# Patient Record
Sex: Female | Born: 1990 | Race: Black or African American | Hispanic: No | Marital: Single | Smoking: Former smoker
Health system: Southern US, Community
[De-identification: ages and names within clinical notes are randomized; demographics above are authoritative.]

## PROBLEM LIST (undated history)

## (undated) ENCOUNTER — Inpatient Hospital Stay (HOSPITAL_COMMUNITY): Payer: Self-pay

## (undated) DIAGNOSIS — O24419 Gestational diabetes mellitus in pregnancy, unspecified control: Secondary | ICD-10-CM

## (undated) DIAGNOSIS — N760 Acute vaginitis: Secondary | ICD-10-CM

## (undated) DIAGNOSIS — B9689 Other specified bacterial agents as the cause of diseases classified elsewhere: Secondary | ICD-10-CM

## (undated) DIAGNOSIS — O139 Gestational [pregnancy-induced] hypertension without significant proteinuria, unspecified trimester: Secondary | ICD-10-CM

## (undated) DIAGNOSIS — A599 Trichomoniasis, unspecified: Secondary | ICD-10-CM

## (undated) HISTORY — PX: DILATION AND CURETTAGE OF UTERUS: SHX78

## (undated) HISTORY — DX: Gestational (pregnancy-induced) hypertension without significant proteinuria, unspecified trimester: O13.9

---
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
## 2008-02-04 ENCOUNTER — Emergency Department (HOSPITAL_COMMUNITY): Admission: EM | Admit: 2008-02-04 | Discharge: 2008-02-04 | Payer: Self-pay | Admitting: Family Medicine

## 2008-06-28 ENCOUNTER — Observation Stay (HOSPITAL_COMMUNITY): Admission: EM | Admit: 2008-06-28 | Discharge: 2008-06-28 | Payer: Self-pay | Admitting: Emergency Medicine

## 2009-07-21 ENCOUNTER — Inpatient Hospital Stay (HOSPITAL_COMMUNITY): Admission: AD | Admit: 2009-07-21 | Discharge: 2009-07-21 | Payer: Self-pay | Admitting: Obstetrics & Gynecology

## 2009-08-18 ENCOUNTER — Inpatient Hospital Stay (HOSPITAL_COMMUNITY): Admission: AD | Admit: 2009-08-18 | Discharge: 2009-08-18 | Payer: Self-pay | Admitting: Obstetrics & Gynecology

## 2009-11-06 ENCOUNTER — Emergency Department (HOSPITAL_COMMUNITY): Admission: EM | Admit: 2009-11-06 | Discharge: 2009-11-06 | Payer: Self-pay | Admitting: Emergency Medicine

## 2009-11-08 ENCOUNTER — Emergency Department (HOSPITAL_COMMUNITY): Admission: EM | Admit: 2009-11-08 | Discharge: 2009-11-08 | Payer: Self-pay | Admitting: Emergency Medicine

## 2010-09-12 ENCOUNTER — Inpatient Hospital Stay (HOSPITAL_COMMUNITY)
Admission: AD | Admit: 2010-09-12 | Discharge: 2010-09-12 | Disposition: A | Discharge status: Home / Self Care | Payer: Medicaid Other | Source: Ambulatory Visit | Attending: Obstetrics and Gynecology | Admitting: Obstetrics and Gynecology

## 2010-09-12 ENCOUNTER — Inpatient Hospital Stay (HOSPITAL_COMMUNITY): Payer: Medicaid Other

## 2010-09-12 DIAGNOSIS — O021 Missed abortion: Secondary | ICD-10-CM | POA: Insufficient documentation

## 2010-09-12 LAB — DIFFERENTIAL
Basophils Relative: 0 % (ref 0–1)
Eosinophils Absolute: 0.1 10*3/uL (ref 0.0–0.7)
Monocytes Absolute: 0.7 10*3/uL (ref 0.1–1.0)
Monocytes Relative: 10 % (ref 3–12)
Neutrophils Relative %: 59 % (ref 43–77)

## 2010-09-12 LAB — CBC
HCT: 37.3 % (ref 36.0–46.0)
Hemoglobin: 12.5 g/dL (ref 12.0–15.0)
MCH: 30.7 pg (ref 26.0–34.0)
MCV: 91.6 fL (ref 78.0–100.0)
RBC: 4.07 MIL/uL (ref 3.87–5.11)

## 2010-09-12 LAB — URINALYSIS, ROUTINE W REFLEX MICROSCOPIC
Leukocytes, UA: NEGATIVE
Nitrite: POSITIVE — AB
Specific Gravity, Urine: >1.03 — ABNORMAL HIGH (ref 1.005–1.030)
Urobilinogen, UA: 0.2 mg/dL (ref 0.0–1.0)

## 2010-09-12 LAB — WET PREP, GENITAL
Trich, Wet Prep: NONE SEEN
Yeast Wet Prep HPF POC: NONE SEEN

## 2010-09-12 LAB — URINE MICROSCOPIC-ADD ON

## 2010-09-13 LAB — URINE CULTURE
Colony Count: 65000
Culture  Setup Time: 201203171746

## 2010-09-14 LAB — URINALYSIS, ROUTINE W REFLEX MICROSCOPIC
Bilirubin Urine: NEGATIVE
Glucose, UA: NEGATIVE mg/dL
Specific Gravity, Urine: 1.02 (ref 1.005–1.030)

## 2010-09-14 LAB — CBC
HCT: 34.4 % — ABNORMAL LOW (ref 36.0–46.0)
MCV: 95.6 fL (ref 78.0–100.0)
Platelets: 305 10*3/uL (ref 150–400)
RDW: 11.7 % (ref 11.5–15.5)

## 2010-09-14 LAB — URINE MICROSCOPIC-ADD ON

## 2010-09-14 LAB — ABO/RH: ABO/RH(D): A POS

## 2010-09-14 LAB — URINE CULTURE

## 2010-09-14 LAB — WET PREP, GENITAL

## 2010-09-14 LAB — POCT PREGNANCY, URINE: Preg Test, Ur: POSITIVE

## 2010-09-14 LAB — HCG, QUANTITATIVE, PREGNANCY: hCG, Beta Chain, Quant, S: 106262 m[IU]/mL — ABNORMAL HIGH (ref <5)

## 2010-09-15 LAB — DIFFERENTIAL
Eosinophils Absolute: 0 10*3/uL (ref 0.0–0.7)
Eosinophils Relative: 0 % (ref 0–5)
Lymphs Abs: 1 10*3/uL (ref 0.7–4.0)
Monocytes Absolute: 0.6 10*3/uL (ref 0.1–1.0)
Monocytes Relative: 11 % (ref 3–12)

## 2010-09-15 LAB — POCT URINALYSIS DIP (DEVICE)
Bilirubin Urine: NEGATIVE
Glucose, UA: NEGATIVE mg/dL
Nitrite: NEGATIVE
pH: 5.5 (ref 5.0–8.0)

## 2010-09-15 LAB — CBC
HCT: 41 % (ref 36.0–46.0)
MCV: 93.4 fL (ref 78.0–100.0)
Platelets: 287 10*3/uL (ref 150–400)
WBC: 5.8 10*3/uL (ref 4.0–10.5)

## 2010-09-15 LAB — WET PREP, GENITAL: Yeast Wet Prep HPF POC: NONE SEEN

## 2010-09-15 LAB — GC/CHLAMYDIA PROBE AMP, GENITAL: Chlamydia, DNA Probe: NEGATIVE

## 2010-09-16 LAB — GC/CHLAMYDIA PROBE AMP, GENITAL: GC Probe Amp, Genital: NEGATIVE

## 2010-09-17 ENCOUNTER — Ambulatory Visit (HOSPITAL_COMMUNITY)
Admission: AD | Admit: 2010-09-17 | Discharge: 2010-09-17 | Disposition: A | Discharge status: Home / Self Care | Payer: Medicaid Other | Source: Ambulatory Visit | Attending: Obstetrics & Gynecology | Admitting: Obstetrics & Gynecology

## 2010-09-17 ENCOUNTER — Other Ambulatory Visit: Payer: Self-pay | Admitting: Obstetrics and Gynecology

## 2010-09-17 ENCOUNTER — Inpatient Hospital Stay (HOSPITAL_COMMUNITY): Payer: Medicaid Other

## 2010-09-17 DIAGNOSIS — R52 Pain, unspecified: Secondary | ICD-10-CM

## 2010-09-17 DIAGNOSIS — O034 Incomplete spontaneous abortion without complication: Secondary | ICD-10-CM | POA: Insufficient documentation

## 2010-09-17 LAB — URINE CULTURE

## 2010-09-17 LAB — CBC
HCT: 34.4 % — ABNORMAL LOW (ref 36.0–46.0)
HCT: 28.2 % — ABNORMAL LOW (ref 36.0–46.0)
Hemoglobin: 11.5 g/dL — ABNORMAL LOW (ref 12.0–15.0)
MCH: 30.3 pg (ref 26.0–34.0)
MCH: 30.2 pg (ref 26.0–34.0)
MCHC: 33.4 g/dL (ref 30.0–36.0)
MCHC: 33 g/dL (ref 30.0–36.0)
MCV: 90.5 fL (ref 78.0–100.0)
RDW: 12.7 % (ref 11.5–15.5)

## 2010-09-17 LAB — GC/CHLAMYDIA PROBE AMP, GENITAL
Chlamydia, DNA Probe: NEGATIVE
GC Probe Amp, Genital: NEGATIVE

## 2010-09-17 LAB — URINALYSIS, ROUTINE W REFLEX MICROSCOPIC
Glucose, UA: NEGATIVE mg/dL
Ketones, ur: NEGATIVE mg/dL
Protein, ur: NEGATIVE mg/dL
pH: 7 (ref 5.0–8.0)

## 2010-09-17 LAB — TYPE AND SCREEN: DAT, IgG: NEGATIVE

## 2010-09-17 LAB — URINE MICROSCOPIC-ADD ON

## 2010-09-17 LAB — WET PREP, GENITAL
Trich, Wet Prep: NONE SEEN
Yeast Wet Prep HPF POC: NONE SEEN

## 2010-09-22 NOTE — Op Note (Signed)
  NAME:  [name], [name] NO.:  [id number]  MEDICAL RECORD NO.:  [id number]           PATIENT TYPE:  O  LOCATION:  [facility]                          [facility]  PHYSICIAN:  Carrington Clamp, M.D. DATE OF BIRTH:  1990-10-22  DATE OF PROCEDURE:  09/17/2010 DATE OF DISCHARGE:                              OPERATIVE REPORT   PREOPERATIVE DIAGNOSIS:  Incomplete abortion.  POSTOPERATIVE DIAGNOSIS:  Incomplete abortion.  PROCEDURE:  Dilation and evacuation.  SURGEON:  Carrington Clamp, MD  ASSISTANT:  None.  ANESTHESIA:  General LMA.  FINDINGS:  9-week size with open cervix down to 6 weeks with good crie.  SPECIMENS:  Products of conception to Pathology.  ESTIMATED BLOOD LOSS:  Minimal.  INTRAVENOUS FLUIDS:  700 cc.  URINE OUTPUT:  Normal.  COMPLICATIONS:  None.  MEDICATIONS:  Methergine and Ancef.  COUNTS:  Correct x3.  TECHNIQUE:  After adequate anesthesia was achieved, the patient was prepped and draped in the usual sterile fashion in dorsal supine position with dorsal lithotomy position.  The bladder was emptied with a red rubber catheter and the speculum was placed in the vagina.  Single- tooth tenaculum was used to stabilize the cervix.  The cervix was re- dilated to allow a 10-mm curette to pass through.  Alternating suction and sharp curettage was then performed until good crie was obtained.  Once bleeding was very minimal and all products were removed, the patient was given a shot of Methergine and all instruments were withdrawn from the vagina.  The patient tolerated the procedure well and was returned to the recovery room in stable condition.     Carrington Clamp, M.D.     MH/MEDQ  D:  09/17/2010  T:  09/18/2010  Job:  [id number]  Electronically Signed by Carrington Clamp MD on 09/22/2010 07:19:56 PM

## 2010-10-12 LAB — HEPATIC FUNCTION PANEL
AST: 17 U/L (ref 0–37)
Albumin: 3.9 g/dL (ref 3.5–5.2)
Total Bilirubin: 0.7 mg/dL (ref 0.3–1.2)
Total Protein: 7.2 g/dL (ref 6.0–8.3)

## 2010-10-12 LAB — BASIC METABOLIC PANEL
CO2: 22 mEq/L (ref 19–32)
Calcium: 8.5 mg/dL (ref 8.4–10.5)
Creatinine, Ser: 0.74 mg/dL (ref 0.4–1.2)
GFR calc Af Amer: >60 mL/min (ref >60)
Sodium: 142 mEq/L (ref 135–145)

## 2010-10-12 LAB — ETHANOL: Alcohol, Ethyl (B): 167 mg/dL — ABNORMAL HIGH (ref 0–10)

## 2010-10-12 LAB — RAPID URINE DRUG SCREEN, HOSP PERFORMED
Amphetamines: NOT DETECTED
Barbiturates: NOT DETECTED
Opiates: NOT DETECTED
Tetrahydrocannabinol: POSITIVE — AB

## 2010-10-23 NOTE — Consult Note (Signed)
[name], [name]                ACCOUNT NO.:  [id number]  MEDICAL RECORD NO.:  [id number]           PATIENT TYPE:  O  LOCATION:  [facility]                         [facility]  PHYSICIAN:  Willadean Guyton H. Tenny Craw, MD     DATE OF BIRTH:  [birth year]  DATE OF CONSULTATION:  09/12/2010 DATE OF DISCHARGE:  09/12/2010                                CONSULTATION   CHIEF COMPLAINT:  Cramping.  HISTORY OF PRESENT ILLNESS:  A 20 year old G2, P0-0-1-0 who should be at 14 weeks and 4 days.  Estimated gestational age by last menstrual period of approximately June 02, 2010 presented to maternity admissions with a chief complaint of cramping.  Fetal heart tones could not be auscultated.  Therefore, a transvaginal ultrasound was performed which demonstrated an intrauterine pregnancy with a crown-rump length consistent with 7 weeks and 6 days, however, no fetal cardiac activity was visualized consistent with an intrauterine fetal demise. Additionally, a urinalysis demonstrated positive nitrites and was consistent with a urinary tract infection.  PHYSICAL EXAMINATION:  Afebrile.  Vital signs stable.  Alert and oriented x3 in no apparent distress.  Abdomen is soft, nontender, nondistended.  LABORATORY DATA White blood cell count 6.8, hemoglobin 12.5, platelets 333,000. Urinalysis, trace blood, negative protein, positive nitrites, negative leukocyte esterase, many squamous epithelium, 3-6 white blood cells, 0-2 red blood cells, many bacteria.  On wet prep, bacteria were too numerous to count and few clue cells were noted.  The patient's blood type is A+. Ultrasound results were as above.  ASSESSMENT AND PLAN:  This is a 20 year old G2, P1-0-1-1 at 7 weeks and 6 days with a missed abortion.  Ultrasound findings were discussed with the patient and her partner.  Management options were reviewed including expectant management, medical management, and surgical management.  The patient believes she wants  to proceed with expectant management, however, was interested in receiving prescriptions for medical management should her body not naturally proceed with expulsion of products of conception.  She was given a prescription for Bactrim double- strength for a urinary tract infection.  She was given prescription for misoprostol 800 mcg for per vagina one.  She was instructed that she could repeat the dose in 48 hours if she did not have resultant expulsion of products of conception.  She was also advised that it can take up to 2 weeks after using the misoprostol for completion of the miscarriage.  She was given a prescription for Vicodin 5 mg 1-2 p.o. q.4- 6 h. as needed for pain with cramping.  She was instructed that she could also take ibuprofen 600 mg every 6 hours as needed for cramping. She was given bleeding precautions with passage of products of conception.  She was originally scheduled to be seen in the office on Monday March 19.  She will reschedule this appointment and follow up within 2 weeks after passage of products of conception.  She was advised to follow up within 4 weeks if she has not had passage of products of conception regardless of whether she chooses to use the misoprostol or use expectant management.     Enrique Sack  Imagene Sheller, MD     KHR/MEDQ  D:  09/12/2010  T:  09/13/2010  Job:  [id number]  Electronically Signed by Waynard Reeds MD on 10/23/2010 10:44:52 AM

## 2010-11-10 ENCOUNTER — Inpatient Hospital Stay (HOSPITAL_COMMUNITY)
Admission: AD | Admit: 2010-11-10 | Discharge: 2010-11-10 | Disposition: A | Discharge status: Home / Self Care | Payer: Medicaid Other | Source: Ambulatory Visit | Attending: Family Medicine | Admitting: Family Medicine

## 2010-11-10 DIAGNOSIS — N949 Unspecified condition associated with female genital organs and menstrual cycle: Secondary | ICD-10-CM

## 2010-11-10 DIAGNOSIS — B373 Candidiasis of vulva and vagina: Secondary | ICD-10-CM

## 2010-11-10 DIAGNOSIS — B3731 Acute candidiasis of vulva and vagina: Secondary | ICD-10-CM | POA: Insufficient documentation

## 2010-11-10 LAB — WET PREP, GENITAL
Clue Cells Wet Prep HPF POC: NONE SEEN
Trich, Wet Prep: NONE SEEN
Yeast Wet Prep HPF POC: NONE SEEN

## 2010-11-10 LAB — POCT PREGNANCY, URINE: Preg Test, Ur: NEGATIVE

## 2010-11-10 LAB — GC/CHLAMYDIA PROBE AMP, GENITAL: GC Probe Amp, Genital: NEGATIVE

## 2010-11-10 NOTE — H&P (Signed)
NAME:  [name], SLEE NO.:  [id number]   MEDICAL RECORD NO.:  [id number]          PATIENT TYPE:  EMS   LOCATION:  ED                           FACILITY:  [facility]   PHYSICIAN:  Ladell Pier, M.D.   DATE OF BIRTH:  July 02, 1990   DATE OF ADMISSION:  06/28/2008  DATE OF DISCHARGE:                              HISTORY & PHYSICAL   CHIEF COMPLAINT:  Altered mental status secondary to alcohol overdose.   HISTORY OF PRESENT ILLNESS:  The patient is a 20 year old African  American female.  History was obtained from her mother since the patient  was somnolent.  The patient was at home.  She became very upset with her  boyfriend.  She does not normally drink alcohol.  She drank a full  bottle of gin.  She just turned the bottle up to her head.  After that  she became very shaky and confused.  Because of that they took her to  the emergency room where she became very combative.  It took 10 people  to hold her down.  She was given 20 of Geodon and 2 mg of Versed.  The  patient is now very sedated and somnolent.  Asked to admit the patient  for observation.   PAST MEDICAL HISTORY:  None.   FAMILY HISTORY:  Noncontributory.   SOCIAL HISTORY:  The patient does smokes about three cigarettes per day.  Per drug screen she uses marijuana.  Normally no alcohol use.  She has a  boyfriend.  She is single.  She works at [employer].  No IV  drug use.  No children.   MEDICATIONS:  None.   ALLERGIES:  None.   REVIEW OF SYSTEMS:  Unable to obtain secondary to patient being  somnolent.   PHYSICAL EXAMINATION:  VITAL SIGNS:  Temperature 97, blood pressure  102/56, pulse of 99, respirations 16, pulse ox 100%.  GENERAL:  Patient  lying on the stretcher.  Does not seem to be in any acute distress.  She  moves her extremities but does not talk.  She groans to sternal rub.  HEENT:  Normocephalic, atraumatic.  Pupils reactive to light.  Throat is  unable to view secondary to the  patient would not open her mouth.  CARDIOVASCULAR:  Regular rate and rhythm.  LUNGS:  Clear bilaterally.  ABDOMEN:  Positive bowel sounds.  EXTREMITIES:  Without edema.  NEUROLOGIC:  The patient is able to move all extremities on neurologic  exam.   LABORATORY DATA:  Positive for marijuana.  Alcohol of 167.  Sodium 142,  chloride 109, CO2 22, BUN 25, creatinine 0.74, glucose 85.   ASSESSMENT/PLAN:  1. Alcohol intoxication/overdose.  2. Sedation secondary to medications.  3. Hypokalemia.  4. Marijuana use.   PLAN:  Will admit the patient to the step-down unit.  Will monitor her  for the next 24 hours until she recovers from her alcohol overdose.  The  patient will be evaluated by psychiatry and then can be discharged home  if cleared by psychiatry.      Ladell Pier, M.D.  Electronically  Signed     NJ/MEDQ  D:  06/28/2008  T:  06/28/2008  Job:  [id number]

## 2010-11-10 NOTE — Discharge Summary (Signed)
NAME:  [name], [name] NO.:  [id number]   MEDICAL RECORD NO.:  [id number]          PATIENT TYPE:  OBV   LOCATION:  0102                         FACILITY:  [facility]   PHYSICIAN:  Ladell Pier, M.D.   DATE OF BIRTH:  [birth year]   DATE OF ADMISSION:  06/28/2008  DATE OF DISCHARGE:                               DISCHARGE SUMMARY   ADDENDUM   The patient was not admitted to the hospital.  The patient left the  hospital from the emergency room.   DISCHARGE DIAGNOSES:  1. Alcohol overdose.  2. Altered mental status secondary to Geodon and Versed used for      agitation.  3. Hypokalemia.   DISCHARGE MEDICATIONS:  None.   FOLLOW-UP APPOINTMENTS:  Patient to follow up with primary care doctor  in 1 week.   HISTORY OF PRESENT ILLNESS:  As per the history and physical done, the  patient is a 20 year old African American female.  She was at home.  She  had a fight with her boyfriend.  She drank a bottle of gin.  The patient  stated that she was drinking because her boyfriend had left and to  celebrate the new year.   PAST MEDICAL HISTORY FAMILY HISTORY SOCIAL HISTORY MEDS ALLERGIES REVIEW  OF SYSTEM AND PHYSICAL EXAM:  See the admission H and P.   PHYSICAL EXAM ON DISCHARGE:  Temperature 97, blood pressure 102/56,  pulse 99, respirations 16, pulse of 100% on room air.  GENERAL:  The  patient is sitting up on stretcher, does not seem to be in any acute  distress.  HEENT:  Normocephalic, atraumatic.  Pupils reactive to light.  Throat  without erythema.  CARDIOVASCULAR:  Regular rate and rhythm.  LUNGS:  Clear bilaterally.  ABDOMEN:  Positive bowel sounds.  EXTREMITIES:  No edema.   HOSPITAL COURSE:  Alcohol intoxication/overdose/altered mental status:  We were asked to admit the patient secondary to alcohol overdose.  The  patient received Geodon and Versed in the ER and was sleepy for a few  hours.  After the alcohol wore off the patient woke up.  She was  alert,  oriented.  On talking with the patient, she stated that she was not  trying to hurt herself.  She was just drinking alcohol to celebrate the  new year.  She was mad at her boyfriend for leaving but she does not  think that is why she drank excess alcohol.  The patient will be  discharged home and she will follow up with her primary care doctor  within a week.   DISCHARGE LABS:  Alk phos 73, AST 17, ALT 14, albumin 3.9, sodium 130,  142, potassium 4.2, chloride 109, CO2 22,, glucose 85, BUN 5,  creatinine 0.74, calcium 8.5.  Alcohol level at admission was 167.  UDS  positive for tetrahydrocannabinol.  Urine pregnancy negative.      Ladell Pier, M.D.  Electronically Signed     NJ/MEDQ  D:  06/28/2008  T:  06/28/2008  Job:  [id number]

## 2012-09-05 ENCOUNTER — Encounter (HOSPITAL_COMMUNITY): Payer: Self-pay | Admitting: Obstetrics and Gynecology

## 2012-09-05 ENCOUNTER — Inpatient Hospital Stay (HOSPITAL_COMMUNITY)
Admission: AD | Admit: 2012-09-05 | Discharge: 2012-09-05 | Disposition: A | Discharge status: Home / Self Care | Payer: Medicaid Other | Source: Ambulatory Visit | Attending: Obstetrics & Gynecology | Admitting: Obstetrics & Gynecology

## 2012-09-05 DIAGNOSIS — R1032 Left lower quadrant pain: Secondary | ICD-10-CM

## 2012-09-05 DIAGNOSIS — A5901 Trichomonal vulvovaginitis: Secondary | ICD-10-CM | POA: Insufficient documentation

## 2012-09-05 DIAGNOSIS — R109 Unspecified abdominal pain: Secondary | ICD-10-CM | POA: Insufficient documentation

## 2012-09-05 HISTORY — DX: Trichomoniasis, unspecified: A59.9

## 2012-09-05 LAB — CBC WITH DIFFERENTIAL/PLATELET
Basophils Absolute: 0.1 10*3/uL (ref 0.0–0.1)
Basophils Relative: 1 % (ref 0–1)
Lymphocytes Relative: 40 % (ref 12–46)
MCHC: 33.7 g/dL (ref 30.0–36.0)
Neutro Abs: 2.8 10*3/uL (ref 1.7–7.7)
Neutrophils Relative %: 47 % (ref 43–77)
Platelets: 324 10*3/uL (ref 150–400)
RDW: 11.6 % (ref 11.5–15.5)
WBC: 5.9 10*3/uL (ref 4.0–10.5)

## 2012-09-05 LAB — URINALYSIS, ROUTINE W REFLEX MICROSCOPIC
Specific Gravity, Urine: >1.03 — ABNORMAL HIGH (ref 1.005–1.030)
Urobilinogen, UA: 2 mg/dL — ABNORMAL HIGH (ref 0.0–1.0)

## 2012-09-05 LAB — WET PREP, GENITAL: Yeast Wet Prep HPF POC: NONE SEEN

## 2012-09-05 LAB — URINE MICROSCOPIC-ADD ON

## 2012-09-05 LAB — POCT PREGNANCY, URINE: Preg Test, Ur: NEGATIVE

## 2012-09-05 MED ORDER — IBUPROFEN 600 MG PO TABS: 600.0000 mg | ORAL_TABLET | Freq: Once | ORAL | Status: DC

## 2012-09-05 MED ORDER — METRONIDAZOLE 500 MG PO TABS: 500.0000 mg | ORAL_TABLET | Freq: Two times a day (BID) | ORAL | Status: DC

## 2012-09-05 MED ORDER — KETOROLAC TROMETHAMINE 60 MG/2ML IM SOLN
30.0000 mg | Freq: Once | INTRAMUSCULAR | Status: AC
  Administered 2012-09-05: 30 mg via INTRAMUSCULAR
  Filled 2012-09-05: qty 2

## 2012-09-05 NOTE — MAU Note (Signed)
Can't keep nothing down, nauseated.  Having sharp pains in lower abd- same as when here before.

## 2012-09-05 NOTE — MAU Note (Signed)
Pt presents to MAU with complaints of Nausea and lower abdominal pain. Says she is unable to eat because it makes her nauseous. She has been able to sip on ginger ale. She is not pregnant

## 2012-09-05 NOTE — MAU Note (Signed)
Name and DOB verified, pt confirms spelling is correct on ID band. 

## 2012-09-05 NOTE — MAU Provider Note (Signed)
History     CSN: [id number]  Arrival date and time: 09/05/12 1431   First Provider Initiated Contact with Patient 09/05/12 1734      Chief Complaint  Patient presents with  . Abdominal Pain  . Nausea   HPI [name] is 22 y.o. G2P0020  presenting with nausea and vomiting since eating an egg roll 4 days ago.  She went to Urgent care but they are still without power and sent her here from their triage.   ? Fever that day but resolved with alka seltzer.  She has vomited X 3 in the past 4 days.  Worse pain on arrival was 6/10 and now 7/10.  Hasn't had anything for pain today.  She also experienced diarrhea X 2 the first day of sxs.  Has not been able to eat since.  Tried spaghetti last night with subsequent vomiting.  Has not eaten anything today.  Short period that began 08/28/12.  Was on birth control pills until last month because the [facility] changed her Rx from long cycles of 3 months to a pill that was monthly and she didn't like that.     Past Medical History  Diagnosis Date  . Trichomonas     Past Surgical History  Procedure Laterality Date  . Dilation and curettage of uterus      History reviewed. No pertinent family history.  History  Substance Use Topics  . Smoking status: Never Smoker   . Smokeless tobacco: Not on file  . Alcohol Use: No    Allergies:  Allergies  Allergen Reactions  . Latex Rash    In vaginal area    Prescriptions prior to admission  Medication Sig Dispense Refill  . Ascorbic Acid (VITAMIN C) 100 MG tablet Take 100 mg by mouth daily.        Review of Systems  Constitutional: Negative for fever and chills.  Eyes: Negative.   Respiratory: Negative.   Cardiovascular: Negative.   Gastrointestinal: Positive for nausea, vomiting and abdominal pain. Negative for diarrhea and constipation.  Genitourinary:       Neg for abnormal vaginal bleeding or discharge  Neurological: Negative for weakness and headaches.   Physical Exam   Blood  pressure 120/78, pulse 84, temperature 98.3 F (36.8 C), temperature source Oral, resp. rate 18, height 5\' 2"  (1.575 m), weight 156 lb (70.761 kg), last menstrual period 08/28/2012.  Physical Exam  Constitutional: She appears well-developed and well-nourished. No distress.  HENT:  Head: Normocephalic.  Neck: Normal range of motion.  Cardiovascular: Normal rate.   Respiratory: Effort normal.  GI: Soft. She exhibits no distension and no mass. There is tenderness (mild diffuse). There is no rebound and no guarding.  Genitourinary: There is no rash, tenderness or lesion on the right labia. There is no rash, tenderness or lesion on the left labia. Uterus is not enlarged and not tender. Cervix exhibits no motion tenderness, no discharge and no friability. Right adnexum displays no mass, no tenderness and no fullness. Left adnexum displays tenderness (mild). Left adnexum displays no mass and no fullness. No erythema or bleeding around the vagina. Vaginal discharge (white without odor) found.   Results for orders placed during the hospital encounter of 09/05/12 (from the past 24 hour(s))  URINALYSIS, ROUTINE W REFLEX MICROSCOPIC     Status: Abnormal   Collection Time    09/05/12  3:25 PM      Result Value Range   Color, Urine YELLOW  YELLOW   APPearance  CLEAR  CLEAR   Specific Gravity, Urine >1.030 (*) 1.005 - 1.030   pH 5.5  5.0 - 8.0   Glucose, UA NEGATIVE  NEGATIVE mg/dL   Hgb urine dipstick TRACE (*) NEGATIVE   Bilirubin Urine NEGATIVE  NEGATIVE   Ketones, ur 15 (*) NEGATIVE mg/dL   Protein, ur NEGATIVE  NEGATIVE mg/dL   Urobilinogen, UA 2.0 (*) 0.0 - 1.0 mg/dL   Nitrite NEGATIVE  NEGATIVE   Leukocytes, UA TRACE (*) NEGATIVE  URINE MICROSCOPIC-ADD ON     Status: Abnormal   Collection Time    09/05/12  3:25 PM      Result Value Range   Squamous Epithelial / LPF RARE  RARE   WBC, UA 11-20  <3 WBC/hpf   RBC / HPF 3-6  <3 RBC/hpf   Bacteria, UA FEW (*) RARE   Urine-Other MUCOUS PRESENT     POCT PREGNANCY, URINE     Status: None   Collection Time    09/05/12  3:36 PM      Result Value Range   Preg Test, Ur NEGATIVE  NEGATIVE  CBC WITH DIFFERENTIAL     Status: Abnormal   Collection Time    09/05/12  5:45 PM      Result Value Range   WBC 5.9  4.0 - 10.5 K/uL   RBC 3.93  3.87 - 5.11 MIL/uL   Hemoglobin 12.0  12.0 - 15.0 g/dL   HCT 09.8 (*) 11.9 - 14.7 %   MCV 90.6  78.0 - 100.0 fL   MCH 30.5  26.0 - 34.0 pg   MCHC 33.7  30.0 - 36.0 g/dL   RDW 82.9  56.2 - 13.0 %   Platelets 324  150 - 400 K/uL   Neutrophils Relative 47  43 - 77 %   Neutro Abs 2.8  1.7 - 7.7 K/uL   Lymphocytes Relative 40  12 - 46 %   Lymphs Abs 2.4  0.7 - 4.0 K/uL   Monocytes Relative 12  3 - 12 %   Monocytes Absolute 0.7  0.1 - 1.0 K/uL   Eosinophils Relative 1  0 - 5 %   Eosinophils Absolute 0.1  0.0 - 0.7 K/uL   Basophils Relative 1  0 - 1 %   Basophils Absolute 0.1  0.0 - 0.1 K/uL  WET PREP, GENITAL     Status: Abnormal   Collection Time    09/05/12  6:02 PM      Result Value Range   Yeast Wet Prep HPF POC NONE SEEN  NONE SEEN   Trich, Wet Prep MODERATE (*) NONE SEEN   Clue Cells Wet Prep HPF POC FEW (*) NONE SEEN   WBC, Wet Prep HPF POC FEW (*) NONE SEEN   MAU Course  Procedures  Gc/CHl culture to lab Toradol 30mg  IM for pain  MDM 18:30  Patient states her pain is easing.  Will discharge at this time. Assessment and Plan  A:  Abdominal pain     Trichomonas vaginitis      Possibly ovulatory pain  P:  Patient instructed to call [facility] tomorrow for appointment to revisit oral contraception      Instructed not to take anymore Ibuprofen until tomorrow.      Rx for Flagyl 500mg  bid X 1 week, discussed need for partner to be treated.         KEY,EVE M 09/05/2012, 6:32 PM

## 2012-09-07 LAB — URINE CULTURE
Colony Count: NO GROWTH
Culture: NO GROWTH

## 2012-09-07 NOTE — MAU Provider Note (Signed)

## 2012-10-12 ENCOUNTER — Emergency Department (INDEPENDENT_AMBULATORY_CARE_PROVIDER_SITE_OTHER)
Admission: EM | Admit: 2012-10-12 | Discharge: 2012-10-12 | Disposition: A | Discharge status: Home / Self Care | Payer: Self-pay | Source: Home / Self Care | Attending: Family Medicine | Admitting: Family Medicine

## 2012-10-12 ENCOUNTER — Other Ambulatory Visit (HOSPITAL_COMMUNITY)
Admission: RE | Admit: 2012-10-12 | Discharge: 2012-10-12 | Disposition: A | Discharge status: Home / Self Care | Payer: Medicaid Other | Source: Ambulatory Visit | Attending: Family Medicine | Admitting: Family Medicine

## 2012-10-12 ENCOUNTER — Encounter (HOSPITAL_COMMUNITY): Payer: Self-pay | Admitting: Emergency Medicine

## 2012-10-12 DIAGNOSIS — B373 Candidiasis of vulva and vagina: Secondary | ICD-10-CM

## 2012-10-12 DIAGNOSIS — N760 Acute vaginitis: Secondary | ICD-10-CM | POA: Insufficient documentation

## 2012-10-12 DIAGNOSIS — Z113 Encounter for screening for infections with a predominantly sexual mode of transmission: Secondary | ICD-10-CM | POA: Insufficient documentation

## 2012-10-12 LAB — POCT URINALYSIS DIP (DEVICE)
Glucose, UA: NEGATIVE mg/dL
Nitrite: NEGATIVE
Specific Gravity, Urine: >=1.03 (ref 1.005–1.030)
Urobilinogen, UA: 4 mg/dL — ABNORMAL HIGH (ref 0.0–1.0)
pH: 6 (ref 5.0–8.0)

## 2012-10-12 MED ORDER — FLUCONAZOLE 200 MG PO TABS: 200.0000 mg | ORAL_TABLET | Freq: Every day | ORAL | Status: AC

## 2012-10-12 NOTE — ED Provider Notes (Signed)
History     CSN: [id number]  Arrival date & time 10/12/12  1116   First MD Initiated Contact with Patient 10/12/12 1155      Chief Complaint  Patient presents with  . Rash    (Consider location/radiation/quality/duration/timing/severity/associated sxs/prior treatment) Patient is a 22 y.o. female presenting with rash. The history is provided by the patient.  Rash Location:  Ano-genital Ano-genital rash location:  Vulva Quality: burning, itchiness and redness   Severity:  Moderate Onset quality:  Gradual Duration:  3 weeks Progression:  Unchanged Chronicity:  New Context: new detergent/soap   Relieved by:  Nothing Ineffective treatments:  Anti-fungal cream Associated symptoms: no abdominal pain, no fever and no nausea   Pt reports > 3 week h/o vaginal itching. Vaginal itching has been associated with what patient believes to be a rash to her vaginal/vulvar area. States she had a Pap smear and complete annual exam in March. All cultures and wet prep were negative. Patient states she has tried over-the-counter antifungal creams without relief.   Past Medical History  Diagnosis Date  . Trichomonas     Past Surgical History  Procedure Laterality Date  . Dilation and curettage of uterus      No family history on file.  History  Substance Use Topics  . Smoking status: Never Smoker   . Smokeless tobacco: Not on file  . Alcohol Use: No    OB History   Grav Para Term Preterm Abortions TAB SAB Ect Mult Living   2 0 0  2 1 1    0      Review of Systems  Constitutional: Negative for fever.  Gastrointestinal: Negative for nausea and abdominal pain.  Skin: Positive for rash.  All other systems reviewed and are negative.    Allergies  Latex  Home Medications   Current Outpatient Rx  Name  Route  Sig  Dispense  Refill  . Ascorbic Acid (VITAMIN C) 100 MG tablet   Oral   Take 100 mg by mouth daily.         . metroNIDAZOLE (FLAGYL) 500 MG tablet   Oral   Take  1 tablet (500 mg total) by mouth 2 (two) times daily.   14 tablet   0     BP 118/72  Pulse 80  Temp(Src) 98.4 F (36.9 C) (Oral)  SpO2 98%  LMP 09/28/2012  Physical Exam  Constitutional: She is oriented to person, place, and time. She appears well-developed and well-nourished.  HENT:  Head: Normocephalic and atraumatic.  Eyes: Conjunctivae are normal.  Neck: Neck supple.  Cardiovascular: Normal rate.   Pulmonary/Chest: Effort normal.  Abdominal: Hernia confirmed negative in the right inguinal area and confirmed negative in the left inguinal area.  Genitourinary: Uterus normal. There is no rash, tenderness, lesion or injury on the right labia. There is no rash, tenderness or lesion on the left labia. Cervix exhibits no motion tenderness, no discharge and no friability. Right adnexum displays no mass, no tenderness and no fullness. Left adnexum displays no mass, no tenderness and no fullness. No erythema, tenderness or bleeding around the vagina. No foreign body around the vagina. No signs of injury around the vagina. Vaginal discharge found.  Thick white vaginal discharge c/w vaginal candidiasis. Vulva noted with mild erythema but no obvious rash.  Musculoskeletal: Normal range of motion.  Lymphadenopathy:       Right: No inguinal adenopathy present.       Left: No inguinal adenopathy present.  Neurological: She  is alert and oriented to person, place, and time.  Skin: Skin is warm and dry.  Psychiatric: She has a normal mood and affect.    ED Course  Pelvic exam Date/Time: 10/12/2012 1:36 PM Performed by: Leanne Chang Authorized by: Sharin Grave Consent: Verbal consent obtained. Risks and benefits: risks, benefits and alternatives were discussed Consent given by: patient Patient understanding: patient states understanding of the procedure being performed Required items: required blood products, implants, devices, and special equipment available Patient identity  confirmed: verbally with patient and arm band Local anesthesia used: no Patient sedated: no Patient tolerance: Patient tolerated the procedure well with no immediate complications.   (including critical care time)  Labs Reviewed - No data to display No results found.   No diagnosis found.    MDM  Patient reports several weeks of persistent vaginal itching. Was told at recent annual exam to change her perfumed soap. During exam in March cultures and wet prep unremarkable. Patient states she changed her soap but symptoms have persisted. Patient believed she noted a rash to her vulvar/labia. PE findings consistent with vaginal candidiasis. Some mild external irritation and erythema but no obvious external rash or lesions noted. Patient reports over-the-counter antifungal creams have not provided relief. Will  treat with Diflucan daily x3. Patient agreeable to plan.        Leanne Chang, NP 10/12/12 1340

## 2012-10-12 NOTE — ED Provider Notes (Signed)
Medical screening examination/treatment/procedure(s) were performed by non-physician practitioner and as supervising physician I was immediately available for consultation/collaboration.   MORENO-COLL,Kathlean Cinco; MD  Wateen Varon Moreno-Coll, MD 10/12/12 2050 

## 2012-10-12 NOTE — ED Notes (Signed)
Pt c/o rash on vagina x 2 weeks noticed it after using new hygiene products. Pt reports she thought is was a yeast infection so she used an OTC cream which only made it worst and spread. Pt states rash is itchy but not painful. Took Aleve with no relief. Patient is alert and oriented.

## 2012-10-13 ENCOUNTER — Telehealth (HOSPITAL_COMMUNITY): Payer: Self-pay | Admitting: *Deleted

## 2012-10-13 NOTE — ED Notes (Signed)
Pharmacist from [facility] on [address], called and said pt. called him. They don't have her Rx. I accessed pt.'s chart and told him her Rx. Of Diflucan was printed and pt. should have it with her d/c instructions. Probably on the last page. He said he would call her and tell her.  Vassie Moselle 10/13/2012

## 2013-04-06 ENCOUNTER — Inpatient Hospital Stay (HOSPITAL_COMMUNITY)
Admission: AD | Admit: 2013-04-06 | Discharge: 2013-04-06 | Disposition: A | Discharge status: Home / Self Care | Payer: Medicaid Other | Source: Ambulatory Visit | Attending: Obstetrics & Gynecology | Admitting: Obstetrics & Gynecology

## 2013-04-06 ENCOUNTER — Inpatient Hospital Stay (HOSPITAL_COMMUNITY): Payer: Medicaid Other

## 2013-04-06 ENCOUNTER — Encounter (HOSPITAL_COMMUNITY): Payer: Self-pay | Admitting: *Deleted

## 2013-04-06 DIAGNOSIS — O2390 Unspecified genitourinary tract infection in pregnancy, unspecified trimester: Secondary | ICD-10-CM | POA: Insufficient documentation

## 2013-04-06 DIAGNOSIS — O26899 Other specified pregnancy related conditions, unspecified trimester: Secondary | ICD-10-CM

## 2013-04-06 DIAGNOSIS — R1012 Left upper quadrant pain: Secondary | ICD-10-CM | POA: Insufficient documentation

## 2013-04-06 DIAGNOSIS — A499 Bacterial infection, unspecified: Secondary | ICD-10-CM | POA: Insufficient documentation

## 2013-04-06 DIAGNOSIS — R109 Unspecified abdominal pain: Secondary | ICD-10-CM

## 2013-04-06 DIAGNOSIS — B9689 Other specified bacterial agents as the cause of diseases classified elsewhere: Secondary | ICD-10-CM | POA: Insufficient documentation

## 2013-04-06 DIAGNOSIS — N760 Acute vaginitis: Secondary | ICD-10-CM | POA: Insufficient documentation

## 2013-04-06 DIAGNOSIS — O9989 Other specified diseases and conditions complicating pregnancy, childbirth and the puerperium: Secondary | ICD-10-CM

## 2013-04-06 LAB — WET PREP, GENITAL
Trich, Wet Prep: NONE SEEN
Yeast Wet Prep HPF POC: NONE SEEN

## 2013-04-06 LAB — URINALYSIS, ROUTINE W REFLEX MICROSCOPIC
Glucose, UA: NEGATIVE mg/dL
Hgb urine dipstick: NEGATIVE
Ketones, ur: NEGATIVE mg/dL
Leukocytes, UA: NEGATIVE
Protein, ur: NEGATIVE mg/dL
pH: 6 (ref 5.0–8.0)

## 2013-04-06 LAB — CBC
HCT: 34.6 % — ABNORMAL LOW (ref 36.0–46.0)
Hemoglobin: 11.9 g/dL — ABNORMAL LOW (ref 12.0–15.0)
RBC: 3.85 MIL/uL — ABNORMAL LOW (ref 3.87–5.11)

## 2013-04-06 LAB — POCT PREGNANCY, URINE: Preg Test, Ur: POSITIVE — AB

## 2013-04-06 MED ORDER — METRONIDAZOLE 500 MG PO TABS: 500.0000 mg | ORAL_TABLET | Freq: Two times a day (BID) | ORAL | Status: DC

## 2013-04-06 NOTE — MAU Note (Signed)
Abdominal cramping for a week on left mid stomach. Comes and goes. Denies abd bleeding or d/c

## 2013-04-06 NOTE — MAU Provider Note (Signed)
History     CSN: [id number]  Arrival date and time: 04/06/13 1913   None     Chief Complaint  Patient presents with  . Abdominal Cramping   HPI This is a 22 y.o. female who presents with c/o intermittent pain in LUQ.  ALso had some cramps over the past week. No bleeding. [facility] and they would not see her due to positive pregnancy test. Did not know she was pregnant   Has appt at [facility] but has not been there yet   RN Note: Abdominal cramping for a week on left mid stomach. Comes and goes. Denies abd bleeding or d/c       OB History   Grav Para Term Preterm Abortions TAB SAB Ect Mult Living   3 0 0  2 1 1    0      Past Medical History  Diagnosis Date  . Trichomonas   . Medical history non-contributory     Past Surgical History  Procedure Laterality Date  . Dilation and curettage of uterus      Family History  Problem Relation Age of Onset  . Cancer Brother   . Hypertension Maternal Grandfather   . Diabetes Brother     History  Substance Use Topics  . Smoking status: Light Tobacco Smoker  . Smokeless tobacco: Not on file  . Alcohol Use: No    Allergies:  Allergies  Allergen Reactions  . Latex Rash    In vaginal area    Prescriptions prior to admission  Medication Sig Dispense Refill  . Ascorbic Acid (VITAMIN C) 100 MG tablet Take 100 mg by mouth daily.      . metroNIDAZOLE (FLAGYL) 500 MG tablet Take 1 tablet (500 mg total) by mouth 2 (two) times daily.  14 tablet  0    Review of Systems  Constitutional: Negative for fever, chills and malaise/fatigue.  Gastrointestinal: Positive for abdominal pain. Negative for nausea, vomiting, diarrhea and constipation.  Genitourinary: Negative for dysuria.  Neurological: Negative for dizziness and weakness.   Physical Exam   Blood pressure 125/70, pulse 91, temperature 98.9 F (37.2 C), resp. rate 20, height 5\' 3"  (1.6 m), weight 165 lb 3.2 oz (74.934 kg), last menstrual period  03/06/2013.  Physical Exam  Constitutional: She is oriented to person, place, and time. She appears well-developed and well-nourished. No distress.  Cardiovascular: Normal rate.   Respiratory: Effort normal.  GI: Soft. She exhibits no distension. There is no tenderness. There is no rebound and no guarding.  Genitourinary: Vagina normal and uterus normal. No vaginal discharge found.  Cervix long and closed Uterus small and nontender   Musculoskeletal: Normal range of motion.  Neurological: She is alert and oriented to person, place, and time.  Skin: Skin is warm and dry.  Psychiatric: She has a normal mood and affect.   Results for orders placed during the hospital encounter of 04/06/13 (from the past 24 hour(s))  URINALYSIS, ROUTINE W REFLEX MICROSCOPIC     Status: Abnormal   Collection Time    04/06/13  7:50 PM      Result Value Range   Color, Urine YELLOW  YELLOW   APPearance CLEAR  CLEAR   Specific Gravity, Urine >1.030 (*) 1.005 - 1.030   pH 6.0  5.0 - 8.0   Glucose, UA NEGATIVE  NEGATIVE mg/dL   Hgb urine dipstick NEGATIVE  NEGATIVE   Bilirubin Urine NEGATIVE  NEGATIVE   Ketones, ur NEGATIVE  NEGATIVE mg/dL  Protein, ur NEGATIVE  NEGATIVE mg/dL   Urobilinogen, UA 2.0 (*) 0.0 - 1.0 mg/dL   Nitrite NEGATIVE  NEGATIVE   Leukocytes, UA NEGATIVE  NEGATIVE  POCT PREGNANCY, URINE     Status: Abnormal   Collection Time    04/06/13  8:09 PM      Result Value Range   Preg Test, Ur POSITIVE (*) NEGATIVE  CBC     Status: Abnormal   Collection Time    04/06/13  8:21 PM      Result Value Range   WBC 8.9  4.0 - 10.5 K/uL   RBC 3.85 (*) 3.87 - 5.11 MIL/uL   Hemoglobin 11.9 (*) 12.0 - 15.0 g/dL   HCT 47.8 (*) 29.5 - 62.1 %   MCV 89.9  78.0 - 100.0 fL   MCH 30.9  26.0 - 34.0 pg   MCHC 34.4  30.0 - 36.0 g/dL   RDW 30.8  65.7 - 84.6 %   Platelets 355  150 - 400 K/uL  HCG, QUANTITATIVE, PREGNANCY     Status: Abnormal   Collection Time    04/06/13  8:21 PM      Result Value  Range   hCG, Beta Chain, Quant, S 953 (*) <5 mIU/mL  WET PREP, GENITAL     Status: Abnormal   Collection Time    04/06/13  8:30 PM      Result Value Range   Yeast Wet Prep HPF POC NONE SEEN  NONE SEEN   Trich, Wet Prep NONE SEEN  NONE SEEN   Clue Cells Wet Prep HPF POC MODERATE (*) NONE SEEN   WBC, Wet Prep HPF POC FEW (*) NONE SEEN    US Ob Comp Less 14 Wks  04/06/2013   *RADIOLOGY REPORT*  Clinical Data: Left pelvic pain.  OBSTETRIC <14 WK [place] AND TRANSVAGINAL OB US  Technique:  Both transabdominal and transvaginal ultrasound examinations were performed for complete evaluation of the gestation as well as the maternal uterus, adnexal regions, and pelvic cul-de-sac.  Transvaginal technique was performed to assess early pregnancy.  Comparison:  None.  Intrauterine gestational sac:  Probable intrauterine gestational sac noted Yolk sac: No Embryo: No Cardiac Activity: N/A  MSD: 2.6 mm  4 w 5 d  Maternal uterus/adnexae: No subchorionic hemorrhage is seen.  The uterus is otherwise unremarkable appearance.  The ovaries are unremarkable.  The right ovary measures 3.6 x 1.9 x 2.1 cm, while the left ovary measures 2.0 x 1.1 x 1.6 cm.  No suspicious adnexal masses are seen; there is no evidence of ovarian torsion.  A small cystic focus adjacent to the left ovary remains nonspecific at this time.  No free fluid is seen within the pelvic cul-de-sac.  IMPRESSION: Probable early intrauterine gestational sac, but no yolk sac, fetal pole, or cardiac activity yet visualized.  Recommend follow-up quantitative B-HCG levels and follow-up US in 14 days to confirm and assess viability. This recommendation follows SRU consensus guidelines: Diagnostic Criteria for Nonviable Pregnancy Early in the First Trimester.  Malva Limes Med 2013; 962:9528-41.   Original Report Authenticated By: Tonia Ghent, M.D.   US Ob Transvaginal  04/06/2013   *RADIOLOGY REPORT*  Clinical Data: Left pelvic pain.  OBSTETRIC <14 WK [place] AND  TRANSVAGINAL OB US  Technique:  Both transabdominal and transvaginal ultrasound examinations were performed for complete evaluation of the gestation as well as the maternal uterus, adnexal regions, and pelvic cul-de-sac.  Transvaginal technique was performed to assess early pregnancy.  Comparison:  None.  Intrauterine gestational sac:  Probable intrauterine gestational sac noted Yolk sac: No Embryo: No Cardiac Activity: N/A  MSD: 2.6 mm  4 w 5 d  Maternal uterus/adnexae: No subchorionic hemorrhage is seen.  The uterus is otherwise unremarkable appearance.  The ovaries are unremarkable.  The right ovary measures 3.6 x 1.9 x 2.1 cm, while the left ovary measures 2.0 x 1.1 x 1.6 cm.  No suspicious adnexal masses are seen; there is no evidence of ovarian torsion.  A small cystic focus adjacent to the left ovary remains nonspecific at this time.  No free fluid is seen within the pelvic cul-de-sac.  IMPRESSION: Probable early intrauterine gestational sac, but no yolk sac, fetal pole, or cardiac activity yet visualized.  Recommend follow-up quantitative B-HCG levels and follow-up US in 14 days to confirm and assess viability. This recommendation follows SRU consensus guidelines: Diagnostic Criteria for Nonviable Pregnancy Early in the First Trimester.  Malva Limes Med 2013; 161:0960-45.   Original Report Authenticated By: Tonia Ghent, M.D.    MAU Course  Procedures  MDM Cultures  Done. Quant and CBC ordered. A+ blood type from previous visit in Epic Assessment and Plan  Report to oncoming PA 2110 - Patient waiting for [place]. Care assumed from Wynelle Bourgeois, CNM  A: Probably IUGS without YS or FP Bacterial Vaginosis  P: Discharge home Rx for Flagyl sent to patient's pharmacy Bleeding/Ectopic precautions reviewed Patient to return to MAU in 48 hours for follow-up quant hCG Patient may return to MAU sooner as needed  Freddi Starr, PA-C  04/06/2013, 10:03 PM

## 2013-04-07 LAB — GC/CHLAMYDIA PROBE AMP: GC Probe RNA: NEGATIVE

## 2013-04-08 ENCOUNTER — Inpatient Hospital Stay (HOSPITAL_COMMUNITY)
Admission: AD | Admit: 2013-04-08 | Discharge: 2013-04-08 | Disposition: A | Discharge status: Home / Self Care | Payer: BC Managed Care – PPO | Source: Ambulatory Visit | Attending: Obstetrics & Gynecology | Admitting: Obstetrics & Gynecology

## 2013-04-08 DIAGNOSIS — O26899 Other specified pregnancy related conditions, unspecified trimester: Secondary | ICD-10-CM

## 2013-04-08 DIAGNOSIS — R109 Unspecified abdominal pain: Secondary | ICD-10-CM | POA: Insufficient documentation

## 2013-04-08 DIAGNOSIS — O99891 Other specified diseases and conditions complicating pregnancy: Secondary | ICD-10-CM | POA: Insufficient documentation

## 2013-04-08 LAB — HCG, QUANTITATIVE, PREGNANCY: hCG, Beta Chain, Quant, S: 2127 m[IU]/mL — ABNORMAL HIGH (ref <5)

## 2013-04-08 NOTE — MAU Note (Signed)
Patient to MAU for repeat BHCG  Patient denies bleeding but does have a little cramping.

## 2013-04-08 NOTE — MAU Provider Note (Signed)
  History     CSN: [id number]  Arrival date and time: 04/08/13 1542   None     Chief Complaint  Patient presents with  . Follow-up   HPI [facility] is a 22 y.o. G3P0020 at [redacted]w[redacted]d. She presents for f/u BHCG. 1st MAU visit was 10/10 for LLQ pain. BHCG was 953, U/S showed probable IUGS, no yolk sac or fetal pole, no adnexal masses. Today her pain is low bil cramping off/on, no bleeding or dizziness.  OB History   Grav Para Term Preterm Abortions TAB SAB Ect Mult Living   3 0 0  2 1 1    0      Past Medical History  Diagnosis Date  . Trichomonas   . Medical history non-contributory     Past Surgical History  Procedure Laterality Date  . Dilation and curettage of uterus      Family History  Problem Relation Age of Onset  . Cancer Brother   . Hypertension Maternal Grandfather   . Diabetes Brother     History  Substance Use Topics  . Smoking status: Light Tobacco Smoker  . Smokeless tobacco: Not on file  . Alcohol Use: No    Allergies:  Allergies  Allergen Reactions  . Latex Rash    In vaginal area    Prescriptions prior to admission  Medication Sig Dispense Refill  . Ascorbic Acid (VITAMIN C) 100 MG tablet Take 100 mg by mouth daily.      . metroNIDAZOLE (FLAGYL) 500 MG tablet Take 1 tablet (500 mg total) by mouth 2 (two) times daily.  14 tablet  0  . metroNIDAZOLE (FLAGYL) 500 MG tablet Take 1 tablet (500 mg total) by mouth 2 (two) times daily.  14 tablet  0    Review of Systems  Constitutional: Negative for fever and chills.  Gastrointestinal: Positive for abdominal pain.  Genitourinary:       No bleeding or spotting  Neurological: Negative for dizziness and weakness.   Physical Exam   Blood pressure 117/67, pulse 91, temperature 98.9 F (37.2 C), temperature source Oral, resp. rate 16, last menstrual period 03/06/2013.  Physical Exam  Constitutional: She appears well-developed and well-nourished.  Psychiatric: She has a normal mood and affect.  Her behavior is normal.    MAU Course  Procedures  MDM Results for orders placed during the hospital encounter of 04/08/13 (from the past 24 hour(s))  HCG, QUANTITATIVE, PREGNANCY     Status: Abnormal   Collection Time    04/08/13  4:00 PM      Result Value Range   hCG, Beta Chain, Quant, S 2127 (*) <5 mIU/mL     Assessment and Plan  ASSESSMENT: BHCG has increased from 953 to 2,127  PLAN:  Pelvic U/S 10/20- order in, they will call her Ectopic precautions reviewed, return prn  Steffon Gladu M. 04/08/2013, 4:47 PM

## 2013-04-12 ENCOUNTER — Ambulatory Visit (HOSPITAL_COMMUNITY)
Admission: RE | Admit: 2013-04-12 | Discharge: 2013-04-12 | Disposition: A | Discharge status: Home / Self Care | Payer: BC Managed Care – PPO | Source: Ambulatory Visit | Attending: Obstetrics and Gynecology | Admitting: Obstetrics and Gynecology

## 2013-04-12 DIAGNOSIS — O99891 Other specified diseases and conditions complicating pregnancy: Secondary | ICD-10-CM | POA: Insufficient documentation

## 2013-04-12 DIAGNOSIS — R1032 Left lower quadrant pain: Secondary | ICD-10-CM | POA: Insufficient documentation

## 2013-04-13 ENCOUNTER — Encounter: Payer: Self-pay | Admitting: Obstetrics & Gynecology

## 2013-04-13 LAB — US OB COMP LESS 14 WKS

## 2013-04-23 ENCOUNTER — Encounter: Payer: Self-pay | Admitting: Obstetrics

## 2013-05-03 ENCOUNTER — Ambulatory Visit (INDEPENDENT_AMBULATORY_CARE_PROVIDER_SITE_OTHER): Payer: BC Managed Care – PPO | Admitting: Obstetrics & Gynecology

## 2013-05-03 ENCOUNTER — Encounter: Payer: Self-pay | Admitting: Obstetrics & Gynecology

## 2013-05-03 VITALS — BP 119/77 | Temp 98.6°F | Wt 164.0 lb

## 2013-05-03 DIAGNOSIS — Z3493 Encounter for supervision of normal pregnancy, unspecified, third trimester: Secondary | ICD-10-CM | POA: Insufficient documentation

## 2013-05-03 DIAGNOSIS — Z3201 Encounter for pregnancy test, result positive: Secondary | ICD-10-CM

## 2013-05-03 DIAGNOSIS — Z3401 Encounter for supervision of normal first pregnancy, first trimester: Secondary | ICD-10-CM

## 2013-05-03 LAB — POCT URINALYSIS DIPSTICK
Bilirubin, UA: NEGATIVE
Ketones, UA: NEGATIVE
Nitrite, UA: NEGATIVE
Spec Grav, UA: 1.02
pH, UA: 6

## 2013-05-03 NOTE — Progress Notes (Signed)
.   Subjective:    [name] is being seen today for her first obstetrical visit.  This is not a planned pregnancy. She is at [redacted]w[redacted]d gestation.  Relationship with FOB: significant other, not living together. Patient does intend to breast feed. Pregnancy history fully reviewed.  Menstrual History: OB History   Grav Para Term Preterm Abortions TAB SAB Ect Mult Living   3 0 0  2 0 1   0      Menarche age: 22  Patient's last menstrual period was 03/06/2013.    The following portions of the patient's history were reviewed and updated as appropriate: allergies, current medications, past family history, past medical history, past social history, past surgical history and problem list.  Review of Systems Pertinent items are noted in HPI.    Objective:   General Appearance:    Alert, cooperative, no distress, appears stated age  Head:    Normocephalic, without obvious abnormality, atraumatic  Eyes:    PERRL, conjunctiva/corneas clear, EOM's intact, fundi    benign, both eyes  Ears:    Normal TM's and external ear canals, both ears  Nose:   Nares normal, septum midline, mucosa normal, no drainage    or sinus tenderness  Throat:   Lips, mucosa, and tongue normal; teeth and gums normal  Neck:   Supple, symmetrical, trachea midline, no adenopathy;    thyroid:  no enlargement/tenderness/nodules; no carotid   bruit or JVD  Back:     Symmetric, no curvature, ROM normal, no CVA tenderness  Lungs:     Clear to auscultation bilaterally, respirations unlabored  Chest Wall:    No tenderness or deformity   Heart:    Regular rate and rhythm, S1 and S2 normal, no murmur, rub   or gallop  Breast Exam:    No tenderness, masses, or nipple abnormality  Abdomen:     Soft, non-tender, bowel sounds active all four quadrants,    no masses, no organomegaly  Genitalia:    Normal female without lesion, discharge or tenderness  Extremities:   Extremities normal, atraumatic, no cyanosis or edema  Pulses:   2+  and symmetric all extremities  Skin:   Skin color, texture, turgor normal, no rashes or lesions  Lymph nodes:   Cervical, supraclavicular, and axillary nodes normal  Neurologic:   CNII-XII intact, normal strength, sensation and reflexes    throughout    Assessment:    Pregnancy at [redacted]w[redacted]d weeks    Plan:    Initial labs drawn. Prenatal vitamins.  Counseling provided regarding continued use of seat belts, cessation of alcohol consumption, smoking or use of illicit drugs; infection precautions i.e., influenza/TDAP immunizations, toxoplasmosis,CMV, parvovirus, listeria and varicella; workplace safety, exercise during pregnancy; routine dental care, safe medications, sexual activity, hot tubs, saunas, pools, travel, caffeine use, fish and methlymercury, potential toxins, hair treatments, varicose veins Weight gain recommendations reviewed:  overweight/BMI 25 - 29.9--> gain 15 - 25 lbs Problem list reviewed and updated. FIRST/ CF mutation testing discussed Role of ultrasound in pregnancy discussed; fetal survey. Amniocentesis discussed: not indicated. Follow up in 4 weeks. 50% of 20 min visit spent on counseling and coordination of care.

## 2013-05-03 NOTE — Progress Notes (Signed)
HR - 88 New OB patient in office today, states she has been having some spotting , states that she noted a few red drops in toilet , states it was pink when she wiped, lasted from Sunday - Tuesday

## 2013-05-04 LAB — OBSTETRIC PANEL
Basophils Relative: 0 % (ref 0–1)
Eosinophils Absolute: 0.1 10*3/uL (ref 0.0–0.7)
Hemoglobin: 12 g/dL (ref 12.0–15.0)
Hepatitis B Surface Ag: NEGATIVE
Lymphocytes Relative: 26 % (ref 12–46)
Lymphs Abs: 2.8 10*3/uL (ref 0.7–4.0)
MCH: 30.5 pg (ref 26.0–34.0)
MCV: 90.1 fL (ref 78.0–100.0)
Monocytes Relative: 8 % (ref 3–12)
Neutrophils Relative %: 65 % (ref 43–77)
Platelets: 397 10*3/uL (ref 150–400)
RBC: 3.93 MIL/uL (ref 3.87–5.11)
Rh Type: POSITIVE
Rubella: 8.1 Index — ABNORMAL HIGH (ref <0.90)
WBC: 10.7 10*3/uL — ABNORMAL HIGH (ref 4.0–10.5)

## 2013-05-04 LAB — CULTURE, OB URINE: Colony Count: 30000

## 2013-05-04 LAB — WET PREP BY MOLECULAR PROBE
Gardnerella vaginalis: POSITIVE — AB
Trichomonas vaginosis: NEGATIVE

## 2013-05-04 LAB — VITAMIN D 25 HYDROXY (VIT D DEFICIENCY, FRACTURES): Vit D, 25-Hydroxy: 27 ng/mL — ABNORMAL LOW (ref 30–89)

## 2013-05-04 LAB — VARICELLA ZOSTER ANTIBODY, IGG: Varicella IgG: 2631 Index — ABNORMAL HIGH (ref <135.00)

## 2013-05-04 LAB — HEMOGLOBIN A1C: Hgb A1c MFr Bld: 5.1 % (ref <5.7)

## 2013-05-04 NOTE — Patient Instructions (Signed)
Prenatal Care  WHAT IS PRENATAL CARE?  Prenatal care means health care during your pregnancy, before your baby is born. Take care of yourself and your baby by:   Getting early prenatal care. If you know you are pregnant, or think you might be pregnant, call your caregiver as soon as possible. Schedule a visit for a general/prenatal examination.  Getting regular prenatal care. Follow your caregiver's schedule for blood and other necessary tests. Do not miss appointments.  Do everything you can to keep yourself and your baby healthy during your pregnancy.  Prenatal care should include evaluation of medical, dietary, educational, psychological, and social needs for the couple and the medical, surgical, and genetic history of the family of the mother and father.  Discuss with your caregiver:  Your medicines, prescription, over-the-counter, and herbal medicines.  Substance abuse, alcohol, smoking, and illegal drugs.  Domestic abuse and violence, if present.  Your immunizations.  Nutrition and diet.  Exercising.  Environment and occupational hazards, at home and at work.  History of sexually transmitted disease, both you and your partner.  Previous pregnancies. WHY IS PRENATAL CARE SO IMPORTANT?  By seeing you regularly, your caregiver has the chance to find problems early, so that they can be treated as soon as possible. Other problems might be prevented. Many studies have shown that early and regular prenatal care is important for the health of both mothers and their babies.  I AM THINKING ABOUT GETTING PREGNANT. HOW CAN I TAKE CARE OF MYSELF?  Taking care of yourself before you get pregnant helps you to have a healthy pregnancy. It also lowers your chances of having a baby born with a birth defect. Here are ways to take care of yourself before you get pregnant:   Eat healthy foods, exercise regularly (30 minutes per day for most days of the week is best), and get enough rest and  sleep. Talk to your caregiver about what kinds of foods and exercises are best for you.  Take 400 micrograms (mcg) of folic acid (one of the B vitamins) every day. The best way to do this is to take a daily multivitamin pill that contains this amount of folic acid. Getting enough of the synthetic (manufactured) form of folic acid every day before you get pregnant and during early pregnancy can help prevent certain birth defects. Many breakfast cereals and other grain products have folic acid added to them, but only certain cereals contain 400 mcg of folic acid per serving. Check the label on your multivitamin or cereal to find the amount of folic acid in the food.  See your caregiver for a complete check up before getting pregnant. Make sure that you have had all your immunization shots, especially for rubella (German measles). Rubella can cause serious birth defects. Chickenpox is another illness you want to avoid during pregnancy. If you have had chickenpox and rubella in the past, you should be immune to them.  Tell your caregiver about any prescription or non-prescription medicines (including herbal remedies) you are taking. Some medicines are not safe to take during pregnancy.  Stop smoking cigarettes, drinking alcohol, or taking illegal drugs. Ask your caregiver for help, if you need it. You can also get help with alcohol and drugs by talking with a member of your faith community, a counselor, or a trusted friend.  Discuss and treat any medical, social, or psychological problems before getting pregnant.  Discuss any history of genetic problems in the mother, father, and their families. Do   genetic testing before getting pregnant, when possible.  Discuss any physical or emotional abuse with your caregiver.  Discuss with your caregiver if you might be exposed to harmful chemicals on your job or where you live.  Discuss with your caregiver if you think your job or the hours you work may be  harmful and should be changed.  The father should be involved with the decision making and with all aspects of the pregnancy, labor, and delivery.  If you have medical insurance, make sure you are covered for pregnancy. I JUST FOUND OUT THAT I AM PREGNANT. HOW CAN I TAKE CARE OF MYSELF?  Here are ways to take care of yourself and the precious new life growing inside you:   Continue taking your multivitamin with 400 micrograms (mcg) of folic acid every day.  Get early and regular prenatal care. It does not matter if this is your first pregnancy or if you already have children. It is very important to see a caregiver during your pregnancy. Your caregiver will check at each visit to make sure that you and the baby are healthy. If there are any problems, action can be taken right away to help you and the baby.  Eat a healthy diet that includes:  Fruits.  Vegetables.  Foods low in saturated fat.  Grains.  Calcium-rich foods.  Drink 6 to 8 glasses of liquids a day.  Unless your caregiver tells you not to, try to be physically active for 30 minutes, most days of the week. If you are pressed for time, you can get your activity in through 10 minute segments, three times a day.  If you smoke, drink alcohol, or use drugs, STOP. These can cause long-term damage to your baby. Talk with your caregiver about steps to take to stop smoking. Talk with a member of your faith community, a counselor, a trusted friend, or your caregiver if you are concerned about your alcohol or drug use.  Ask your caregiver before taking any medicine, even over-the-counter medicines. Some medicines are not safe to take during pregnancy.  Get plenty of rest and sleep.  Avoid hot tubs and saunas during pregnancy.  Do not have X-rays taken, unless absolutely necessary and with the recommendation of your caregiver. A lead shield can be placed on your abdomen, to protect the baby when X-rays are taken in other parts of the  body.  Do not empty the cat litter when you are pregnant. It may contain a parasite that causes an infection called toxoplasmosis, which can cause birth defects. Also, use gloves when working in garden areas used by cats.  Do not eat uncooked or undercooked cheese, meats, or fish.  Stay away from toxic chemicals like:  Insecticides.  Solvents (some cleaners or paint thinners).  Lead.  Mercury.  Sexual relations may continue until the end of the pregnancy, unless you have a medical problem or there is a problem with the pregnancy and your caregiver tells you not to.  Do not wear high heel shoes, especially during the second half of the pregnancy. You can lose your balance and fall.  Do not take long trips, unless absolutely necessary. Be sure to see your caregiver before going on the trip.  Do not sit in one position for more than 2 hours, when on a trip.  Take a copy of your medical records when going on a trip.  Know where there is a hospital in the city you are visiting, in case of an   emergency.  Most dangerous household products will have pregnancy warnings on their labels. Ask your caregiver about products if you are unsure.  Limit or eliminate your caffeine intake from coffee, tea, sodas, medicines, and chocolate.  Many women continue working through pregnancy. Staying active might help you stay healthier. If you have a question about the safety or the hours you work at your particular job, talk with your caregiver.  Get informed:  Read books.  Watch videos.  Go to childbirth classes for you and the father.  Talk with experienced moms.  Ask your caregiver about childbirth education classes for you and your partner. Classes can help you and your partner prepare for the birth of your baby.  Ask about a pediatrician (baby doctor) and methods and pain medicine for labor, delivery, and possible Cesarean delivery (C-section). I AM NOT THINKING ABOUT GETTING PREGNANT  RIGHT NOW, BUT HEARD THAT ALL WOMEN SHOULD TAKE FOLIC ACID EVERY DAY?  All women of childbearing age, with even a remote chance of getting pregnant, should try to make sure they get enough folic acid. Many pregnancies are not planned. Many women do not know they are actually pregnant early in their pregnancies, and certain birth defects happen in the very early part of pregnancy. Taking 400 micrograms (mcg) of folic acid every day will help prevent certain birth defects that happen in the early part of pregnancy. If a woman begins taking vitamin pills in the second or third month of pregnancy, it may be too late to prevent birth defects. Folic acid may also have other health benefits for women, besides preventing birth defects.  HOW OFTEN SHOULD I SEE MY CAREGIVER DURING PREGNANCY?  Your caregiver will give you a schedule for your prenatal visits. You will have visits more often as you get closer to the end of your pregnancy. An average pregnancy lasts about 40 weeks.  A typical schedule includes visiting your caregiver:   About once each month, during your first 6 months of pregnancy.  Every 2 weeks, during the next 2 months.  Weekly in the last month, until the delivery date. Your caregiver will probably want to see you more often if:  You are over 35.  Your pregnancy is high risk, because you have certain health problems or problems with the pregnancy, such as:  Diabetes.  High blood pressure.  The baby is not growing on schedule, according to the dates of the pregnancy. Your caregiver will do special tests, to make sure you and the baby are not having any serious problems. WHAT HAPPENS DURING PRENATAL VISITS?   At your first prenatal visit, your caregiver will talk to you about you and your partner's health history and your family's health history, and will do a physical exam.  On your first visit, a physical exam will include checks of your blood pressure, height and weight, and an  exam of your pelvic organs. Your caregiver will do a Pap test if you have not had one recently, and will do cultures of your cervix to make sure there is no infection.  At each visit, there will be tests of your blood, urine, blood pressure, weight, and checking the progress of the baby.  Your caregiver will be able to tell you when to expect that your baby will be born.  Each visit is also a chance for you to learn about staying healthy during pregnancy and for asking questions.  Discuss whether you will be breastfeeding.  At your later prenatal   visits, your caregiver will check how you are doing and how the baby is developing. You may have a number of tests done as your pregnancy progresses.  Ultrasound exams are often used to check on the baby's growth and health.  You may have more urine and blood tests, as well as special tests, if needed. These may include amniocentesis (examine fluid in the pregnancy sac), stress tests (check how baby responds to contractions), biophysical profile (measures fetus well-being). Your caregiver will explain the tests and why they are necessary. I AM IN MY LATE THIRTIES, AND I WANT TO HAVE A CHILD NOW. SHOULD I DO ANYTHING SPECIAL?  As you get older, there is more chance of having a medical problem (high blood pressure), pregnancy problem (preeclampsia, problems with the placenta), miscarriage, or a baby born with a birth defect. However, most women in their late thirties and early forties have healthy babies. See your caregiver on a regular basis before you get pregnant and be sure to go for exams throughout your pregnancy. Your caregiver probably will want to do some special tests to check on you and your baby's health when you are pregnant.  Women today are often delaying having children until later in life, when they are in their thirties and forties. While many women in their thirties and forties have no difficulty getting pregnant, fertility does decline  with age. For women over 40 who cannot get pregnant after 6 months of trying, it is recommended that they see their caregiver for a fertility evaluation. It is not uncommon to have trouble becoming pregnant or experience infertility (inability to become pregnant after trying for one year). If you think that you or your partner may be infertile, you can discuss this with your caregiver. He or she can recommend treatments such as drugs, surgery, or assisted reproductive technology.  Document Released: 06/17/2003 Document Revised: 09/06/2011 Document Reviewed: 11/30/2012 [employer] Patient Information 2014 [employer].  

## 2013-05-07 ENCOUNTER — Other Ambulatory Visit: Payer: Self-pay | Admitting: *Deleted

## 2013-05-07 DIAGNOSIS — Z3401 Encounter for supervision of normal first pregnancy, first trimester: Secondary | ICD-10-CM

## 2013-05-07 LAB — HEMOGLOBINOPATHY EVALUATION
Hemoglobin Other: 0 %
Hgb A: 97 % (ref 96.8–97.8)
Hgb F Quant: 0 % (ref 0.0–2.0)
Hgb S Quant: 0 %

## 2013-05-09 ENCOUNTER — Encounter: Payer: Self-pay | Admitting: Obstetrics & Gynecology

## 2013-05-09 ENCOUNTER — Ambulatory Visit (INDEPENDENT_AMBULATORY_CARE_PROVIDER_SITE_OTHER): Payer: BC Managed Care – PPO

## 2013-05-09 ENCOUNTER — Other Ambulatory Visit: Payer: Self-pay | Admitting: Obstetrics & Gynecology

## 2013-05-09 ENCOUNTER — Other Ambulatory Visit: Payer: Self-pay | Admitting: *Deleted

## 2013-05-09 DIAGNOSIS — O3680X0 Pregnancy with inconclusive fetal viability, not applicable or unspecified: Secondary | ICD-10-CM

## 2013-05-09 DIAGNOSIS — O34531 Maternal care for retroversion of gravid uterus, first trimester: Secondary | ICD-10-CM

## 2013-05-09 DIAGNOSIS — O3680X1 Pregnancy with inconclusive fetal viability, fetus 1: Secondary | ICD-10-CM

## 2013-05-09 DIAGNOSIS — O34519 Maternal care for incarceration of gravid uterus, unspecified trimester: Secondary | ICD-10-CM

## 2013-05-09 DIAGNOSIS — Z3401 Encounter for supervision of normal first pregnancy, first trimester: Secondary | ICD-10-CM

## 2013-05-09 DIAGNOSIS — B9689 Other specified bacterial agents as the cause of diseases classified elsewhere: Secondary | ICD-10-CM

## 2013-05-09 LAB — US OB DETAIL + 14 WK

## 2013-05-09 MED ORDER — METRONIDAZOLE 500 MG PO TABS: 500.0000 mg | ORAL_TABLET | Freq: Two times a day (BID) | ORAL | Status: DC

## 2013-05-14 ENCOUNTER — Encounter: Payer: Self-pay | Admitting: Obstetrics & Gynecology

## 2013-05-22 ENCOUNTER — Other Ambulatory Visit: Payer: Self-pay | Admitting: *Deleted

## 2013-05-22 DIAGNOSIS — E559 Vitamin D deficiency, unspecified: Secondary | ICD-10-CM

## 2013-05-22 MED ORDER — OB COMPLETE PETITE 35-5-1-200 MG PO CAPS: 1.0000 | ORAL_CAPSULE | Freq: Every day | ORAL | Status: DC

## 2013-05-28 ENCOUNTER — Encounter: Payer: Self-pay | Admitting: Obstetrics & Gynecology

## 2013-05-28 LAB — US OB COMP LESS 14 WKS

## 2013-05-31 ENCOUNTER — Ambulatory Visit (INDEPENDENT_AMBULATORY_CARE_PROVIDER_SITE_OTHER): Payer: BC Managed Care – PPO | Admitting: Obstetrics & Gynecology

## 2013-05-31 ENCOUNTER — Encounter: Payer: Self-pay | Admitting: *Deleted

## 2013-05-31 VITALS — BP 134/87 | Temp 98.9°F | Wt 162.0 lb

## 2013-05-31 DIAGNOSIS — Z3401 Encounter for supervision of normal first pregnancy, first trimester: Secondary | ICD-10-CM

## 2013-05-31 DIAGNOSIS — Z3481 Encounter for supervision of other normal pregnancy, first trimester: Secondary | ICD-10-CM

## 2013-05-31 DIAGNOSIS — N390 Urinary tract infection, site not specified: Secondary | ICD-10-CM

## 2013-05-31 DIAGNOSIS — Z3480 Encounter for supervision of other normal pregnancy, unspecified trimester: Secondary | ICD-10-CM

## 2013-05-31 DIAGNOSIS — Z3400 Encounter for supervision of normal first pregnancy, unspecified trimester: Secondary | ICD-10-CM

## 2013-05-31 LAB — POCT URINALYSIS DIPSTICK
Bilirubin, UA: NEGATIVE
Blood, UA: NEGATIVE
Glucose, UA: NEGATIVE
Ketones, UA: NEGATIVE
Nitrite, UA: NEGATIVE
Protein, UA: NEGATIVE
pH, UA: 5

## 2013-05-31 MED ORDER — SULFAMETHOXAZOLE-TMP DS 800-160 MG PO TABS: 1.0000 | ORAL_TABLET | Freq: Two times a day (BID) | ORAL | Status: DC

## 2013-05-31 NOTE — Progress Notes (Deleted)
HR - 102 Pt in office for routine OB visit, reports continues to have vaginal irritation and odor, complains of lower abd cramping when on feet for extended periods of time, would like a note to take breaks at work, having problems with nausea, states she has trouble wanting to eat.   Subjective:    Maleeyah Kathie Rhodes Verley is a 22 y.o. female being seen today for her obstetrical visit. She is at [redacted]w[redacted]d gestation. Patient reports {symptoms:14538}. Fetal movement: {fetal movement:14572}.  Menstrual History: OB History   Grav Para Term Preterm Abortions TAB SAB Ect Mult Living   3 0 0  2 0 1   0      Menarche age: *** Patient's last menstrual period was 03/06/2013.    {Common ambulatory SmartLinks:19316}  Review of Systems {ros; complete:30496}   Objective:     BP 134/87  Temp(Src) 98.9 F (37.2 C)  Wt 162 lb (73.483 kg)  LMP 03/06/2013 Uterine Size: {fundal height:14540}  Pelvic Exam:          Perineum: {perineum exam:10172}                Vulva: {vulva exam:14487}              Vagina:  {vaginal exam:14498}                     pH: ***              Cervix: {cervix exam:14595}          Wet Prep: {wet mount:5123}              Uterus: {uterus exam:14489}             Adnexa: {adnexa:12223}     Bony Pelvis: {desc; pelvis:14491}     Assessment:    Pregnancy {numbers; 0-42:17906} and {numbers; 0-7:15237}/7 weeks   Plan:    Problem list reviewed and updated. Labs reviewed. AFP3 discussed: {requested/ordered/declined:14581}. Role of ultrasound in pregnancy discussed; fetal survey: {requested/ordered/declined:14581}. Amniocentesis discussed: {amniocentesis:14582}. Follow up in {0-4:31231} weeks. ***% of *** minute visit spent on counseling and coordination of care. ***.

## 2013-06-01 LAB — CULTURE, OB URINE: Organism ID, Bacteria: NO GROWTH

## 2013-06-02 NOTE — Progress Notes (Signed)
Doing well 

## 2013-06-08 ENCOUNTER — Encounter (HOSPITAL_COMMUNITY): Payer: Self-pay

## 2013-06-08 ENCOUNTER — Inpatient Hospital Stay (HOSPITAL_COMMUNITY)
Admission: AD | Admit: 2013-06-08 | Discharge: 2013-06-08 | Disposition: A | Discharge status: Home / Self Care | Payer: BC Managed Care – PPO | Source: Ambulatory Visit | Attending: Obstetrics & Gynecology | Admitting: Obstetrics & Gynecology

## 2013-06-08 DIAGNOSIS — W19XXXA Unspecified fall, initial encounter: Secondary | ICD-10-CM

## 2013-06-08 DIAGNOSIS — Z87891 Personal history of nicotine dependence: Secondary | ICD-10-CM | POA: Insufficient documentation

## 2013-06-08 DIAGNOSIS — M791 Myalgia, unspecified site: Secondary | ICD-10-CM

## 2013-06-08 DIAGNOSIS — M25559 Pain in unspecified hip: Secondary | ICD-10-CM

## 2013-06-08 DIAGNOSIS — O99891 Other specified diseases and conditions complicating pregnancy: Secondary | ICD-10-CM

## 2013-06-08 DIAGNOSIS — Y93E1 Activity, personal bathing and showering: Secondary | ICD-10-CM | POA: Insufficient documentation

## 2013-06-08 DIAGNOSIS — Y92009 Unspecified place in unspecified non-institutional (private) residence as the place of occurrence of the external cause: Secondary | ICD-10-CM

## 2013-06-08 DIAGNOSIS — R109 Unspecified abdominal pain: Secondary | ICD-10-CM | POA: Insufficient documentation

## 2013-06-08 DIAGNOSIS — W010XXA Fall on same level from slipping, tripping and stumbling without subsequent striking against object, initial encounter: Secondary | ICD-10-CM | POA: Insufficient documentation

## 2013-06-08 LAB — URINALYSIS, ROUTINE W REFLEX MICROSCOPIC
Glucose, UA: NEGATIVE mg/dL
Hgb urine dipstick: NEGATIVE
Ketones, ur: NEGATIVE mg/dL
Leukocytes, UA: NEGATIVE
Specific Gravity, Urine: >1.03 — ABNORMAL HIGH (ref 1.005–1.030)
pH: 5 (ref 5.0–8.0)

## 2013-06-08 MED ORDER — ACETAMINOPHEN 500 MG PO TABS
1000.0000 mg | ORAL_TABLET | Freq: Once | ORAL | Status: AC
  Administered 2013-06-08: 1000 mg via ORAL
  Filled 2013-06-08: qty 2

## 2013-06-08 NOTE — MAU Note (Signed)
Pt reports she fell "yesterday, while getting out of bathtub" .  Reports she fell on her right side but left side at mid back, shoots down leg is painful.  Denies bleeding.

## 2013-06-08 NOTE — MAU Provider Note (Signed)
History     CSN: [id number]  Arrival date and time: 06/08/13 1946   First Provider Initiated Contact with Patient 06/08/13 2035      Chief Complaint  Patient presents with  . Fall   HPI [name] is a 22 y.o. female G3P0020 at [redacted]w[redacted]d who presents with concerns following a fall she experienced yesterday. She fell yesterday while trying to get out of the tub, "she nicked her right side" during the fall. She feels like the right hip and right elbow took the majority of the fall. She did not fall onto her abdomen directly at all. She denies vaginal bleeding or gush of blood. She complains of pain on the opposite side of her body including her left hip with pain that shoots down to her left leg. The pain shoots down to her calf in her left leg and is worse when she changes positions. She has never had this pain before, however she does occasionally have back pain at night.  She has not tried anything for pain today. She currently rates her pain in her left hip/left 8/10  OB History   Grav Para Term Preterm Abortions TAB SAB Ect Mult Living   3 0 0  2 0 1   0      Past Medical History  Diagnosis Date  . Trichomonas   . Medical history non-contributory     Past Surgical History  Procedure Laterality Date  . Dilation and curettage of uterus      Family History  Problem Relation Age of Onset  . Cancer Brother   . Hypertension Maternal Grandfather   . Diabetes Brother     History  Substance Use Topics  . Smoking status: Former Smoker    Quit date: 04/02/2013  . Smokeless tobacco: Not on file  . Alcohol Use: No    Allergies:  Allergies  Allergen Reactions  . Latex Rash    In vaginal area    Prescriptions prior to admission  Medication Sig Dispense Refill  . Prenatal Vit-Fe Fumarate-FA (PRENATAL MULTIVITAMIN) TABS tablet Take 1 tablet by mouth daily at 12 noon.      . sulfamethoxazole-trimethoprim (BACTRIM DS) 800-160 MG per tablet Take 1 tablet by mouth 2  (two) times daily.  14 tablet  7   Results for orders placed during the hospital encounter of 06/08/13 (from the past 24 hour(s))  URINALYSIS, ROUTINE W REFLEX MICROSCOPIC     Status: Abnormal   Collection Time    06/08/13  7:50 PM      Result Value Range   Color, Urine YELLOW  YELLOW   APPearance CLEAR  CLEAR   Specific Gravity, Urine >1.030 (*) 1.005 - 1.030   pH 5.0  5.0 - 8.0   Glucose, UA NEGATIVE  NEGATIVE mg/dL   Hgb urine dipstick NEGATIVE  NEGATIVE   Bilirubin Urine NEGATIVE  NEGATIVE   Ketones, ur NEGATIVE  NEGATIVE mg/dL   Protein, ur NEGATIVE  NEGATIVE mg/dL   Urobilinogen, UA 0.2  0.0 - 1.0 mg/dL   Nitrite NEGATIVE  NEGATIVE   Leukocytes, UA NEGATIVE  NEGATIVE     Review of Systems  Constitutional: Negative for fever and chills.  Gastrointestinal: Negative for nausea, vomiting, abdominal pain, diarrhea and constipation.  Genitourinary: Negative for dysuria, urgency, frequency and hematuria.       No vaginal discharge. No vaginal bleeding. No dysuria.   Musculoskeletal: Positive for back pain.       + Left hip  pain that radiates down her left leg.  Neurological: Negative for dizziness.   Physical Exam   Blood pressure 111/61, pulse 99, temperature 98.3 F (36.8 C), temperature source Oral, resp. rate 20, height 5\' 3"  (1.6 m), weight 73.936 kg (163 lb), last menstrual period 03/06/2013, SpO2 100.00%. Fetal heart rate by doppler 150 bpm   Physical Exam  Constitutional: She is oriented to person, place, and time. She appears well-developed and well-nourished. No distress.  HENT:  Head: Normocephalic.  Eyes: Pupils are equal, round, and reactive to light.  Neck: Neck supple.  Respiratory: Effort normal.  GI: Soft. She exhibits no distension. There is no tenderness.  Musculoskeletal: Normal range of motion. She exhibits no edema and no tenderness.       Right elbow: She exhibits normal range of motion, no swelling and no deformity. No tenderness found.        Right hip: She exhibits normal range of motion, normal strength, no tenderness and no swelling.       Left hip: She exhibits normal range of motion, normal strength, no tenderness, no bony tenderness and no swelling.  Neurological: She is alert and oriented to person, place, and time.  Skin: Skin is warm. She is not diaphoretic.  Psychiatric: Her behavior is normal.    MAU Course  Procedures None  MDM +fht Consulted with Dr. Tamela Oddi; ok to given patient extra strength tylenol and have her follow up in the office if pain does not improve  Tylenol 1 gram given in MAU; pt rates her pain 4/10 prior to discharge home.   Assessment and Plan   A:  1. Fall at home, initial encounter   2. Muscle pain   3.  Round Ligament pain   P:  Discharge home Return to MAU if symptoms worsen Return to MAU with any vaginal bleeding  Call the office if needing to be seen prior to your regular scheduled appointment Ok to take tylenol for pain; as prescribed on the bottle  Pregnancy support belt recommended   Iona Hansen Rasch, NP  06/08/2013, 8:35 PM

## 2013-06-18 ENCOUNTER — Encounter: Payer: Self-pay | Admitting: Obstetrics & Gynecology

## 2013-06-27 ENCOUNTER — Ambulatory Visit (INDEPENDENT_AMBULATORY_CARE_PROVIDER_SITE_OTHER): Payer: BC Managed Care – PPO | Admitting: Obstetrics & Gynecology

## 2013-06-27 VITALS — BP 123/80 | Temp 98.5°F | Wt 168.0 lb

## 2013-06-27 DIAGNOSIS — Z3482 Encounter for supervision of other normal pregnancy, second trimester: Secondary | ICD-10-CM

## 2013-06-27 DIAGNOSIS — Z3480 Encounter for supervision of other normal pregnancy, unspecified trimester: Secondary | ICD-10-CM

## 2013-06-27 LAB — POCT URINALYSIS DIPSTICK
Glucose, UA: NEGATIVE
Ketones, UA: NEGATIVE
Nitrite, UA: NEGATIVE
Spec Grav, UA: <=1.005
Urobilinogen, UA: NEGATIVE
pH, UA: 8

## 2013-06-27 NOTE — Progress Notes (Signed)
Pulse 92 Pt states that she was to get medication for nausea but has not yet been sent to pharmacy.  Pt states that her nausea is usually worse after taking her prenatal vitamin.  Pt has had an appt with ortho for her leg pain. Injection was given with some relief. Pt has f/u at [facility] on 07/09/13.  Pt would like to know when her u/s will be. Pt states that she was told that she would have one at todays visit.

## 2013-06-28 NOTE — L&D Delivery Note (Signed)
Delivery Note At 8:27 PM a viable female was delivered via Vaginal, Spontaneous Delivery (Presentation: Left Occiput Anterior).  APGAR: 9, 9; weight .   Placenta status: , .  Cord: 3 vessels with the following complications: None.  Cord pH: not done  Anesthesia: Local Epidural  Episiotomy: None Lacerations: 1st degree Suture Repair: 2.0 vicryl Est. Blood Loss (mL):   Mom to postpartum.  Baby to Couplet care / Skin to Skin.  Tiena Manansala A 12/06/2013, 8:48 PM

## 2013-06-29 NOTE — Patient Instructions (Signed)
Second Trimester of Pregnancy The second trimester is from week 13 through week 28, months 4 through 6. The second trimester is often a time when you feel your best. Your body has also adjusted to being pregnant, and you begin to feel better physically. Usually, morning sickness has lessened or quit completely, you may have more energy, and you may have an increase in appetite. The second trimester is also a time when the fetus is growing rapidly. At the end of the sixth month, the fetus is about 9 inches long and weighs about 1 pounds. You will likely begin to feel the baby move (quickening) between 18 and 20 weeks of the pregnancy. BODY CHANGES Your body goes through many changes during pregnancy. The changes vary from woman to woman.   Your weight will continue to increase. You will notice your lower abdomen bulging out.  You may begin to get stretch marks on your hips, abdomen, and breasts.  You may develop headaches that can be relieved by medicines approved by your caregiver.  You may urinate more often because the fetus is pressing on your bladder.  You may develop or continue to have heartburn as a result of your pregnancy.  You may develop constipation because certain hormones are causing the muscles that push waste through your intestines to slow down.  You may develop hemorrhoids or swollen, bulging veins (varicose veins).  You may have back pain because of the weight gain and pregnancy hormones relaxing your joints between the bones in your pelvis and as a result of a shift in weight and the muscles that support your balance.  Your breasts will continue to grow and be tender.  Your gums may bleed and may be sensitive to brushing and flossing.  Dark spots or blotches (chloasma, mask of pregnancy) may develop on your face. This will likely fade after the baby is born.  A dark line from your belly button to the pubic area (linea nigra) may appear. This will likely fade after the  baby is born. WHAT TO EXPECT AT YOUR PRENATAL VISITS During a routine prenatal visit:  You will be weighed to make sure you and the fetus are growing normally.  Your blood pressure will be taken.  Your abdomen will be measured to track your baby's growth.  The fetal heartbeat will be listened to.  Any test results from the previous visit will be discussed. Your caregiver may ask you:  How you are feeling.  If you are feeling the baby move.  If you have had any abnormal symptoms, such as leaking fluid, bleeding, severe headaches, or abdominal cramping.  If you have any questions. Other tests that may be performed during your second trimester include:  Blood tests that check for:  Low iron levels (anemia).  Gestational diabetes (between 24 and 28 weeks).  Rh antibodies.  Urine tests to check for infections, diabetes, or protein in the urine.  An ultrasound to confirm the proper growth and development of the baby.  An amniocentesis to check for possible genetic problems.  Fetal screens for spina bifida and Down syndrome. HOME CARE INSTRUCTIONS   Avoid all smoking, herbs, alcohol, and unprescribed drugs. These chemicals affect the formation and growth of the baby.  Follow your caregiver's instructions regarding medicine use. There are medicines that are either safe or unsafe to take during pregnancy.  Exercise only as directed by your caregiver. Experiencing uterine cramps is a good sign to stop exercising.  Continue to eat regular,   healthy meals.  Wear a good support bra for breast tenderness.  Do not use hot tubs, steam rooms, or saunas.  Wear your seat belt at all times when driving.  Avoid raw meat, uncooked cheese, cat litter boxes, and soil used by cats. These carry germs that can cause birth defects in the baby.  Take your prenatal vitamins.  Try taking a stool softener (if your caregiver approves) if you develop constipation. Eat more high-fiber foods,  such as fresh vegetables or fruit and whole grains. Drink plenty of fluids to keep your urine clear or pale yellow.  Take warm sitz baths to soothe any pain or discomfort caused by hemorrhoids. Use hemorrhoid cream if your caregiver approves.  If you develop varicose veins, wear support hose. Elevate your feet for 15 minutes, 3 4 times a day. Limit salt in your diet.  Avoid heavy lifting, wear low heel shoes, and practice good posture.  Rest with your legs elevated if you have leg cramps or low back pain.  Visit your dentist if you have not gone yet during your pregnancy. Use a soft toothbrush to brush your teeth and be gentle when you floss.  A sexual relationship may be continued unless your caregiver directs you otherwise.  Continue to go to all your prenatal visits as directed by your caregiver. SEEK MEDICAL CARE IF:   You have dizziness.  You have mild pelvic cramps, pelvic pressure, or nagging pain in the abdominal area.  You have persistent nausea, vomiting, or diarrhea.  You have a bad smelling vaginal discharge.  You have pain with urination. SEEK IMMEDIATE MEDICAL CARE IF:   You have a fever.  You are leaking fluid from your vagina.  You have spotting or bleeding from your vagina.  You have severe abdominal cramping or pain.  You have rapid weight gain or loss.  You have shortness of breath with chest pain.  You notice sudden or extreme swelling of your face, hands, ankles, feet, or legs.  You have not felt your baby move in over an hour.  You have severe headaches that do not go away with medicine.  You have vision changes. Document Released: 06/08/2001 Document Revised: 02/14/2013 Document Reviewed: 08/15/2012 Exit[employer] Patient Information 2014 [employer].  

## 2013-07-10 ENCOUNTER — Other Ambulatory Visit: Payer: Self-pay | Admitting: Obstetrics & Gynecology

## 2013-07-10 DIAGNOSIS — Z1389 Encounter for screening for other disorder: Secondary | ICD-10-CM

## 2013-07-11 ENCOUNTER — Ambulatory Visit (INDEPENDENT_AMBULATORY_CARE_PROVIDER_SITE_OTHER): Payer: BC Managed Care – PPO

## 2013-07-11 DIAGNOSIS — Z1389 Encounter for screening for other disorder: Secondary | ICD-10-CM

## 2013-07-12 ENCOUNTER — Encounter: Payer: Self-pay | Admitting: Obstetrics

## 2013-07-12 LAB — US OB DETAIL + 14 WK

## 2013-07-18 ENCOUNTER — Encounter: Payer: Self-pay | Admitting: Obstetrics & Gynecology

## 2013-07-25 ENCOUNTER — Ambulatory Visit (INDEPENDENT_AMBULATORY_CARE_PROVIDER_SITE_OTHER): Payer: BC Managed Care – PPO | Admitting: Obstetrics & Gynecology

## 2013-07-25 VITALS — BP 121/79 | Temp 98.7°F | Wt 173.0 lb

## 2013-07-25 DIAGNOSIS — Z3480 Encounter for supervision of other normal pregnancy, unspecified trimester: Secondary | ICD-10-CM

## 2013-07-25 LAB — POCT URINALYSIS DIPSTICK
Bilirubin, UA: NEGATIVE
Blood, UA: NEGATIVE
Glucose, UA: NEGATIVE
KETONES UA: NEGATIVE
LEUKOCYTES UA: NEGATIVE
Nitrite, UA: NEGATIVE
Spec Grav, UA: <=1.005
Urobilinogen, UA: NEGATIVE
pH, UA: >=9

## 2013-07-25 NOTE — Patient Instructions (Signed)
Glucose Tolerance Test This is a test to see how your body processes carbohydrates. This test is often done to check patients for diabetes or the possibility of developing it. PREPARATION FOR TEST You should have nothing to eat or drink 12 hours before the test. You will be given a form of sugar (glucose) and then blood samples will be drawn from your vein to determine the level of sugar in your blood. Alternatively, blood may be drawn from your finger for testing. You should not smoke or exercise during the test. NORMAL FINDINGS  Fasting: 70-115 mg/dL  30 minutes: less than 200 mg/dL  1 hour: less than 200 mg/dL  2 hours: less than 140 mg/dL  3 hours: 70-115 mg/dL  4 hours: 70-115 mg/dL Ranges for normal findings may vary among different laboratories and hospitals. You should always check with your doctor after having lab work or other tests done to discuss the meaning of your test results and whether your values are considered within normal limits. MEANING OF TEST Your caregiver will go over the test results with you and discuss the importance and meaning of your results, as well as treatment options and the need for additional tests. OBTAINING THE TEST RESULTS It is your responsibility to obtain your test results. Ask the lab or department performing the test when and how you will get your results. Document Released: 07/07/2004 Document Revised: 09/06/2011 Document Reviewed: 05/25/2008 [employer] Patient Information 2014 [employer].  

## 2013-07-25 NOTE — Progress Notes (Signed)
Pulse: 84 Patient states she is having lower abdominal pain. Patient states sometimes it hurts so bad she cannot move. Patient states she is allergic to Sulfa Antibiotics and that her Prenatal has sulfa in it. Patient would like to know what to do. Patient would like to know if it is normal that her stomach is still soft.

## 2013-08-16 ENCOUNTER — Other Ambulatory Visit: Payer: Self-pay | Admitting: *Deleted

## 2013-08-16 DIAGNOSIS — O365990 Maternal care for other known or suspected poor fetal growth, unspecified trimester, not applicable or unspecified: Secondary | ICD-10-CM

## 2013-08-21 ENCOUNTER — Other Ambulatory Visit: Payer: BC Managed Care – PPO

## 2013-08-22 ENCOUNTER — Encounter: Payer: Self-pay | Admitting: Obstetrics

## 2013-08-22 ENCOUNTER — Other Ambulatory Visit: Payer: BC Managed Care – PPO

## 2013-08-22 ENCOUNTER — Ambulatory Visit (INDEPENDENT_AMBULATORY_CARE_PROVIDER_SITE_OTHER): Payer: BC Managed Care – PPO | Admitting: Obstetrics

## 2013-08-22 ENCOUNTER — Encounter: Payer: BC Managed Care – PPO | Admitting: Obstetrics & Gynecology

## 2013-08-22 VITALS — BP 110/72 | Temp 98.7°F | Wt 185.0 lb

## 2013-08-22 DIAGNOSIS — Z3400 Encounter for supervision of normal first pregnancy, unspecified trimester: Secondary | ICD-10-CM

## 2013-08-22 LAB — POCT URINALYSIS DIPSTICK
BILIRUBIN UA: NEGATIVE
Glucose, UA: NEGATIVE
KETONES UA: NEGATIVE
Leukocytes, UA: NEGATIVE
NITRITE UA: NEGATIVE
PH UA: 7
Protein, UA: NEGATIVE
RBC UA: NEGATIVE
Spec Grav, UA: 1.01
Urobilinogen, UA: NEGATIVE

## 2013-08-22 NOTE — Progress Notes (Signed)
Pulse: 91 Patient states she is having lower abdominal cramping at night when she is sleeping. Patient denies any concerns.

## 2013-08-29 ENCOUNTER — Other Ambulatory Visit: Payer: Self-pay | Admitting: Obstetrics & Gynecology

## 2013-08-29 ENCOUNTER — Ambulatory Visit (INDEPENDENT_AMBULATORY_CARE_PROVIDER_SITE_OTHER): Payer: BC Managed Care – PPO

## 2013-08-29 ENCOUNTER — Other Ambulatory Visit: Payer: BC Managed Care – PPO

## 2013-08-29 ENCOUNTER — Encounter: Payer: Self-pay | Admitting: Obstetrics & Gynecology

## 2013-08-29 DIAGNOSIS — O3430 Maternal care for cervical incompetence, unspecified trimester: Secondary | ICD-10-CM

## 2013-08-29 DIAGNOSIS — O365990 Maternal care for other known or suspected poor fetal growth, unspecified trimester, not applicable or unspecified: Secondary | ICD-10-CM

## 2013-08-29 LAB — US OB DETAIL + 14 WK

## 2013-08-30 ENCOUNTER — Encounter: Payer: Self-pay | Admitting: Obstetrics

## 2013-08-30 LAB — US OB DETAIL + 14 WK

## 2013-09-03 ENCOUNTER — Other Ambulatory Visit: Payer: Medicaid Other

## 2013-09-03 ENCOUNTER — Ambulatory Visit (INDEPENDENT_AMBULATORY_CARE_PROVIDER_SITE_OTHER): Payer: BC Managed Care – PPO | Admitting: Obstetrics & Gynecology

## 2013-09-03 VITALS — BP 128/84 | Temp 98.3°F | Wt 189.0 lb

## 2013-09-03 DIAGNOSIS — Z3480 Encounter for supervision of other normal pregnancy, unspecified trimester: Secondary | ICD-10-CM

## 2013-09-03 DIAGNOSIS — Z3400 Encounter for supervision of normal first pregnancy, unspecified trimester: Secondary | ICD-10-CM

## 2013-09-03 LAB — POCT URINALYSIS DIPSTICK
BILIRUBIN UA: NEGATIVE
Blood, UA: NEGATIVE
GLUCOSE UA: NEGATIVE
Ketones, UA: NEGATIVE
Leukocytes, UA: NEGATIVE
Nitrite, UA: NEGATIVE
Protein, UA: NEGATIVE
SPEC GRAV UA: 1.015
Urobilinogen, UA: NEGATIVE
pH, UA: 7

## 2013-09-03 LAB — CBC
HCT: 32.2 % — ABNORMAL LOW (ref 36.0–46.0)
Hemoglobin: 10.9 g/dL — ABNORMAL LOW (ref 12.0–15.0)
MCH: 31.3 pg (ref 26.0–34.0)
MCHC: 33.9 g/dL (ref 30.0–36.0)
MCV: 92.5 fL (ref 78.0–100.0)
PLATELETS: 322 10*3/uL (ref 150–400)
RBC: 3.48 MIL/uL — AB (ref 3.87–5.11)
RDW: 13.7 % (ref 11.5–15.5)
WBC: 13.2 10*3/uL — AB (ref 4.0–10.5)

## 2013-09-03 NOTE — Progress Notes (Signed)
Pulse 108 Pt states that she may have a yeast infection and would like exam.  Pt states that vaginal area doesn't feel right.  Pt is also having some lower abdominal pain that will go from side to side.  Pt states this makes it hard for her to sleep sometimes.

## 2013-09-04 LAB — GLUCOSE TOLERANCE, 2 HOURS W/ 1HR
GLUCOSE, FASTING: 76 mg/dL (ref 70–99)
GLUCOSE: 130 mg/dL (ref 70–170)
Glucose, 2 hour: 115 mg/dL (ref 70–139)

## 2013-09-04 LAB — RPR

## 2013-09-04 LAB — HIV ANTIBODY (ROUTINE TESTING W REFLEX): HIV: NONREACTIVE

## 2013-09-06 ENCOUNTER — Other Ambulatory Visit: Payer: BC Managed Care – PPO

## 2013-09-17 ENCOUNTER — Encounter (HOSPITAL_COMMUNITY): Payer: Self-pay

## 2013-09-17 ENCOUNTER — Inpatient Hospital Stay (HOSPITAL_COMMUNITY)
Admission: AD | Admit: 2013-09-17 | Discharge: 2013-09-17 | Disposition: A | Discharge status: Home / Self Care | Payer: Medicaid Other | Source: Ambulatory Visit | Attending: Obstetrics & Gynecology | Admitting: Obstetrics & Gynecology

## 2013-09-17 DIAGNOSIS — N949 Unspecified condition associated with female genital organs and menstrual cycle: Secondary | ICD-10-CM

## 2013-09-17 DIAGNOSIS — Z3689 Encounter for other specified antenatal screening: Secondary | ICD-10-CM

## 2013-09-17 DIAGNOSIS — Z87891 Personal history of nicotine dependence: Secondary | ICD-10-CM | POA: Insufficient documentation

## 2013-09-17 DIAGNOSIS — O368190 Decreased fetal movements, unspecified trimester, not applicable or unspecified: Secondary | ICD-10-CM | POA: Insufficient documentation

## 2013-09-17 LAB — URINALYSIS, ROUTINE W REFLEX MICROSCOPIC
Bilirubin Urine: NEGATIVE
Glucose, UA: NEGATIVE mg/dL
HGB URINE DIPSTICK: NEGATIVE
Ketones, ur: NEGATIVE mg/dL
Leukocytes, UA: NEGATIVE
Nitrite: NEGATIVE
PROTEIN: NEGATIVE mg/dL
Specific Gravity, Urine: 1.025 (ref 1.005–1.030)
UROBILINOGEN UA: 0.2 mg/dL (ref 0.0–1.0)
pH: 6 (ref 5.0–8.0)

## 2013-09-17 NOTE — Discharge Instructions (Signed)
°  Fetal Movement Counts Patient Name: __________________________________________________ Patient Due Date: ____________________ Performing a fetal movement count is highly recommended in high-risk pregnancies, but it is good for every pregnant woman to do. Your caregiver may ask you to start counting fetal movements at 28 weeks of the pregnancy. Fetal movements often increase:  After eating a full meal.  After physical activity.  After eating or drinking something sweet or cold.  At rest. Pay attention to when you feel the baby is most active. This will help you notice a pattern of your baby's sleep and wake cycles and what factors contribute to an increase in fetal movement. It is important to perform a fetal movement count at the same time each day when your baby is normally most active.  HOW TO COUNT FETAL MOVEMENTS 1. Find a quiet and comfortable area to sit or lie down on your left side. Lying on your left side provides the best blood and oxygen circulation to your baby. 2. Write down the day and time on a sheet of paper or in a journal. 3. Start counting kicks, flutters, swishes, rolls, or jabs in a 2 hour period. You should feel at least 10 movements within 2 hours. 4. If you do not feel 10 movements in 2 hours, wait 2 3 hours and count again. Look for a change in the pattern or not enough counts in 2 hours. SEEK MEDICAL CARE IF:  You feel less than 10 counts in 2 hours, tried twice.  There is no movement in over an hour.  The pattern is changing or taking longer each day to reach 10 counts in 2 hours.  You feel the baby is not moving as he or she usually does. Date: ____________ Movements: ____________ Start time: ____________ Doreatha MartinFinish time: ____________  Date: ____________ Movements: ____________ Start time: ____________ Doreatha MartinFinish time: ____________ Date: ____________ Movements: ____________ Start time: ____________ Doreatha MartinFinish time: ____________ Date: ____________ Movements: ____________  Start time: ____________ Doreatha MartinFinish time: ____________ Date: ____________ Movements: ____________ Start time: ____________ Doreatha MartinFinish time: ____________ Date: ____________ Movements: ____________ Start time: ____________ Doreatha MartinFinish time: ____________ Date: ____________ Movements: ____________ Start time: ____________ Doreatha MartinFinish time: ____________ Date: ____________ Movements: ____________ Start time: ____________ Doreatha MartinFinish time: ____________  Date: ____________ Movements: ____________ Start time: ____________ Doreatha MartinFinish time: ____________ Date: ____________ Movements: ____________ Start time: ____________ Doreatha MartinFinish time: ____________ Date: ____________ Movements: ____________ Start time: ____________ Doreatha MartinFinish time: ____________ Date: ____________ Movements: ____________ Start time: ____________ Doreatha MartinFinish time: ____________ Date: ____________ Movements: ____________ Start time: ____________ Doreatha MartinFinish time: ____________  Date: ____________ Movements: ____________ Start time: ____________ Doreatha MartinFinish time: ____________ Document Released: 07/14/2006 Document Revised: 05/31/2012 Document Reviewed: 04/10/2012 Exit[employer] Patient Information 2014 [place] [place]

## 2013-09-17 NOTE — MAU Note (Signed)
Pt presents complaining of no fetal movement since yesterday at 1800. Denies vaginal bleeding or discharge. Complaining of cramping on left side of abdomen. Denies leaking of fluid.

## 2013-09-17 NOTE — MAU Provider Note (Signed)
Chief Complaint:  Decreased Fetal Movement   First Provider Initiated Contact with Patient 09/17/13 1348      HPI: [name] is a 23 y.o. G3P0020 at 1031w6d pt of Dr Tamela OddiJackson-Moore who presents to maternity admissions reporting she did not feel the baby move for 2 hours while she was at work today.  She reports she usually feels kicks during that time.  She also reports a sharp, intermittent pain on the left side of her abdomen that occurs with walking and position change.  She does report fetal movement since arrival in MAU. She denies regular cramping/contractions, vaginal bleeding, vaginal itching/burning, urinary symptoms, h/a, dizziness, n/v, or fever/chills.     Past Medical History: Past Medical History  Diagnosis Date  . Trichomonas   . Medical history non-contributory     Past obstetric history: OB History  Gravida Para Term Preterm AB SAB TAB Ectopic Multiple Living  3 0 0  2 1 0   0    # Outcome Date GA Lbr Len/2nd Weight Sex Delivery Anes PTL Lv  3 CUR           2 ABT 06/28/09 519w0d         1 SAB               Past Surgical History: Past Surgical History  Procedure Laterality Date  . Dilation and curettage of uterus      Family History: Family History  Problem Relation Age of Onset  . Cancer Brother   . Hypertension Maternal Grandfather   . Diabetes Brother     Social History: History  Substance Use Topics  . Smoking status: Former Smoker    Quit date: 04/02/2013  . Smokeless tobacco: Not on file  . Alcohol Use: No    Allergies:  Allergies  Allergen Reactions  . Latex Rash    In vaginal area  . Sulfa Antibiotics Rash    Meds:  Prescriptions prior to admission  Medication Sig Dispense Refill  . Prenatal Vit-Fe Fumarate-FA (PRENATAL MULTIVITAMIN) TABS tablet Take 1 tablet by mouth daily at 12 noon.        ROS: Pertinent findings in history of present illness.  Physical Exam  Blood pressure 131/84, pulse 109, temperature 98 F (36.7 C),  temperature source Oral, resp. rate 18, last menstrual period 03/06/2013. GENERAL: Well-developed, well-nourished female in no acute distress.  HEENT: normocephalic HEART: normal rate RESP: normal effort ABDOMEN: Soft, non-tender, gravid appropriate for gestational age EXTREMITIES: Nontender, no edema NEURO: alert and oriented    FHT:  Baseline 145 , moderate variability, accelerations present (10x10), no decelerations Contractions: None on toco or to palpation   Labs: Results for orders placed during the hospital encounter of 09/17/13 (from the past 24 hour(s))  URINALYSIS, ROUTINE W REFLEX MICROSCOPIC     Status: None   Collection Time    09/17/13  1:00 PM      Result Value Ref Range   Color, Urine YELLOW  YELLOW   APPearance CLEAR  CLEAR   Specific Gravity, Urine 1.025  1.005 - 1.030   pH 6.0  5.0 - 8.0   Glucose, UA NEGATIVE  NEGATIVE mg/dL   Hgb urine dipstick NEGATIVE  NEGATIVE   Bilirubin Urine NEGATIVE  NEGATIVE   Ketones, ur NEGATIVE  NEGATIVE mg/dL   Protein, ur NEGATIVE  NEGATIVE mg/dL   Urobilinogen, UA 0.2  0.0 - 1.0 mg/dL   Nitrite NEGATIVE  NEGATIVE   Leukocytes, UA NEGATIVE  NEGATIVE  Assessment: 1. Decreased fetal movement in pregnancy   2. NST (non-stress test) reactive   3. Round ligament pain     Plan: Discharge home PTL precautions and fetal kick counts Teaching about round ligament pain, recommend warm bath, heat, increase PO fluids, maternity support belt. F/U with Dr Tamela Oddi Return to MAU as needed  Follow-up Information   Follow up with Roseanna Rainbow, MD.   Specialty:  Obstetrics and Gynecology   Contact information:   [address] [place] [address] [phone]        Medication List         prenatal multivitamin Tabs tablet  Take 1 tablet by mouth daily at 12 noon.        Sharen Counter Certified Nurse-Midwife 09/17/2013 1:55 PM

## 2013-10-08 ENCOUNTER — Other Ambulatory Visit: Payer: Self-pay | Admitting: *Deleted

## 2013-10-08 ENCOUNTER — Ambulatory Visit (INDEPENDENT_AMBULATORY_CARE_PROVIDER_SITE_OTHER): Payer: Medicaid Other | Admitting: Obstetrics & Gynecology

## 2013-10-08 ENCOUNTER — Encounter: Payer: Self-pay | Admitting: Obstetrics & Gynecology

## 2013-10-08 VITALS — BP 118/77 | Temp 97.6°F | Wt 197.0 lb

## 2013-10-08 DIAGNOSIS — Z3480 Encounter for supervision of other normal pregnancy, unspecified trimester: Secondary | ICD-10-CM

## 2013-10-08 DIAGNOSIS — O26849 Uterine size-date discrepancy, unspecified trimester: Secondary | ICD-10-CM

## 2013-10-08 LAB — POCT URINALYSIS DIPSTICK
Bilirubin, UA: NEGATIVE
KETONES UA: NEGATIVE
Leukocytes, UA: NEGATIVE
Nitrite, UA: NEGATIVE
PH UA: 6
RBC UA: NEGATIVE
Spec Grav, UA: 1.015
Urobilinogen, UA: NEGATIVE

## 2013-10-08 NOTE — Progress Notes (Signed)
Pulse 97, c/o right calf pain for the past month with bilateral swelling of feet.  Negative Homans' sign.  Counseled UJ:[id number]:excessive weight gain.

## 2013-10-08 NOTE — Patient Instructions (Signed)

## 2013-10-11 ENCOUNTER — Other Ambulatory Visit: Payer: Self-pay | Admitting: Obstetrics & Gynecology

## 2013-10-11 ENCOUNTER — Ambulatory Visit (HOSPITAL_COMMUNITY)
Admission: RE | Admit: 2013-10-11 | Discharge: 2013-10-11 | Disposition: A | Discharge status: Home / Self Care | Payer: Medicaid Other | Source: Ambulatory Visit | Attending: Obstetrics & Gynecology | Admitting: Obstetrics & Gynecology

## 2013-10-11 DIAGNOSIS — O26849 Uterine size-date discrepancy, unspecified trimester: Secondary | ICD-10-CM

## 2013-10-11 DIAGNOSIS — O3660X0 Maternal care for excessive fetal growth, unspecified trimester, not applicable or unspecified: Secondary | ICD-10-CM | POA: Insufficient documentation

## 2013-10-11 DIAGNOSIS — Z3689 Encounter for other specified antenatal screening: Secondary | ICD-10-CM | POA: Insufficient documentation

## 2013-10-14 ENCOUNTER — Encounter (HOSPITAL_COMMUNITY): Payer: Self-pay | Admitting: *Deleted

## 2013-10-22 ENCOUNTER — Ambulatory Visit (INDEPENDENT_AMBULATORY_CARE_PROVIDER_SITE_OTHER): Payer: Medicaid Other | Admitting: Obstetrics & Gynecology

## 2013-10-22 ENCOUNTER — Encounter: Payer: Self-pay | Admitting: Obstetrics & Gynecology

## 2013-10-22 VITALS — BP 134/81 | HR 103 | Temp 98.5°F | Wt 201.0 lb

## 2013-10-22 DIAGNOSIS — Z3400 Encounter for supervision of normal first pregnancy, unspecified trimester: Secondary | ICD-10-CM

## 2013-10-22 LAB — POCT URINALYSIS DIPSTICK
BILIRUBIN UA: NEGATIVE
Ketones, UA: NEGATIVE
Nitrite, UA: NEGATIVE
SPEC GRAV UA: 1.015
Urobilinogen, UA: NEGATIVE
pH, UA: 6

## 2013-10-22 NOTE — Progress Notes (Signed)
Subjective:    [name] is a 23 y.o. female being seen today for her obstetrical visit. She is at 1277w6d gestation. Patient reports no complaints. Fetal movement: normal.  Problem List Items Addressed This Visit   Supervision of normal first pregnancy - Primary   Relevant Orders      POCT urinalysis dipstick (Completed)     Patient Active Problem List   Diagnosis Date Noted  . Supervision of normal first pregnancy 05/03/2013   Objective:    BP 134/81  Pulse 103  Temp(Src) 98.5 F (36.9 C)  Wt 91.173 kg (201 lb)  LMP 03/06/2013 FHT:  140 BPM  Uterine Size: size equals dates  Presentation: cephalic     Assessment:    Pregnancy @ 1077w6d weeks   Plan:    Labs reviewed, problem list updated Pediatrician: discussed. Infant feeding: plans to breastfeed. Maternity leave: discussed. Cigarette smoking: never smoked. Orders Placed This Encounter  Procedures  . POCT urinalysis dipstick    Follow up in 2 Weeks.

## 2013-10-22 NOTE — Patient Instructions (Signed)
Third Trimester of Pregnancy  The third trimester is from week 29 through week 42, months 7 through 9. The third trimester is a time when the fetus is growing rapidly. At the end of the ninth month, the fetus is about 20 inches in length and weighs 6 10 pounds.   BODY CHANGES  Your body goes through many changes during pregnancy. The changes vary from woman to woman.    Your weight will continue to increase. You can expect to gain 25 35 pounds (11 16 kg) by the end of the pregnancy.   You may begin to get stretch marks on your hips, abdomen, and breasts.   You may urinate more often because the fetus is moving lower into your pelvis and pressing on your bladder.   You may develop or continue to have heartburn as a result of your pregnancy.   You may develop constipation because certain hormones are causing the muscles that push waste through your intestines to slow down.   You may develop hemorrhoids or swollen, bulging veins (varicose veins).   You may have pelvic pain because of the weight gain and pregnancy hormones relaxing your joints between the bones in your pelvis. Back aches may result from over exertion of the muscles supporting your posture.   Your breasts will continue to grow and be tender. A yellow discharge may leak from your breasts called colostrum.   Your belly button may stick out.   You may feel short of breath because of your expanding uterus.   You may notice the fetus "dropping," or moving lower in your abdomen.   You may have a bloody mucus discharge. This usually occurs a few days to a week before labor begins.   Your cervix becomes thin and soft (effaced) near your due date.  WHAT TO EXPECT AT YOUR PRENATAL EXAMS   You will have prenatal exams every 2 weeks until week 36. Then, you will have weekly prenatal exams. During a routine prenatal visit:   You will be weighed to make sure you and the fetus are growing normally.   Your blood pressure is taken.   Your abdomen will be  measured to track your baby's growth.   The fetal heartbeat will be listened to.   Any test results from the previous visit will be discussed.   You may have a cervical check near your due date to see if you have effaced.  At around 36 weeks, your caregiver will check your cervix. At the same time, your caregiver will also perform a test on the secretions of the vaginal tissue. This test is to determine if a type of bacteria, Group B streptococcus, is present. Your caregiver will explain this further.  Your caregiver may ask you:   What your birth plan is.   How you are feeling.   If you are feeling the baby move.   If you have had any abnormal symptoms, such as leaking fluid, bleeding, severe headaches, or abdominal cramping.   If you have any questions.  Other tests or screenings that may be performed during your third trimester include:   Blood tests that check for low iron levels (anemia).   Fetal testing to check the health, activity level, and growth of the fetus. Testing is done if you have certain medical conditions or if there are problems during the pregnancy.  FALSE LABOR  You may feel small, irregular contractions that eventually go away. These are called Braxton Hicks contractions, or   false labor. Contractions may last for hours, days, or even weeks before true labor sets in. If contractions come at regular intervals, intensify, or become painful, it is best to be seen by your caregiver.   SIGNS OF LABOR    Menstrual-like cramps.   Contractions that are 5 minutes apart or less.   Contractions that start on the top of the uterus and spread down to the lower abdomen and back.   A sense of increased pelvic pressure or back pain.   A watery or bloody mucus discharge that comes from the vagina.  If you have any of these signs before the 37th week of pregnancy, call your caregiver right away. You need to go to the hospital to get checked immediately.  HOME CARE INSTRUCTIONS    Avoid all  smoking, herbs, alcohol, and unprescribed drugs. These chemicals affect the formation and growth of the baby.   Follow your caregiver's instructions regarding medicine use. There are medicines that are either safe or unsafe to take during pregnancy.   Exercise only as directed by your caregiver. Experiencing uterine cramps is a good sign to stop exercising.   Continue to eat regular, healthy meals.   Wear a good support bra for breast tenderness.   Do not use hot tubs, steam rooms, or saunas.   Wear your seat belt at all times when driving.   Avoid raw meat, uncooked cheese, cat litter boxes, and soil used by cats. These carry germs that can cause birth defects in the baby.   Take your prenatal vitamins.   Try taking a stool softener (if your caregiver approves) if you develop constipation. Eat more high-fiber foods, such as fresh vegetables or fruit and whole grains. Drink plenty of fluids to keep your urine clear or pale yellow.   Take warm sitz baths to soothe any pain or discomfort caused by hemorrhoids. Use hemorrhoid cream if your caregiver approves.   If you develop varicose veins, wear support hose. Elevate your feet for 15 minutes, 3 4 times a day. Limit salt in your diet.   Avoid heavy lifting, wear low heal shoes, and practice good posture.   Rest a lot with your legs elevated if you have leg cramps or low back pain.   Visit your dentist if you have not gone during your pregnancy. Use a soft toothbrush to brush your teeth and be gentle when you floss.   A sexual relationship may be continued unless your caregiver directs you otherwise.   Do not travel far distances unless it is absolutely necessary and only with the approval of your caregiver.   Take prenatal classes to understand, practice, and ask questions about the labor and delivery.   Make a trial run to the hospital.   Pack your hospital bag.   Prepare the baby's nursery.   Continue to go to all your prenatal visits as directed  by your caregiver.  SEEK MEDICAL CARE IF:   You are unsure if you are in labor or if your water has broken.   You have dizziness.   You have mild pelvic cramps, pelvic pressure, or nagging pain in your abdominal area.   You have persistent nausea, vomiting, or diarrhea.   You have a bad smelling vaginal discharge.   You have pain with urination.  SEEK IMMEDIATE MEDICAL CARE IF:    You have a fever.   You are leaking fluid from your vagina.   You have spotting or bleeding from your vagina.     You have severe abdominal cramping or pain.   You have rapid weight loss or gain.   You have shortness of breath with chest pain.   You notice sudden or extreme swelling of your face, hands, ankles, feet, or legs.   You have not felt your baby move in over an hour.   You have severe headaches that do not go away with medicine.   You have vision changes.  Document Released: 06/08/2001 Document Revised: 02/14/2013 Document Reviewed: 08/15/2012  [employer] Patient Information 2014 [employer].

## 2013-11-05 ENCOUNTER — Encounter: Payer: Medicaid Other | Admitting: Obstetrics & Gynecology

## 2013-11-12 ENCOUNTER — Encounter: Payer: Medicaid Other | Admitting: Obstetrics & Gynecology

## 2013-11-12 ENCOUNTER — Ambulatory Visit (INDEPENDENT_AMBULATORY_CARE_PROVIDER_SITE_OTHER): Payer: Medicaid Other | Admitting: Obstetrics & Gynecology

## 2013-11-12 ENCOUNTER — Encounter: Payer: Self-pay | Admitting: Obstetrics & Gynecology

## 2013-11-12 VITALS — BP 130/81 | HR 92 | Temp 98.5°F | Wt 209.0 lb

## 2013-11-12 DIAGNOSIS — Z3400 Encounter for supervision of normal first pregnancy, unspecified trimester: Secondary | ICD-10-CM

## 2013-11-12 LAB — POCT URINALYSIS DIPSTICK
GLUCOSE UA: NEGATIVE
Ketones, UA: NEGATIVE
Leukocytes, UA: NEGATIVE
Nitrite, UA: NEGATIVE
PH UA: 5
Protein, UA: NEGATIVE
RBC UA: NEGATIVE
SPEC GRAV UA: 1.01

## 2013-11-12 LAB — OB RESULTS CONSOLE GC/CHLAMYDIA
Chlamydia: NEGATIVE
GC PROBE AMP, GENITAL: NEGATIVE

## 2013-11-12 NOTE — Progress Notes (Signed)
Subjective:    Pamula Kathie RhodesS Corine ShelterWatkins is a 23 y.o. female being seen today for her obstetrical visit. She is at 4267w6d gestation. Patient reports no complaints. Fetal movement: normal.  Problem List Items Addressed This Visit   Supervision of normal first pregnancy - Primary   Relevant Orders      POCT urinalysis dipstick      Strep B DNA probe      GC/Chlamydia Probe Amp      US OB Follow Up     Patient Active Problem List   Diagnosis Date Noted  . Supervision of normal first pregnancy 05/03/2013   Objective:    BP 130/81  Pulse 92  Temp(Src) 98.5 F (36.9 C)  Wt 94.802 kg (209 lb)  LMP 03/06/2013 FHT:  130 BPM  Uterine Size: size equals dates  Presentation: cephalic     Assessment:    Pregnancy @ 5967w6d weeks   Plan:    Labs reviewed, problem list updated  Orders Placed This Encounter  Procedures  . Strep B DNA probe  . GC/Chlamydia Probe Amp  . US OB Follow Up    Standing Status: Future     Number of Occurrences:      Standing Expiration Date: 01/13/2015    Order Specific Question:  Reason for Exam (SYMPTOM  OR DIAGNOSIS REQUIRED)    Answer:  v22.0     Comments:  for growth    Order Specific Question:  Preferred imaging location?    Answer:  Internal  . POCT urinalysis dipstick    Follow up in 2 Weeks.

## 2013-11-12 NOTE — Patient Instructions (Signed)
Patient information: Group B streptococcus and pregnancy (Beyond the Basics)  Authors Karen M Puopolo, MD, PhD Carol J Baker, MD Section Editors Charles J Lockwood, MD Daniel J Sexton, MD Deputy Editor Vanessa A Barss, MD Disclosures  All topics are updated as new evidence becomes available and our peer review process is complete.  Literature review current through: Feb 2014.  This topic last updated: Dec 27, 2011.  INTRODUCTION - Group B streptococcus (GBS) is a bacterium that can cause serious infections in pregnant women and newborn babies. GBS is one of many types of streptococcal bacteria, sometimes called "strep." This article discusses GBS, its effect on pregnant women and infants, and ways to prevent complications of GBS. More detailed information about GBS is available by subscription. (See "Group B streptococcal infection in pregnant women".) WHAT IS GROUP B STREP INFECTION? - GBS is commonly found in the digestive system and the vagina. In healthy adults, GBS is not harmful and does not cause problems. But in pregnant women and newborn infants, being infected with GBS can cause serious illness. Approximately one in three to four pregnant women in the [place] carries GBS in their gastrointestinal system and/or in their vagina. Carrying GBS is not the same as being infected. Carriers are not sick and do not need treatment during pregnancy. There is no treatment that can stop you from carrying GBS.  Pregnant women who are carriers of GBS infrequently become infected with GBS. GBS can cause urinary tract infections, infection of the amniotic fluid (bag of water), and infection of the uterus after delivery. GBS infections during pregnancy may lead to preterm labor.  Pregnant women who carry GBS can pass on the bacteria to their newborns, and some of those babies become infected with GBS. Newborns who are infected with GBS can develop pneumonia (lung infection), septicemia (blood infection), or  meningitis (infection of the lining of the brain and spinal cord). These complications can be prevented by giving intravenous antibiotics during labor to any woman who is at risk of GBS infection. You are at risk of GBS infection if: You have a urine culture during your current pregnancy showing GBS  You have a vaginal and rectal culture during your current pregnancy showing GBS  You had an infant infected with GBS in the past GROUP B STREP PREVENTION - Most doctors and nurses recommend a urine culture early in your pregnancy to be sure that you do not have a bladder infection without symptoms. If you urine culture shows GBS or other bacteria, you may be treated with an antibiotic. If you have symptoms of urinary infection, such as pain with urination, any time during your pregnancy, a urine culture is done. If GBS grows from the urine culture, it should be treated with an antibiotic, and you should also receive intravenous antibiotics during labor. Expert groups recommend that all pregnant women have a GBS culture at 35 to 37 weeks of pregnancy. The culture is done by swabbing the vagina and rectum. If your GBS culture is positive, you will be given an intravenous antibiotic during labor. If you have preterm labor, the culture is done then and an intravenous antibiotic is given until the baby is born or the labor is stopped by your health care provider. If you have a positive GBS culture and you have an allergy to penicillin, be sure your doctor and nurse are aware of this allergy and tell them what happened with the allergy. If you had only a rash or itching, this   is not a serious allergy, and you can receive a common drug related to the penicillin. If you had a serious allergy (for example, trouble breathing, swelling of your face) you may need an additional test to determine which antibiotic should be used during labor. Being treated with an antibiotic during labor greatly reduces the chance that you or  your newborn will develop infections related to GBS. It is important to note that young infants up to age 3 months can also develop septicemia, meningitis and other serious infections from GBS. Being treated with an antibiotic during labor does not reduce the chance that your baby will develop this later type of infection. There is currently no known way of preventing this later-onset GBS disease. WHERE TO GET MORE INFORMATION - Your healthcare provider is the best source of information for questions and concerns related to your medical problem.  

## 2013-11-13 LAB — GC/CHLAMYDIA PROBE AMP
CT PROBE, AMP APTIMA: NEGATIVE
GC PROBE AMP APTIMA: NEGATIVE

## 2013-11-14 ENCOUNTER — Ambulatory Visit (INDEPENDENT_AMBULATORY_CARE_PROVIDER_SITE_OTHER): Payer: Medicaid Other

## 2013-11-14 DIAGNOSIS — O365990 Maternal care for other known or suspected poor fetal growth, unspecified trimester, not applicable or unspecified: Secondary | ICD-10-CM

## 2013-11-14 DIAGNOSIS — Z3400 Encounter for supervision of normal first pregnancy, unspecified trimester: Secondary | ICD-10-CM

## 2013-11-14 LAB — US OB FOLLOW UP

## 2013-11-14 LAB — STREP B DNA PROBE: GBSP: DETECTED

## 2013-11-17 ENCOUNTER — Encounter: Payer: Self-pay | Admitting: Obstetrics & Gynecology

## 2013-11-17 DIAGNOSIS — O99820 Streptococcus B carrier state complicating pregnancy: Secondary | ICD-10-CM | POA: Insufficient documentation

## 2013-11-21 ENCOUNTER — Ambulatory Visit (INDEPENDENT_AMBULATORY_CARE_PROVIDER_SITE_OTHER): Payer: Medicaid Other | Admitting: Obstetrics & Gynecology

## 2013-11-21 ENCOUNTER — Encounter: Payer: Self-pay | Admitting: Obstetrics & Gynecology

## 2013-11-21 ENCOUNTER — Encounter: Payer: Self-pay | Admitting: *Deleted

## 2013-11-21 VITALS — BP 135/79 | HR 99 | Temp 98.6°F | Wt 214.0 lb

## 2013-11-21 DIAGNOSIS — Z3400 Encounter for supervision of normal first pregnancy, unspecified trimester: Secondary | ICD-10-CM

## 2013-11-21 LAB — POCT URINALYSIS DIPSTICK
BILIRUBIN UA: NEGATIVE
Blood, UA: NEGATIVE
Glucose, UA: NEGATIVE
Ketones, UA: NEGATIVE
LEUKOCYTES UA: NEGATIVE
Nitrite, UA: NEGATIVE
PH UA: 5
SPEC GRAV UA: 1.02
Urobilinogen, UA: NEGATIVE

## 2013-11-21 NOTE — Progress Notes (Signed)
Subjective:    Jaicey Kathie Rhodes Mcbrayer is a 23 y.o. female being seen today for her obstetrical visit. She is at [redacted]w[redacted]d gestation. Patient reports no complaints. Fetal movement: normal.  Problem List Items Addressed This Visit   None     Patient Active Problem List   Diagnosis Date Noted  . GBS (group B Streptococcus carrier), +RV culture, currently pregnant 11/17/2013  . Supervision of normal first pregnancy 05/03/2013    Objective:    BP 135/79  Pulse 99  Temp(Src) 98.6 F (37 C)  Wt 97.07 kg (214 lb)  LMP 03/06/2013 FHT: 140 BPM  Uterine Size: size equals dates  Presentations: cephalic     Assessment:    Pregnancy @ [redacted]w[redacted]d weeks   Plan:   Plans for delivery: Vaginal anticipated; labs reviewed; problem list updated Infant feeding: plans to breastfeed. L&D discussion: symptoms of labor, discussed when to call, discussed what number to call, anesthetic/analgesic options reviewed and delivering clinician:  plans no preference. Postpartum supports and preparation: circumcision discussed and contraception plans discussed.  Follow up in 1 Week.

## 2013-11-21 NOTE — Addendum Note (Signed)
Addended by: Odessa Fleming on: 11/21/2013 05:34 PM   Modules accepted: Orders

## 2013-11-21 NOTE — Patient Instructions (Signed)
Patient information: Group B streptococcus and pregnancy (Beyond the Basics)  Authors Karen M Puopolo, MD, PhD Carol J Baker, MD Section Editors Charles J Lockwood, MD Daniel J Sexton, MD Deputy Editor Vanessa A Barss, MD Disclosures  All topics are updated as new evidence becomes available and our peer review process is complete.  Literature review current through: Feb 2014.  This topic last updated: Dec 27, 2011.  INTRODUCTION - Group B streptococcus (GBS) is a bacterium that can cause serious infections in pregnant women and newborn babies. GBS is one of many types of streptococcal bacteria, sometimes called "strep." This article discusses GBS, its effect on pregnant women and infants, and ways to prevent complications of GBS. More detailed information about GBS is available by subscription. (See "Group B streptococcal infection in pregnant women".) WHAT IS GROUP B STREP INFECTION? - GBS is commonly found in the digestive system and the vagina. In healthy adults, GBS is not harmful and does not cause problems. But in pregnant women and newborn infants, being infected with GBS can cause serious illness. Approximately one in three to four pregnant women in the [place] carries GBS in their gastrointestinal system and/or in their vagina. Carrying GBS is not the same as being infected. Carriers are not sick and do not need treatment during pregnancy. There is no treatment that can stop you from carrying GBS.  Pregnant women who are carriers of GBS infrequently become infected with GBS. GBS can cause urinary tract infections, infection of the amniotic fluid (bag of water), and infection of the uterus after delivery. GBS infections during pregnancy may lead to preterm labor.  Pregnant women who carry GBS can pass on the bacteria to their newborns, and some of those babies become infected with GBS. Newborns who are infected with GBS can develop pneumonia (lung infection), septicemia (blood infection), or  meningitis (infection of the lining of the brain and spinal cord). These complications can be prevented by giving intravenous antibiotics during labor to any woman who is at risk of GBS infection. You are at risk of GBS infection if: You have a urine culture during your current pregnancy showing GBS  You have a vaginal and rectal culture during your current pregnancy showing GBS  You had an infant infected with GBS in the past GROUP B STREP PREVENTION - Most doctors and nurses recommend a urine culture early in your pregnancy to be sure that you do not have a bladder infection without symptoms. If you urine culture shows GBS or other bacteria, you may be treated with an antibiotic. If you have symptoms of urinary infection, such as pain with urination, any time during your pregnancy, a urine culture is done. If GBS grows from the urine culture, it should be treated with an antibiotic, and you should also receive intravenous antibiotics during labor. Expert groups recommend that all pregnant women have a GBS culture at 35 to 37 weeks of pregnancy. The culture is done by swabbing the vagina and rectum. If your GBS culture is positive, you will be given an intravenous antibiotic during labor. If you have preterm labor, the culture is done then and an intravenous antibiotic is given until the baby is born or the labor is stopped by your health care provider. If you have a positive GBS culture and you have an allergy to penicillin, be sure your doctor and nurse are aware of this allergy and tell them what happened with the allergy. If you had only a rash or itching, this   is not a serious allergy, and you can receive a common drug related to the penicillin. If you had a serious allergy (for example, trouble breathing, swelling of your face) you may need an additional test to determine which antibiotic should be used during labor. Being treated with an antibiotic during labor greatly reduces the chance that you or  your newborn will develop infections related to GBS. It is important to note that young infants up to age 3 months can also develop septicemia, meningitis and other serious infections from GBS. Being treated with an antibiotic during labor does not reduce the chance that your baby will develop this later type of infection. There is currently no known way of preventing this later-onset GBS disease. WHERE TO GET MORE INFORMATION - Your healthcare provider is the best source of information for questions and concerns related to your medical problem.  

## 2013-11-21 NOTE — Progress Notes (Signed)
Patient reports she is having swelling in her feet and hands.

## 2013-11-26 ENCOUNTER — Ambulatory Visit (INDEPENDENT_AMBULATORY_CARE_PROVIDER_SITE_OTHER): Payer: Medicaid Other | Admitting: Obstetrics & Gynecology

## 2013-11-26 ENCOUNTER — Encounter: Payer: Self-pay | Admitting: Obstetrics & Gynecology

## 2013-11-26 VITALS — BP 137/81 | HR 91 | Temp 97.9°F | Wt 214.0 lb

## 2013-11-26 DIAGNOSIS — Z3400 Encounter for supervision of normal first pregnancy, unspecified trimester: Secondary | ICD-10-CM

## 2013-11-26 DIAGNOSIS — O365990 Maternal care for other known or suspected poor fetal growth, unspecified trimester, not applicable or unspecified: Secondary | ICD-10-CM

## 2013-11-26 LAB — POCT URINALYSIS DIPSTICK
Glucose, UA: NEGATIVE
KETONES UA: NEGATIVE
LEUKOCYTES UA: NEGATIVE
NITRITE UA: NEGATIVE
PH UA: 6
Protein, UA: NEGATIVE
RBC UA: NEGATIVE
Spec Grav, UA: 1.01

## 2013-11-26 NOTE — Progress Notes (Signed)
Subjective:    [name] is a 23 y.o. female being seen today for her obstetrical visit. She is at [redacted]w[redacted]d  gestation. Patient reports no complaints. Fetal movement: decreased.  Problem List Items Addressed This Visit   Supervision of normal first pregnancy - Primary   Relevant Orders      POCT urinalysis dipstick (Completed)     Patient Active Problem List   Diagnosis Date Noted  . GBS (group B Streptococcus carrier), +RV culture, currently pregnant 11/17/2013  . Supervision of normal first pregnancy 05/03/2013    Objective:    BP 137/81  Pulse 91  Temp(Src) 97.9 F (36.6 C)  Wt 97.07 kg (214 lb)  LMP 03/06/2013 FHT: 140 BPM  Uterine Size: size equals dates  Presentations: cephalic     Assessment:    Pregnancy @ [redacted]w[redacted]d  weeks   Plan:   Plans for delivery: Vaginal anticipated; labs reviewed; problem list updated Kick counts Follow up in 1 Week.

## 2013-11-26 NOTE — Patient Instructions (Signed)
Fetal Movement Counts Patient Name: __________________________________________________ Patient Due Date: ____________________ Performing a fetal movement count is highly recommended in high-risk pregnancies, but it is good for every pregnant woman to do. Your caregiver may ask you to start counting fetal movements at 28 weeks of the pregnancy. Fetal movements often increase:  After eating a full meal.  After physical activity.  After eating or drinking something sweet or cold.  At rest. Pay attention to when you feel the baby is most active. This will help you notice a pattern of your baby's sleep and wake cycles and what factors contribute to an increase in fetal movement. It is important to perform a fetal movement count at the same time each day when your baby is normally most active.  HOW TO COUNT FETAL MOVEMENTS 1. Find a quiet and comfortable area to sit or lie down on your left side. Lying on your left side provides the best blood and oxygen circulation to your baby. 2. Write down the day and time on a sheet of paper or in a journal. 3. Start counting kicks, flutters, swishes, rolls, or jabs in a 2 hour period. You should feel at least 10 movements within 2 hours. 4. If you do not feel 10 movements in 2 hours, wait 2 3 hours and count again. Look for a change in the pattern or not enough counts in 2 hours. SEEK MEDICAL CARE IF:  You feel less than 10 counts in 2 hours, tried twice.  There is no movement in over an hour.  The pattern is changing or taking longer each day to reach 10 counts in 2 hours.  You feel the baby is not moving as he or she usually does. Date: ____________ Movements: ____________ Start time: ____________ Finish time: ____________  Date: ____________ Movements: ____________ Start time: ____________ Finish time: ____________ Date: ____________ Movements: ____________ Start time: ____________ Finish time: ____________ Date: ____________ Movements: ____________  Start time: ____________ Finish time: ____________ Date: ____________ Movements: ____________ Start time: ____________ Finish time: ____________ Date: ____________ Movements: ____________ Start time: ____________ Finish time: ____________ Date: ____________ Movements: ____________ Start time: ____________ Finish time: ____________ Date: ____________ Movements: ____________ Start time: ____________ Finish time: ____________  Date: ____________ Movements: ____________ Start time: ____________ Finish time: ____________ Date: ____________ Movements: ____________ Start time: ____________ Finish time: ____________ Date: ____________ Movements: ____________ Start time: ____________ Finish time: ____________ Date: ____________ Movements: ____________ Start time: ____________ Finish time: ____________ Date: ____________ Movements: ____________ Start time: ____________ Finish time: ____________ Date: ____________ Movements: ____________ Start time: ____________ Finish time: ____________ Date: ____________ Movements: ____________ Start time: ____________ Finish time: ____________  Date: ____________ Movements: ____________ Start time: ____________ Finish time: ____________ Date: ____________ Movements: ____________ Start time: ____________ Finish time: ____________ Date: ____________ Movements: ____________ Start time: ____________ Finish time: ____________ Date: ____________ Movements: ____________ Start time: ____________ Finish time: ____________ Date: ____________ Movements: ____________ Start time: ____________ Finish time: ____________ Date: ____________ Movements: ____________ Start time: ____________ Finish time: ____________ Date: ____________ Movements: ____________ Start time: ____________ Finish time: ____________  Date: ____________ Movements: ____________ Start time: ____________ Finish time: ____________ Date: ____________ Movements: ____________ Start time: ____________ Finish time:  ____________ Date: ____________ Movements: ____________ Start time: ____________ Finish time: ____________ Date: ____________ Movements: ____________ Start time: ____________ Finish time: ____________ Date: ____________ Movements: ____________ Start time: ____________ Finish time: ____________ Date: ____________ Movements: ____________ Start time: ____________ Finish time: ____________ Date: ____________ Movements: ____________ Start time: ____________ Finish time: ____________  Date: ____________ Movements: ____________ Start time: ____________ Finish   time: ____________ Date: ____________ Movements: ____________ Start time: ____________ Finish time: ____________ Date: ____________ Movements: ____________ Start time: ____________ Finish time: ____________ Date: ____________ Movements: ____________ Start time: ____________ Finish time: ____________ Date: ____________ Movements: ____________ Start time: ____________ Finish time: ____________ Date: ____________ Movements: ____________ Start time: ____________ Finish time: ____________ Date: ____________ Movements: ____________ Start time: ____________ Finish time: ____________  Date: ____________ Movements: ____________ Start time: ____________ Finish time: ____________ Date: ____________ Movements: ____________ Start time: ____________ Finish time: ____________ Date: ____________ Movements: ____________ Start time: ____________ Finish time: ____________ Date: ____________ Movements: ____________ Start time: ____________ Finish time: ____________ Date: ____________ Movements: ____________ Start time: ____________ Finish time: ____________ Date: ____________ Movements: ____________ Start time: ____________ Finish time: ____________ Date: ____________ Movements: ____________ Start time: ____________ Finish time: ____________  Date: ____________ Movements: ____________ Start time: ____________ Finish time: ____________ Date: ____________ Movements:  ____________ Start time: ____________ Finish time: ____________ Date: ____________ Movements: ____________ Start time: ____________ Finish time: ____________ Date: ____________ Movements: ____________ Start time: ____________ Finish time: ____________ Date: ____________ Movements: ____________ Start time: ____________ Finish time: ____________ Date: ____________ Movements: ____________ Start time: ____________ Finish time: ____________ Date: ____________ Movements: ____________ Start time: ____________ Finish time: ____________  Date: ____________ Movements: ____________ Start time: ____________ Finish time: ____________ Date: ____________ Movements: ____________ Start time: ____________ Finish time: ____________ Date: ____________ Movements: ____________ Start time: ____________ Finish time: ____________ Date: ____________ Movements: ____________ Start time: ____________ Finish time: ____________ Date: ____________ Movements: ____________ Start time: ____________ Finish time: ____________ Date: ____________ Movements: ____________ Start time: ____________ Finish time: ____________ Document Released: 07/14/2006 Document Revised: 05/31/2012 Document Reviewed: 04/10/2012 [employer] Patient Information 2014 [employer].  

## 2013-12-03 ENCOUNTER — Encounter: Payer: Medicaid Other | Admitting: Obstetrics & Gynecology

## 2013-12-03 ENCOUNTER — Ambulatory Visit (INDEPENDENT_AMBULATORY_CARE_PROVIDER_SITE_OTHER): Payer: Medicaid Other | Admitting: Obstetrics & Gynecology

## 2013-12-03 ENCOUNTER — Encounter: Payer: Self-pay | Admitting: Obstetrics & Gynecology

## 2013-12-03 VITALS — BP 124/86 | HR 89 | Temp 98.6°F | Wt 214.0 lb

## 2013-12-03 DIAGNOSIS — Z3400 Encounter for supervision of normal first pregnancy, unspecified trimester: Secondary | ICD-10-CM

## 2013-12-03 LAB — POCT URINALYSIS DIPSTICK
Blood, UA: NEGATIVE
GLUCOSE UA: NEGATIVE
Ketones, UA: NEGATIVE
Leukocytes, UA: NEGATIVE
NITRITE UA: NEGATIVE
Protein, UA: 1
Spec Grav, UA: 1.01
pH, UA: 6

## 2013-12-03 NOTE — Progress Notes (Signed)
Subjective:    [name] is a 23 y.o. female being seen today for her obstetrical visit. She is at [redacted]w[redacted]d  gestation. Patient reports heartburn and nausea. Fetal movement: normal.  Problem List Items Addressed This Visit   Supervision of normal first pregnancy - Primary   Relevant Orders      POCT urinalysis dipstick     Patient Active Problem List   Diagnosis Date Noted  . GBS (group B Streptococcus carrier), +RV culture, currently pregnant 11/17/2013  . Supervision of normal first pregnancy 05/03/2013    Objective:    BP 124/86  Pulse 89  Temp(Src) 98.6 F (37 C)  Wt 97.07 kg (214 lb)  LMP 03/06/2013 FHT: 140 BPM  Uterine Size: size equals dates  Presentations: cephalic     Assessment:    Pregnancy @ [redacted]w[redacted]d  weeks  GERD/nausea Plan:   Plans for delivery: Vaginal anticipated; labs reviewed; problem list updated H2 blocker/pt education materials provided [id number] in pregnancy Follow up in 1 Week.

## 2013-12-03 NOTE — Patient Instructions (Signed)
Heartburn During Pregnancy   Heartburn is a burning sensation in the chest caused by stomach acid backing up into the esophagus. Heartburn is common in pregnancy because a certain hormone (progesterone) is released when a woman is pregnant. The progesterone hormone may relax the valve that separates the esophagus from the stomach. This allows acid to go up into the esophagus, causing heartburn. Heartburn may also happen in pregnancy because the enlarging uterus pushes up on the stomach, which pushes more acid into the esophagus. This is especially true in the later stages of pregnancy. Heartburn problems usually go away after giving birth.  CAUSES   Heartburn is caused by stomach acid backing up into the esophagus. During pregnancy, this may result from various things, including:   · The progesterone hormone.  · Changing hormone levels.  · The growing uterus pushing stomach acid upward.  · Large meals.  · Certain foods and drinks.  · Exercise.  · Increased acid production.  SIGNS AND SYMPTOMS   · Burning pain in the chest or lower throat.  · Bitter taste in the mouth.  · Coughing.  DIAGNOSIS   Your health care provider will typically diagnose heartburn by taking a careful history of your concern. Blood tests may be done to check for a certain type of bacteria that is associated with heartburn. Sometimes, heartburn is diagnosed by prescribing a heartburn medicine to see if the symptoms improve. In some cases, a procedure called an endoscopy may be done. In this procedure, a tube with a light and a camera on the end (endoscope) is used to examine the esophagus and the stomach.  TREATMENT   Treatment will vary depending on the severity of your symptoms. Your health care provider may recommend:  · Over-the-counter medicines (antacids, acid reducers) for mild heartburn.  · Prescription medicines to decrease stomach acid or to protect your stomach lining.  · Certain changes in your diet.  · Elevating the head of your bed  by putting blocks under the legs. This helps prevent stomach acid from backing up into the esophagus when you are lying down.  HOME CARE INSTRUCTIONS   · Only take over-the-counter or prescription medicines as directed by your health care provider.  · Raise the head of your bed by putting blocks under the legs if instructed to do so by your health care provider. Sleeping with more pillows is not effective because it only changes the position of your head.  · Do not exercise right after eating.  · Avoid eating 2 3 hours before bed. Do not lie down right after eating.  · Eat small meals throughout the day instead of three large meals.  · Identify foods and beverages that make your symptoms worse and avoid them. Foods you may want to avoid include:  · Peppers.  · Chocolate.  · High-fat foods, including fried foods.  · Spicy foods.  · Garlic and onions.  · Citrus fruits, including oranges, grapefruit, lemons, and limes.  · Food containing tomatoes or tomato products.  · Mint.  · Carbonated and caffeinated drinks.  · Vinegar.  SEEK MEDICAL CARE IF:  · You have abdominal pain of any kind.  · You feel burning in your upper abdomen or chest, especially after eating or lying down.  · You have nausea and vomiting.  · Your stomach feels upset after you eat.  SEEK IMMEDIATE MEDICAL CARE IF:   · You have severe chest pain that goes down your arm or into your   jaw or neck.  · You feel sweaty, dizzy, or lightheaded.  · You become short of breath.  · You vomit blood.  · You have difficulty or pain with swallowing.  · You have bloody or black, tarry stools.  · You have episodes of heartburn more than 3 times a week, for more than 2 weeks.  MAKE SURE YOU:  · Understand these instructions.  · Will watch your condition.  · Will get help right away if you are not doing well or get worse.  Document Released: 06/11/2000 Document Revised: 04/04/2013 Document Reviewed: 01/31/2013  [employer]® Patient Information ©2014 [employer].

## 2013-12-06 ENCOUNTER — Inpatient Hospital Stay (HOSPITAL_COMMUNITY)
Admission: AD | Admit: 2013-12-06 | Discharge: 2013-12-08 | DRG: 775 | Disposition: A | Discharge status: Home / Self Care | Payer: Medicaid Other | Source: Ambulatory Visit | Attending: Obstetrics | Admitting: Obstetrics

## 2013-12-06 ENCOUNTER — Inpatient Hospital Stay (HOSPITAL_COMMUNITY): Payer: Medicaid Other | Admitting: Anesthesiology

## 2013-12-06 ENCOUNTER — Encounter (HOSPITAL_COMMUNITY): Payer: Self-pay | Admitting: *Deleted

## 2013-12-06 ENCOUNTER — Encounter (HOSPITAL_COMMUNITY): Payer: Medicaid Other | Admitting: Anesthesiology

## 2013-12-06 DIAGNOSIS — Z6841 Body Mass Index (BMI) 40.0 and over, adult: Secondary | ICD-10-CM

## 2013-12-06 DIAGNOSIS — D649 Anemia, unspecified: Secondary | ICD-10-CM | POA: Diagnosis present

## 2013-12-06 DIAGNOSIS — IMO0002 Reserved for concepts with insufficient information to code with codable children: Secondary | ICD-10-CM | POA: Diagnosis not present

## 2013-12-06 DIAGNOSIS — Z87891 Personal history of nicotine dependence: Secondary | ICD-10-CM

## 2013-12-06 DIAGNOSIS — Z3400 Encounter for supervision of normal first pregnancy, unspecified trimester: Secondary | ICD-10-CM

## 2013-12-06 DIAGNOSIS — Z833 Family history of diabetes mellitus: Secondary | ICD-10-CM

## 2013-12-06 DIAGNOSIS — O99214 Obesity complicating childbirth: Secondary | ICD-10-CM

## 2013-12-06 DIAGNOSIS — E669 Obesity, unspecified: Secondary | ICD-10-CM | POA: Diagnosis present

## 2013-12-06 DIAGNOSIS — O9902 Anemia complicating childbirth: Secondary | ICD-10-CM | POA: Diagnosis present

## 2013-12-06 LAB — CBC
HCT: 33.8 % — ABNORMAL LOW (ref 36.0–46.0)
HCT: 34.5 % — ABNORMAL LOW (ref 36.0–46.0)
Hemoglobin: 11.3 g/dL — ABNORMAL LOW (ref 12.0–15.0)
Hemoglobin: 11.4 g/dL — ABNORMAL LOW (ref 12.0–15.0)
MCH: 31.4 pg (ref 26.0–34.0)
MCH: 31.7 pg (ref 26.0–34.0)
MCHC: 33.4 g/dL (ref 30.0–36.0)
MCHC: 33 g/dL (ref 30.0–36.0)
MCV: 93.9 fL (ref 78.0–100.0)
MCV: 95.8 fL (ref 78.0–100.0)
PLATELETS: 219 10*3/uL (ref 150–400)
Platelets: 243 10*3/uL (ref 150–400)
RBC: 3.6 MIL/uL — ABNORMAL LOW (ref 3.87–5.11)
RBC: 3.6 MIL/uL — AB (ref 3.87–5.11)
RDW: 13.7 % (ref 11.5–15.5)
RDW: 13.9 % (ref 11.5–15.5)
WBC: 11.5 10*3/uL — ABNORMAL HIGH (ref 4.0–10.5)
WBC: 17.2 10*3/uL — ABNORMAL HIGH (ref 4.0–10.5)

## 2013-12-06 LAB — COMPREHENSIVE METABOLIC PANEL
ALT: 12 U/L (ref 0–35)
AST: 20 U/L (ref 0–37)
Albumin: 2.8 g/dL — ABNORMAL LOW (ref 3.5–5.2)
Alkaline Phosphatase: 143 U/L — ABNORMAL HIGH (ref 39–117)
BUN: 5 mg/dL — ABNORMAL LOW (ref 6–23)
CO2: 23 meq/L (ref 19–32)
CREATININE: 0.69 mg/dL (ref 0.50–1.10)
Calcium: 9.6 mg/dL (ref 8.4–10.5)
Chloride: 100 mEq/L (ref 96–112)
GFR calc non Af Amer: >90 mL/min (ref >90)
GLUCOSE: 90 mg/dL (ref 70–99)
Potassium: 4.1 mEq/L (ref 3.7–5.3)
Sodium: 135 mEq/L — ABNORMAL LOW (ref 137–147)
Total Bilirubin: 1.1 mg/dL (ref 0.3–1.2)
Total Protein: 6.9 g/dL (ref 6.0–8.3)

## 2013-12-06 LAB — URIC ACID: Uric Acid, Serum: 4.5 mg/dL (ref 2.4–7.0)

## 2013-12-06 LAB — LACTATE DEHYDROGENASE: LDH: 192 U/L (ref 94–250)

## 2013-12-06 LAB — RPR

## 2013-12-06 MED ORDER — LACTATED RINGERS IV SOLN: 500.0000 mL | INTRAVENOUS | Status: DC | PRN

## 2013-12-06 MED ORDER — PHENYLEPHRINE 40 MCG/ML (10ML) SYRINGE FOR IV PUSH (FOR BLOOD PRESSURE SUPPORT)
80.0000 ug | PREFILLED_SYRINGE | INTRAVENOUS | Status: DC | PRN
Start: 2013-12-06 — End: 2013-12-06
  Filled 2013-12-06: qty 2

## 2013-12-06 MED ORDER — OXYTOCIN 40 UNITS IN LACTATED RINGERS INFUSION - SIMPLE MED
1.0000 m[IU]/min | INTRAVENOUS | Status: DC
  Administered 2013-12-06: 1 m[IU]/min via INTRAVENOUS
  Filled 2013-12-06: qty 1000

## 2013-12-06 MED ORDER — OXYCODONE-ACETAMINOPHEN 5-325 MG PO TABS: 1.0000 | ORAL_TABLET | ORAL | Status: DC | PRN

## 2013-12-06 MED ORDER — PHENYLEPHRINE 40 MCG/ML (10ML) SYRINGE FOR IV PUSH (FOR BLOOD PRESSURE SUPPORT)
80.0000 ug | PREFILLED_SYRINGE | INTRAVENOUS | Status: DC | PRN
  Filled 2013-12-06: qty 2

## 2013-12-06 MED ORDER — SODIUM BICARBONATE 8.4 % IV SOLN
INTRAVENOUS | Status: DC | PRN
  Administered 2013-12-06: 5 mL via EPIDURAL

## 2013-12-06 MED ORDER — PHENYLEPHRINE 40 MCG/ML (10ML) SYRINGE FOR IV PUSH (FOR BLOOD PRESSURE SUPPORT)
PREFILLED_SYRINGE | INTRAVENOUS | Status: AC
  Filled 2013-12-06: qty 10

## 2013-12-06 MED ORDER — LACTATED RINGERS IV SOLN
500.0000 mL | Freq: Once | INTRAVENOUS | Status: AC
  Administered 2013-12-06: 500 mL via INTRAVENOUS

## 2013-12-06 MED ORDER — OXYTOCIN 40 UNITS IN LACTATED RINGERS INFUSION - SIMPLE MED
62.5000 mL/h | INTRAVENOUS | Status: DC
  Administered 2013-12-06: 62.5 mL/h via INTRAVENOUS

## 2013-12-06 MED ORDER — DIPHENHYDRAMINE HCL 50 MG/ML IJ SOLN
12.5000 mg | INTRAMUSCULAR | Status: DC | PRN
  Administered 2013-12-06: 12.5 mg via INTRAVENOUS
  Filled 2013-12-06: qty 1

## 2013-12-06 MED ORDER — IBUPROFEN 600 MG PO TABS
600.0000 mg | ORAL_TABLET | Freq: Four times a day (QID) | ORAL | Status: DC
  Administered 2013-12-07 – 2013-12-08 (×7): 600 mg via ORAL
  Filled 2013-12-06 (×8): qty 1

## 2013-12-06 MED ORDER — ZOLPIDEM TARTRATE 5 MG PO TABS: 5.0000 mg | ORAL_TABLET | Freq: Every evening | ORAL | Status: DC | PRN

## 2013-12-06 MED ORDER — FLEET ENEMA 7-19 GM/118ML RE ENEM: 1.0000 | ENEMA | RECTAL | Status: DC | PRN

## 2013-12-06 MED ORDER — TETANUS-DIPHTH-ACELL PERTUSSIS 5-2.5-18.5 LF-MCG/0.5 IM SUSP: 0.5000 mL | Freq: Once | INTRAMUSCULAR | Status: DC

## 2013-12-06 MED ORDER — NALBUPHINE HCL 10 MG/ML IJ SOLN
10.0000 mg | Freq: Four times a day (QID) | INTRAMUSCULAR | Status: DC | PRN
  Administered 2013-12-06: 10 mg via INTRAMUSCULAR
  Filled 2013-12-06: qty 1

## 2013-12-06 MED ORDER — EPHEDRINE 5 MG/ML INJ
INTRAVENOUS | Status: AC
  Filled 2013-12-06: qty 4

## 2013-12-06 MED ORDER — SENNOSIDES-DOCUSATE SODIUM 8.6-50 MG PO TABS
2.0000 | ORAL_TABLET | ORAL | Status: DC
  Administered 2013-12-07 – 2013-12-08 (×2): 2 via ORAL
  Filled 2013-12-06 (×2): qty 2

## 2013-12-06 MED ORDER — PROMETHAZINE HCL 25 MG/ML IJ SOLN
25.0000 mg | Freq: Four times a day (QID) | INTRAMUSCULAR | Status: DC | PRN
  Administered 2013-12-06: 25 mg via INTRAMUSCULAR
  Filled 2013-12-06: qty 1

## 2013-12-06 MED ORDER — ONDANSETRON HCL 4 MG/2ML IJ SOLN: 4.0000 mg | INTRAMUSCULAR | Status: DC | PRN

## 2013-12-06 MED ORDER — DIPHENHYDRAMINE HCL 25 MG PO CAPS: 25.0000 mg | ORAL_CAPSULE | Freq: Four times a day (QID) | ORAL | Status: DC | PRN

## 2013-12-06 MED ORDER — EPHEDRINE 5 MG/ML INJ
10.0000 mg | INTRAVENOUS | Status: DC | PRN
  Filled 2013-12-06: qty 2

## 2013-12-06 MED ORDER — LACTATED RINGERS IV SOLN
INTRAVENOUS | Status: DC
Start: 2013-12-06 — End: 2013-12-06
  Administered 2013-12-06: 10:00:00 via INTRAVENOUS
  Administered 2013-12-06: 125 mL/h via INTRAVENOUS
  Administered 2013-12-06: 17:00:00 via INTRAVENOUS

## 2013-12-06 MED ORDER — TERBUTALINE SULFATE 1 MG/ML IJ SOLN: 0.2500 mg | Freq: Once | INTRAMUSCULAR | Status: DC | PRN

## 2013-12-06 MED ORDER — FENTANYL 2.5 MCG/ML BUPIVACAINE 1/10 % EPIDURAL INFUSION (WH - ANES)
14.0000 mL/h | INTRAMUSCULAR | Status: DC | PRN
  Administered 2013-12-06: 14 mL/h via EPIDURAL
  Filled 2013-12-06: qty 125

## 2013-12-06 MED ORDER — WITCH HAZEL-GLYCERIN EX PADS
1.0000 | MEDICATED_PAD | CUTANEOUS | Status: DC | PRN
Start: 2013-12-06 — End: 2013-12-08

## 2013-12-06 MED ORDER — FERROUS SULFATE 325 (65 FE) MG PO TABS
325.0000 mg | ORAL_TABLET | Freq: Two times a day (BID) | ORAL | Status: DC
  Administered 2013-12-07: 325 mg via ORAL
  Filled 2013-12-06 (×3): qty 1

## 2013-12-06 MED ORDER — OXYCODONE-ACETAMINOPHEN 5-325 MG PO TABS
1.0000 | ORAL_TABLET | ORAL | Status: DC | PRN
  Administered 2013-12-07 – 2013-12-08 (×6): 1 via ORAL
  Filled 2013-12-06 (×6): qty 1

## 2013-12-06 MED ORDER — LANOLIN HYDROUS EX OINT: TOPICAL_OINTMENT | CUTANEOUS | Status: DC | PRN

## 2013-12-06 MED ORDER — ONDANSETRON HCL 4 MG/2ML IJ SOLN: 4.0000 mg | Freq: Four times a day (QID) | INTRAMUSCULAR | Status: DC | PRN

## 2013-12-06 MED ORDER — IBUPROFEN 600 MG PO TABS: 600.0000 mg | ORAL_TABLET | Freq: Four times a day (QID) | ORAL | Status: DC | PRN

## 2013-12-06 MED ORDER — FENTANYL 2.5 MCG/ML BUPIVACAINE 1/10 % EPIDURAL INFUSION (WH - ANES)
14.0000 mL/h | INTRAMUSCULAR | Status: DC | PRN
  Administered 2013-12-06: 14 mL/h via EPIDURAL

## 2013-12-06 MED ORDER — LIDOCAINE HCL (PF) 1 % IJ SOLN
30.0000 mL | INTRAMUSCULAR | Status: DC | PRN
  Administered 2013-12-06: 30 mL via SUBCUTANEOUS
  Filled 2013-12-06: qty 30

## 2013-12-06 MED ORDER — OXYTOCIN BOLUS FROM INFUSION: 500.0000 mL | INTRAVENOUS | Status: DC

## 2013-12-06 MED ORDER — ONDANSETRON HCL 4 MG PO TABS: 4.0000 mg | ORAL_TABLET | ORAL | Status: DC | PRN

## 2013-12-06 MED ORDER — PENICILLIN G POTASSIUM 5000000 UNITS IJ SOLR
5.0000 10*6.[IU] | Freq: Once | INTRAVENOUS | Status: AC
  Administered 2013-12-06: 5 10*6.[IU] via INTRAVENOUS
  Filled 2013-12-06: qty 5

## 2013-12-06 MED ORDER — PRENATAL MULTIVITAMIN CH
1.0000 | ORAL_TABLET | Freq: Every day | ORAL | Status: DC
  Administered 2013-12-07 – 2013-12-08 (×2): 1 via ORAL
  Filled 2013-12-06 (×2): qty 1

## 2013-12-06 MED ORDER — PENICILLIN G POTASSIUM 5000000 UNITS IJ SOLR
2.5000 10*6.[IU] | INTRAVENOUS | Status: DC
  Administered 2013-12-06 (×3): 2.5 10*6.[IU] via INTRAVENOUS
  Filled 2013-12-06 (×5): qty 2.5

## 2013-12-06 MED ORDER — NALBUPHINE HCL 10 MG/ML IJ SOLN
10.0000 mg | INTRAMUSCULAR | Status: DC | PRN
  Administered 2013-12-06: 10 mg via INTRAVENOUS
  Filled 2013-12-06: qty 1

## 2013-12-06 MED ORDER — FENTANYL 2.5 MCG/ML BUPIVACAINE 1/10 % EPIDURAL INFUSION (WH - ANES)
INTRAMUSCULAR | Status: AC
  Filled 2013-12-06: qty 125

## 2013-12-06 MED ORDER — ACETAMINOPHEN 325 MG PO TABS: 650.0000 mg | ORAL_TABLET | ORAL | Status: DC | PRN

## 2013-12-06 MED ORDER — DIBUCAINE 1 % RE OINT: 1.0000 "application " | TOPICAL_OINTMENT | RECTAL | Status: DC | PRN

## 2013-12-06 MED ORDER — CITRIC ACID-SODIUM CITRATE 334-500 MG/5ML PO SOLN: 30.0000 mL | ORAL | Status: DC | PRN

## 2013-12-06 MED ORDER — BENZOCAINE-MENTHOL 20-0.5 % EX AERO
1.0000 "application " | INHALATION_SPRAY | CUTANEOUS | Status: DC | PRN
  Administered 2013-12-08: 1 via TOPICAL
  Filled 2013-12-06 (×2): qty 56

## 2013-12-06 MED ORDER — SIMETHICONE 80 MG PO CHEW: 80.0000 mg | CHEWABLE_TABLET | ORAL | Status: DC | PRN

## 2013-12-06 NOTE — H&P (Signed)
Liv Kathie Rhodes Dondlinger is a 23 y.o. female presenting for SROM and UC's. Maternal Medical History:  Reason for admission: Rupture of membranes and contractions.   Fetal activity: Perceived fetal activity is normal.   Last perceived fetal movement was within the past hour.    Prenatal complications: no prenatal complications Prenatal Complications - Diabetes: none.    OB History   Grav Para Term Preterm Abortions TAB SAB Ect Mult Living   3 0 0  2 0 1   0     Past Medical History  Diagnosis Date  . Trichomonas   . Medical history non-contributory    Past Surgical History  Procedure Laterality Date  . Dilation and curettage of uterus     Family History: family history includes Cancer in her brother; Diabetes in her brother; Hypertension in her maternal grandfather. There is no history of Heart disease. Social History:  reports that she quit smoking about 8 months ago. She has never used smokeless tobacco. She reports that she does not drink alcohol or use illicit drugs.   Prenatal Transfer Tool  Maternal Diabetes: No Genetic Screening: Normal Maternal Ultrasounds/Referrals: Normal Fetal Ultrasounds or other Referrals:  None Maternal Substance Abuse:  No Significant Maternal Medications:  None Significant Maternal Lab Results:  None Other Comments:  None  Review of Systems  All other systems reviewed and are negative.   Dilation: 4 Effacement (%): 90 Station: -1 Exam by:: [facility] Blood pressure 114/76, pulse 86, temperature 98.1 F (36.7 C), temperature source Oral, resp. rate 20, height 5\' 1"  (1.549 m), weight 214 lb (97.07 kg), last menstrual period 03/06/2013, SpO2 99.00%. Maternal Exam:  Abdomen: Patient reports no abdominal tenderness. Fetal presentation: vertex  Cervix: Cervix evaluated by digital exam.     Physical Exam  Nursing note and vitals reviewed. Constitutional: She is oriented to person, place, and time. She appears well-developed and well-nourished.   HENT:  Head: Normocephalic and atraumatic.  Eyes: Conjunctivae are normal. Pupils are equal, round, and reactive to light.  Neck: Normal range of motion. Neck supple.  Cardiovascular: Normal rate and regular rhythm.   Respiratory: Effort normal.  GI: Soft.  Genitourinary: Vagina normal and uterus normal.  Musculoskeletal: Normal range of motion.  Neurological: She is alert and oriented to person, place, and time.    Prenatal labs: ABO, Rh: A/POS/-- (11/06 1716) Antibody: NEG (11/06 1716) Rubella: 8.10 (11/06 1716) RPR: NON REAC (03/09 1152)  HBsAg: NEGATIVE (11/06 1716)  HIV: NON REACTIVE (03/09 1152)  GBS: Detected (05/18 1543)   Assessment/Plan: 39 weeks.  SROM.  Early labor.  Admit.   HARPER,CHARLES A 12/06/2013, 1:41 PM

## 2013-12-06 NOTE — Anesthesia Preprocedure Evaluation (Signed)

## 2013-12-06 NOTE — Anesthesia Procedure Notes (Signed)

## 2013-12-06 NOTE — Progress Notes (Signed)
Nikaela Kathie Rhodes Halgren is a 23 y.o. G3P0020 at [redacted]w[redacted]d by LMP admitted for rupture of membranes  Subjective:   Objective: BP 144/93  Pulse 90  Temp(Src) 97.8 F (36.6 C) (Oral)  Resp 20  Ht 5\' 1"  (1.549 m)  Wt 214 lb (97.07 kg)  BMI 40.46 kg/m2  SpO2 99%  LMP 03/06/2013      FHT:  FHR: 140 bpm, variability: moderate,  accelerations:  Present,  decelerations:  Present variable UC:   regular, every 3-5 minutes SVE:   Dilation: 5.5 Effacement (%): 90 Station: -2;-1 Exam by:: [facility]  Labs: Lab Results  Component Value Date   WBC 11.5* 12/06/2013   HGB 11.3* 12/06/2013   HCT 33.8* 12/06/2013   MCV 93.9 12/06/2013   PLT 243 12/06/2013    Assessment / Plan: Augmentation of labor, progressing well  Labor: Progressing normally Preeclampsia:  n/a Fetal Wellbeing:  Category I Pain Control:  Epidural I/D:  n/a Anticipated MOD:  NSVD  HARPER,CHARLES A 12/06/2013, 4:23 PM

## 2013-12-06 NOTE — Progress Notes (Signed)
Delivery of live viable female by Dr. Gaynell Face at 2027.

## 2013-12-06 NOTE — MAU Note (Signed)
States gush of fluid at 0500-clear fluid. Denies bleeding. Contractions every 5-7 mins. +FM.

## 2013-12-07 DIAGNOSIS — IMO0002 Reserved for concepts with insufficient information to code with codable children: Secondary | ICD-10-CM | POA: Diagnosis not present

## 2013-12-07 LAB — CBC
HEMATOCRIT: 27.2 % — AB (ref 36.0–46.0)
Hemoglobin: 9 g/dL — ABNORMAL LOW (ref 12.0–15.0)
MCH: 31.5 pg (ref 26.0–34.0)
MCHC: 33.1 g/dL (ref 30.0–36.0)
MCV: 95.1 fL (ref 78.0–100.0)
Platelets: 210 10*3/uL (ref 150–400)
RBC: 2.86 MIL/uL — ABNORMAL LOW (ref 3.87–5.11)
RDW: 13.7 % (ref 11.5–15.5)
WBC: 18.5 10*3/uL — AB (ref 4.0–10.5)

## 2013-12-07 LAB — CCBB MATERNAL DONOR DRAW

## 2013-12-07 NOTE — Progress Notes (Signed)
Patient ID: [name], female   DOB: [birth year], 23 y.o.   MRN: [id number] Post Partum Day 1 S/P spontaneous vaginal RH status/Rubella reviewed.  Feeding: breast Subjective: No HA, SOB, CP, F/C, breast symptoms. Normal vaginal bleeding, no clots.     Objective: BP 127/84  Pulse 97  Temp(Src) 98.4 F (36.9 C) (Oral)  Resp 18  Ht 5\' 1"  (1.549 m)  Wt 97.07 kg (214 lb)  BMI 40.46 kg/m2  SpO2 100%  LMP 03/06/2013  Breastfeeding? Unknown I  Physical Exam:  General: alert Lochia: appropriate Uterine Fundus: firm DVT Evaluation: No evidence of DVT seen on physical exam. Ext: No c/c/e  Recent Labs  12/06/13 1955 12/07/13 0545  HGB 11.4* 9.0*  HCT 34.5* 27.2*      Assessment/Plan: 23 y.o.  PPD #1 .  normal postpartum exam Anemia stable Continue current postpartum care Ambulate   LOS: 1 day   JACKSON-MOORE,Tyric Rodeheaver A 12/07/2013, 8:14 AM

## 2013-12-07 NOTE — Anesthesia Postprocedure Evaluation (Signed)
  Anesthesia Post-op Note  Patient: [name]  Procedure(s) Performed: * No procedures listed *  Patient Location: PACU and Mother/Baby  Anesthesia Type:Epidural  Level of Consciousness: awake, alert  and oriented  Airway and Oxygen Therapy: Patient Spontanous Breathing  Post-op Pain: mild  Post-op Assessment: Patient's Cardiovascular Status Stable, Respiratory Function Stable, No signs of Nausea or vomiting, Adequate PO intake, Pain level controlled, No headache, No backache, No residual numbness and No residual motor weakness  Post-op Vital Signs: Reviewed and stable  Last Vitals:  Filed Vitals:   12/07/13 0610  BP: 127/84  Pulse: 97  Temp: 36.9 C  Resp: 18    Complications: No apparent anesthesia complications

## 2013-12-08 LAB — TYPE AND SCREEN
ABO/RH(D): A POS
Antibody Screen: POSITIVE
DAT, IgG: NEGATIVE
DONOR AG TYPE: NEGATIVE
Donor AG Type: NEGATIVE
PT AG Type: NEGATIVE
Unit division: 0
Unit division: 0

## 2013-12-08 MED ORDER — OXYCODONE-ACETAMINOPHEN 5-325 MG PO TABS: 1.0000 | ORAL_TABLET | ORAL | Status: DC | PRN

## 2013-12-08 NOTE — Discharge Instructions (Signed)

## 2013-12-08 NOTE — Lactation Note (Signed)
[name] Lactation Consultation Note Mom didn't have any bruising or trauma to nipples at this time and demonstrated proper latch. Patient Name: [name] AOZHY'QToday's Date: 12/08/2013 Reason for consult: Follow-up assessment   Maternal Data Has patient been taught Hand Expression?: Yes Does the patient have breastfeeding experience prior to this delivery?: No  Feeding Feeding Type: Breast Fed  LATCH Score/Interventions Latch: Grasps breast easily, tongue down, lips flanged, rhythmical sucking. Intervention(s): Adjust position;Breast massage;Breast compression  Audible Swallowing: Spontaneous and intermittent Intervention(s): Skin to skin;Hand expression;Alternate breast massage  Type of Nipple: Everted at rest and after stimulation  Comfort (Breast/Nipple): Soft / non-tender     Hold (Positioning): Assistance needed to correctly position infant at breast and maintain latch. Intervention(s): Skin to skin;Position options;Support Pillows;Breastfeeding basics reviewed  LATCH Score: 9  Lactation Tools Discussed/Used     Consult Status Consult Status: Complete Date: 12/08/13 Follow-up type: In-patient    [name], [name] 12/08/2013, 10:00 AM

## 2013-12-08 NOTE — Lactation Note (Signed)
This note was [name]. Lactation Consultation Note Mom planning on d/c today, states BF going well. Encouraged to elevate pendulum breast w/wash cloth so it wouldn't pull down on the baby during feeding. Encouraged comfort and support during feeding. Hand expression went over and encouragement. Baby has bad new born rash to entire body, encouraged after BF to rub colostrum to baby's rash and not to put baby lotion on baby d/t possible irritation and report to DR. To look at rash prior to d/c home. Encouraged to cont. Feeding log to take to Dr. Alfonzo BeersAppt. Mom has good colostrum and states has comfort during feeding. Encouraged breast massage during feeding at intervals. Reminded out Breast feeding support groups and Consult # if has any questions or concerns. Patient Name: [name] ZOXWR'UToday's Date: 12/08/2013 Reason for consult: Follow-up assessment   Maternal Data Has patient been taught Hand Expression?: Yes Does the patient have breastfeeding experience prior to this delivery?: No  Feeding Feeding Type: Breast Fed  LATCH Score/Interventions Latch: Grasps breast easily, tongue down, lips flanged, rhythmical sucking. Intervention(s): Adjust position;Breast massage;Breast compression  Audible Swallowing: Spontaneous and intermittent Intervention(s): Skin to skin;Hand expression;Alternate breast massage  Type of Nipple: Everted at rest and after stimulation  Comfort (Breast/Nipple): Soft / non-tender     Hold (Positioning): Assistance needed to correctly position infant at breast and maintain latch. Intervention(s): Skin to skin;Position options;Support Pillows;Breastfeeding basics reviewed  LATCH Score: 9  Lactation Tools Discussed/Used     Consult Status Consult Status: Complete Date: 12/08/13 Follow-up type: In-patient    Charyl DancerCARVER, Fifi Schindler G 12/08/2013, 9:54 AM

## 2013-12-08 NOTE — Discharge Summary (Signed)
  Obstetric Discharge Summary Reason for Admission: onset of labor Prenatal Procedures: none Intrapartum Procedures: spontaneous vaginal delivery Postpartum Procedures: none Complications-Operative and Postpartum: none  Hemoglobin  Date Value Ref Range Status  12/07/2013 9.0* 12.0 - 15.0 g/dL Final     DELTA CHECK NOTED     REPEATED TO VERIFY     HCT  Date Value Ref Range Status  12/07/2013 27.2* 36.0 - 46.0 % Final    Physical Exam:  General: alert Lochia: appropriate Uterine: firm Incision: n/a DVT Evaluation: No evidence of DVT seen on physical exam.  Discharge Diagnoses: Active Problems:   Indication for care in labor or delivery   NVD (normal vaginal delivery)   Maternal anemia complicating pregnancy, childbirth, or the puerperium   Discharge Information: Date: 12/08/2013 Activity: pelvic rest Diet: routine Medications:  Prior to Admission medications   Medication Sig Start Date End Date Taking? Authorizing Provider  acetaminophen (TYLENOL) 500 MG tablet Take 500 mg by mouth every 6 (six) hours as needed for headache.   Yes Historical Provider, MD  Prenatal Vit-Fe Fumarate-FA (PRENATAL MULTIVITAMIN) TABS tablet Take 1 tablet by mouth daily at 12 noon.   Yes Historical Provider, MD  oxyCODONE-acetaminophen (PERCOCET/ROXICET) 5-325 MG per tablet Take 1-2 tablets by mouth every 4 (four) hours as needed for severe pain (moderate - severe pain). 12/08/13   Antionette CharLisa Jackson-Moore, MD    Condition: stable Instructions: refer to routine discharge instructions Discharge to: home Follow-up Information   Follow up with Roseanna RainbowJACKSON-MOORE,Merrissa Giacobbe A, MD. Schedule an appointment as soon as possible for a visit in 3 weeks. (postpartum visit)    Specialty:  Obstetrics and Gynecology   Contact information:   [address] [place] [id number] [phone]       Newborn Data:  Live born female  Birth Weight: 7 lb 0.2 oz (3181 g) APGAR: 9, 9   Home with  mother.  JACKSON-MOORE,Lyndell Gillyard A 12/08/2013, 11:50 AM

## 2013-12-10 ENCOUNTER — Encounter: Payer: Medicaid Other | Admitting: Obstetrics & Gynecology

## 2013-12-10 NOTE — Progress Notes (Signed)
Ur chart review completed post discharge.  

## 2013-12-20 ENCOUNTER — Ambulatory Visit: Payer: Medicaid Other | Admitting: Obstetrics & Gynecology

## 2013-12-27 ENCOUNTER — Ambulatory Visit (INDEPENDENT_AMBULATORY_CARE_PROVIDER_SITE_OTHER): Payer: Medicaid Other | Admitting: Obstetrics & Gynecology

## 2013-12-27 ENCOUNTER — Encounter: Payer: Self-pay | Admitting: Obstetrics & Gynecology

## 2013-12-27 NOTE — Progress Notes (Signed)
Subjective:     [name] is a 23 y.o. female who presents for a postpartum visit. She is 3 weeks postpartum following a spontaneous vaginal delivery. I have fully reviewed the prenatal and intrapartum course.  Outcome: spontaneous vaginal delivery.  Postpartum course has been uneventful. Baby's course has been unremarkable. Bleeding no bleeding. Bowel function is normal. Bladder function is normal. Patient is not sexually active. Postpartum depression screening: negative.  The following portions of the patient's history were reviewed and updated as appropriate: allergies, current medications, past family history, past medical history, past social history, past surgical history and problem list.  Review of Systems Pertinent items are noted in HPI.   Objective:    BP 133/86  Pulse 90  Temp(Src) 98.8 F (37.1 C)  Ht 5\' 1"  (1.549 m)  Wt 86.637 kg (191 lb)  BMI 36.11 kg/m2  Breastfeeding? Yes          50% of 15 min visit spent on counseling and coordination of care.  Assessment:     Normal postpartum exam.   Plan:   .Follow up in: 3 weeks or as needed.

## 2013-12-30 NOTE — Patient Instructions (Signed)
Contraception Choices Contraception (birth control) is the use of any methods or devices to prevent pregnancy. Below are some methods to help avoid pregnancy. HORMONAL METHODS   Contraceptive implant. This is a thin, plastic tube containing progesterone hormone. It does not contain estrogen hormone. Your health care provider inserts the tube in the inner part of the upper arm. The tube can remain in place for up to 3 years. After 3 years, the implant must be removed. The implant prevents the ovaries from releasing an egg (ovulation), thickens the cervical mucus to prevent sperm from entering the uterus, and thins the lining of the inside of the uterus.  Progesterone-only injections. These injections are given every 3 months by your health care provider to prevent pregnancy. This synthetic progesterone hormone stops the ovaries from releasing eggs. It also thickens cervical mucus and changes the uterine lining. This makes it harder for sperm to survive in the uterus.  Birth control pills. These pills contain estrogen and progesterone hormone. They work by preventing the ovaries from releasing eggs (ovulation). They also cause the cervical mucus to thicken, preventing the sperm from entering the uterus. Birth control pills are prescribed by a health care provider.Birth control pills can also be used to treat heavy periods.  Minipill. This type of birth control pill contains only the progesterone hormone. They are taken every day of each month and must be prescribed by your health care provider.  Birth control patch. The patch contains hormones similar to those in birth control pills. It must be changed once a week and is prescribed by a health care provider.  Vaginal ring. The ring contains hormones similar to those in birth control pills. It is left in the vagina for 3 weeks, removed for 1 week, and then a new one is put back in place. The patient must be comfortable inserting and removing the ring  from the vagina.A health care provider's prescription is necessary.  Emergency contraception. Emergency contraceptives prevent pregnancy after unprotected sexual intercourse. This pill can be taken right after sex or up to 5 days after unprotected sex. It is most effective the sooner you take the pills after having sexual intercourse. Most emergency contraceptive pills are available without a prescription. Check with your pharmacist. Do not use emergency contraception as your only form of birth control. BARRIER METHODS   Female condom. This is a thin sheath (latex or rubber) that is worn over the penis during sexual intercourse. It can be used with spermicide to increase effectiveness.  Female condom. This is a soft, loose-fitting sheath that is put into the vagina before sexual intercourse.  Diaphragm. This is a soft, latex, dome-shaped barrier that must be fitted by a health care provider. It is inserted into the vagina, along with a spermicidal jelly. It is inserted before intercourse. The diaphragm should be left in the vagina for 6 to 8 hours after intercourse.  Cervical cap. This is a round, soft, latex or plastic cup that fits over the cervix and must be fitted by a health care provider. The cap can be left in place for up to 48 hours after intercourse.  Sponge. This is a soft, circular piece of polyurethane foam. The sponge has spermicide in it. It is inserted into the vagina after wetting it and before sexual intercourse.  Spermicides. These are chemicals that kill or block sperm from entering the cervix and uterus. They come in the form of creams, jellies, suppositories, foam, or tablets. They do not require a   prescription. They are inserted into the vagina with an applicator before having sexual intercourse. The process must be repeated every time you have sexual intercourse. INTRAUTERINE CONTRACEPTION  Intrauterine device (IUD). This is a T-shaped device that is put in a woman's uterus  during a menstrual period to prevent pregnancy. There are 2 types:  Copper IUD. This type of IUD is wrapped in copper wire and is placed inside the uterus. Copper makes the uterus and fallopian tubes produce a fluid that kills sperm. It can stay in place for 10 years.  Hormone IUD. This type of IUD contains the hormone progestin (synthetic progesterone). The hormone thickens the cervical mucus and prevents sperm from entering the uterus, and it also thins the uterine lining to prevent implantation of a fertilized egg. The hormone can weaken or kill the sperm that get into the uterus. It can stay in place for 3-5 years, depending on which type of IUD is used. PERMANENT METHODS OF CONTRACEPTION  Female tubal ligation. This is when the woman's fallopian tubes are surgically sealed, tied, or blocked to prevent the egg from traveling to the uterus.  Hysteroscopic sterilization. This involves placing a small coil or insert into each fallopian tube. Your doctor uses a technique called hysteroscopy to do the procedure. The device causes scar tissue to form. This results in permanent blockage of the fallopian tubes, so the sperm cannot fertilize the egg. It takes about 3 months after the procedure for the tubes to become blocked. You must use another form of birth control for these 3 months.  Female sterilization. This is when the female has the tubes that carry sperm tied off (vasectomy).This blocks sperm from entering the vagina during sexual intercourse. After the procedure, the man can still ejaculate fluid (semen). NATURAL PLANNING METHODS  Natural family planning. This is not having sexual intercourse or using a barrier method (condom, diaphragm, cervical cap) on days the woman could become pregnant.  Calendar method. This is keeping track of the length of each menstrual cycle and identifying when you are fertile.  Ovulation method. This is avoiding sexual intercourse during ovulation.  Symptothermal  method. This is avoiding sexual intercourse during ovulation, using a thermometer and ovulation symptoms.  Post-ovulation method. This is timing sexual intercourse after you have ovulated. Regardless of which type or method of contraception you choose, it is important that you use condoms to protect against the transmission of sexually transmitted infections (STIs). Talk with your health care provider about which form of contraception is most appropriate for you. Document Released: 06/14/2005 Document Revised: 06/19/2013 Document Reviewed: 12/07/2012 Exit[employer] Patient Information 2015 [employer]. This information is not intended to replace advice given to you by your health care provider. Make sure you discuss any questions you have with your health care provider.  

## 2014-01-21 ENCOUNTER — Encounter: Payer: Self-pay | Admitting: *Deleted

## 2014-01-28 ENCOUNTER — Ambulatory Visit (INDEPENDENT_AMBULATORY_CARE_PROVIDER_SITE_OTHER): Payer: Medicaid Other | Admitting: Obstetrics

## 2014-01-28 ENCOUNTER — Encounter: Payer: Self-pay | Admitting: Obstetrics

## 2014-01-28 ENCOUNTER — Encounter: Payer: Self-pay | Admitting: *Deleted

## 2014-01-28 ENCOUNTER — Ambulatory Visit: Payer: Medicaid Other | Admitting: Obstetrics & Gynecology

## 2014-01-28 VITALS — Temp 98.0°F | Ht 61.0 in | Wt 190.0 lb

## 2014-01-28 DIAGNOSIS — Z30017 Encounter for initial prescription of implantable subdermal contraceptive: Secondary | ICD-10-CM

## 2014-01-29 ENCOUNTER — Encounter: Payer: Self-pay | Admitting: Obstetrics

## 2014-01-29 NOTE — Progress Notes (Signed)
Nexplanon Procedure Note   PRE-OP DIAGNOSIS: desired long-term, reversible contraception  POST-OP DIAGNOSIS: Same  PROCEDURE: Nexplanon  placement Performing Provider: Dory HornAmy Wren CNM   Patient education prior to procedure, explained risk, benefits of Nexplanon, reviewed alternative options. Patient reported understanding. Gave consent to continue with procedure.   PROCEDURE:  Pregnancy Text :  Negative Site (check):      left arm         Sterile Preparation:   [id number] Lot # [id number] Expiration Date 11 / 2017  Insertion site was selected 8 - 10 cm from medial epicondyle and marked along with guiding site using sterile marker. Procedure area was prepped and draped in a sterile fashion. 1% Lidocaine 1.5 ml given prior to procedure. Nexplanon  was inserted subcutaneously.Needle was removed from the insertion site. Nexplanon capsule was palpated by provider and patient to assure satisfactory placement. Dressing applied.  Followup: The patient tolerated the procedure well without complications.  Standard post-procedure care is explained and return precautions are given.  Amy Wilson SingerWren CNM

## 2014-02-14 ENCOUNTER — Ambulatory Visit: Payer: Medicaid Other | Admitting: Obstetrics

## 2014-03-06 ENCOUNTER — Ambulatory Visit: Payer: Medicaid Other | Admitting: Obstetrics

## 2014-03-08 ENCOUNTER — Ambulatory Visit: Payer: Medicaid Other | Admitting: Obstetrics

## 2014-04-12 ENCOUNTER — Other Ambulatory Visit: Payer: Self-pay

## 2014-04-29 ENCOUNTER — Encounter: Payer: Self-pay | Admitting: Obstetrics

## 2014-06-24 ENCOUNTER — Encounter: Payer: Self-pay | Admitting: *Deleted

## 2014-06-25 ENCOUNTER — Encounter: Payer: Self-pay | Admitting: Obstetrics & Gynecology

## 2014-12-17 ENCOUNTER — Telehealth: Payer: Self-pay | Admitting: *Deleted

## 2014-12-17 NOTE — Telephone Encounter (Signed)
Patient states she may want to have her Nexplanon removed. Patient state she has experienced some chest pain and migraines and is wondering if it may be related to the Nexplanon. Patient advised to follow up with PCP first to be evaluated and if she still wants to have the device removed then to call back and we will schedule the appointment.

## 2015-03-06 ENCOUNTER — Emergency Department (HOSPITAL_COMMUNITY): Payer: Medicaid Other

## 2015-03-06 ENCOUNTER — Encounter (HOSPITAL_COMMUNITY): Payer: Self-pay | Admitting: *Deleted

## 2015-03-06 ENCOUNTER — Emergency Department (HOSPITAL_COMMUNITY)
Admission: EM | Admit: 2015-03-06 | Discharge: 2015-03-06 | Disposition: A | Discharge status: Home / Self Care | Payer: Medicaid Other | Attending: Emergency Medicine | Admitting: Emergency Medicine

## 2015-03-06 DIAGNOSIS — S299XXA Unspecified injury of thorax, initial encounter: Secondary | ICD-10-CM | POA: Diagnosis present

## 2015-03-06 DIAGNOSIS — Z87891 Personal history of nicotine dependence: Secondary | ICD-10-CM | POA: Insufficient documentation

## 2015-03-06 DIAGNOSIS — Z8619 Personal history of other infectious and parasitic diseases: Secondary | ICD-10-CM | POA: Diagnosis not present

## 2015-03-06 DIAGNOSIS — Y998 Other external cause status: Secondary | ICD-10-CM | POA: Diagnosis not present

## 2015-03-06 DIAGNOSIS — Y92410 Unspecified street and highway as the place of occurrence of the external cause: Secondary | ICD-10-CM | POA: Insufficient documentation

## 2015-03-06 DIAGNOSIS — Y9389 Activity, other specified: Secondary | ICD-10-CM | POA: Insufficient documentation

## 2015-03-06 DIAGNOSIS — Z91040 Latex allergy status: Secondary | ICD-10-CM | POA: Diagnosis not present

## 2015-03-06 DIAGNOSIS — S20211A Contusion of right front wall of thorax, initial encounter: Secondary | ICD-10-CM | POA: Insufficient documentation

## 2015-03-06 MED ORDER — CYCLOBENZAPRINE HCL 5 MG PO TABS: 5.0000 mg | ORAL_TABLET | Freq: Two times a day (BID) | ORAL | Status: DC | PRN

## 2015-03-06 MED ORDER — LORAZEPAM 0.5 MG PO TABS
0.5000 mg | ORAL_TABLET | Freq: Once | ORAL | Status: AC
  Administered 2015-03-06: 0.5 mg via ORAL
  Filled 2015-03-06: qty 1

## 2015-03-06 MED ORDER — ACETAMINOPHEN 325 MG PO TABS
650.0000 mg | ORAL_TABLET | Freq: Once | ORAL | Status: AC
  Administered 2015-03-06: 650 mg via ORAL
  Filled 2015-03-06: qty 2

## 2015-03-06 MED ORDER — IBUPROFEN 800 MG PO TABS: 800.0000 mg | ORAL_TABLET | Freq: Three times a day (TID) | ORAL | Status: DC
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
## 2015-03-06 NOTE — ED Notes (Signed)
Pt A&OX4, ambulatory at d/c with steady gait, NAD 
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
## 2015-03-06 NOTE — ED Notes (Signed)
Patient is with child in Pediatrics being seen
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
## 2015-09-10 ENCOUNTER — Telehealth: Payer: Self-pay | Admitting: *Deleted

## 2015-09-10 NOTE — Telephone Encounter (Signed)
Patient wants to discuss her Nexplanon inserted in 2015 5:52 LM on VM to CB

## 2015-09-11 NOTE — Telephone Encounter (Signed)
11:04 patient would like to schedule removal of her Nexplanon. 2:03 LM on VM to CB to schedule.

## 2015-09-22 ENCOUNTER — Encounter: Payer: Self-pay | Admitting: Obstetrics

## 2015-09-22 ENCOUNTER — Ambulatory Visit (INDEPENDENT_AMBULATORY_CARE_PROVIDER_SITE_OTHER): Payer: Medicaid Other | Admitting: Obstetrics

## 2015-09-22 VITALS — BP 120/81 | HR 81 | Temp 98.3°F | Wt 181.0 lb

## 2015-09-22 DIAGNOSIS — Z3046 Encounter for surveillance of implantable subdermal contraceptive: Secondary | ICD-10-CM

## 2015-09-22 DIAGNOSIS — Z3049 Encounter for surveillance of other contraceptives: Secondary | ICD-10-CM

## 2015-09-22 DIAGNOSIS — Z3009 Encounter for other general counseling and advice on contraception: Secondary | ICD-10-CM

## 2015-09-22 DIAGNOSIS — Z30011 Encounter for initial prescription of contraceptive pills: Secondary | ICD-10-CM

## 2015-09-23 NOTE — Telephone Encounter (Signed)
Patient had removal yesterday.

## 2015-09-25 ENCOUNTER — Encounter: Payer: Self-pay | Admitting: Obstetrics

## 2015-09-25 NOTE — Progress Notes (Signed)

## 2015-10-06 ENCOUNTER — Ambulatory Visit: Payer: Medicaid Other | Admitting: Obstetrics

## 2015-12-24 ENCOUNTER — Inpatient Hospital Stay (HOSPITAL_COMMUNITY)
Admission: AD | Admit: 2015-12-24 | Discharge: 2015-12-24 | Disposition: A | Discharge status: Home / Self Care | Payer: Medicaid Other | Source: Ambulatory Visit | Attending: Obstetrics and Gynecology | Admitting: Obstetrics and Gynecology

## 2015-12-24 ENCOUNTER — Encounter (HOSPITAL_COMMUNITY): Payer: Self-pay | Admitting: *Deleted

## 2015-12-24 DIAGNOSIS — M545 Low back pain: Secondary | ICD-10-CM | POA: Diagnosis present

## 2015-12-24 DIAGNOSIS — M5441 Lumbago with sciatica, right side: Secondary | ICD-10-CM | POA: Diagnosis not present

## 2015-12-24 DIAGNOSIS — O3680X0 Pregnancy with inconclusive fetal viability, not applicable or unspecified: Secondary | ICD-10-CM

## 2015-12-24 DIAGNOSIS — O23591 Infection of other part of genital tract in pregnancy, first trimester: Secondary | ICD-10-CM

## 2015-12-24 DIAGNOSIS — Z87891 Personal history of nicotine dependence: Secondary | ICD-10-CM | POA: Diagnosis not present

## 2015-12-24 DIAGNOSIS — O9989 Other specified diseases and conditions complicating pregnancy, childbirth and the puerperium: Secondary | ICD-10-CM

## 2015-12-24 DIAGNOSIS — N760 Acute vaginitis: Secondary | ICD-10-CM | POA: Insufficient documentation

## 2015-12-24 DIAGNOSIS — M5431 Sciatica, right side: Secondary | ICD-10-CM

## 2015-12-24 DIAGNOSIS — R109 Unspecified abdominal pain: Secondary | ICD-10-CM | POA: Diagnosis present

## 2015-12-24 DIAGNOSIS — B9689 Other specified bacterial agents as the cause of diseases classified elsewhere: Secondary | ICD-10-CM

## 2015-12-24 DIAGNOSIS — Z331 Pregnant state, incidental: Secondary | ICD-10-CM | POA: Insufficient documentation

## 2015-12-24 LAB — URINALYSIS, ROUTINE W REFLEX MICROSCOPIC
BILIRUBIN URINE: NEGATIVE
Glucose, UA: NEGATIVE mg/dL
KETONES UR: NEGATIVE mg/dL
Leukocytes, UA: NEGATIVE
NITRITE: NEGATIVE
PH: 8 (ref 5.0–8.0)
Protein, ur: NEGATIVE mg/dL
SPECIFIC GRAVITY, URINE: 1.01 (ref 1.005–1.030)

## 2015-12-24 LAB — WET PREP, GENITAL
SPERM: NONE SEEN
Trich, Wet Prep: NONE SEEN
YEAST WET PREP: NONE SEEN

## 2015-12-24 LAB — URINE MICROSCOPIC-ADD ON

## 2015-12-24 LAB — POCT PREGNANCY, URINE: Preg Test, Ur: POSITIVE — AB

## 2015-12-24 LAB — HCG, QUANTITATIVE, PREGNANCY: hCG, Beta Chain, Quant, S: 33 m[IU]/mL — ABNORMAL HIGH (ref <5)

## 2015-12-24 MED ORDER — METRONIDAZOLE 500 MG PO TABS: 500.0000 mg | ORAL_TABLET | Freq: Two times a day (BID) | ORAL | Status: DC

## 2015-12-24 NOTE — MAU Note (Addendum)
Sharp pain in R lower back (points to upper buttock) and lower R side, mostly constant. Started 2 weeks ago. Not relieved by Tylenol. States no pain in abdomen right now, only in back. Bedrest and fluids seem to help. + HPT this AM X 2. Denies bleeding or discharge. Had implant birth control removed about 2 months ago. Has not had a period.

## 2015-12-24 NOTE — MAU Provider Note (Signed)
History     CSN: [id number]  Arrival date and time: 12/24/15 1424   First Provider Initiated Contact with Patient 12/24/15 1600      Chief Complaint  Patient presents with  . Abdominal Pain   HPI Comments: [id number] with +HPT c/o Rt low back pain. She describes the pain as sharp and radiating into right buttock and thigh x2 wks. Pain worsens with standing. She took Tylenol with little improvement. No recent injury or fall. She has not had menses since removal if Nexplanon about 2 mos ago. No VB. No abd or pelvic pain.    OB History    Gravida Para Term Preterm AB TAB SAB Ectopic Multiple Living   4 1 1  2  0 1   1      Past Medical History  Diagnosis Date  . Trichomonas   . Medical history non-contributory     Past Surgical History  Procedure Laterality Date  . Dilation and curettage of uterus      Family History  Problem Relation Age of Onset  . Cancer Brother   . Hypertension Maternal Grandfather   . Diabetes Brother   . Heart disease Neg Hx     Social History  Substance Use Topics  . Smoking status: Former Smoker    Quit date: 04/02/2013  . Smokeless tobacco: Never Used  . Alcohol Use: No     Comment: occasionally     Allergies:  Allergies  Allergen Reactions  . Latex Rash    In vaginal area  . Sulfa Antibiotics Hives and Rash    Prescriptions prior to admission  Medication Sig Dispense Refill Last Dose  . acetaminophen (TYLENOL) 500 MG tablet Take 1,000 mg by mouth every 6 (six) hours as needed for mild pain or headache.   Past Week at Unknown time    Review of Systems  Constitutional: Negative.   HENT: Negative.   Eyes: Negative.   Respiratory: Negative.   Cardiovascular: Negative.   Gastrointestinal: Negative.   Genitourinary: Negative.   Musculoskeletal: Positive for back pain.  Skin: Negative.   Neurological: Negative.   Endo/Heme/Allergies: Negative.   Psychiatric/Behavioral: Negative.    Physical Exam   Blood pressure 129/72,  pulse 99, temperature 98 F (36.7 C), temperature source Oral, resp. rate 18.  Physical Exam  Constitutional: She is oriented to person, place, and time. She appears well-developed and well-nourished.  HENT:  Head: Normocephalic and atraumatic.  Neck: Normal range of motion. Neck supple.  Cardiovascular: Normal rate and regular rhythm.   Respiratory: Effort normal and breath sounds normal.  GI: Soft. Bowel sounds are normal. She exhibits no distension. There is no tenderness. There is no rebound and no CVA tenderness.  Genitourinary:  External: No lesions Vagina: rugated, parous, cervix closed, thin white discharge Uterus: non-enlarged, anteverted, non- tender Adnexae: No masses, no tenderness left, no tenderness right   Musculoskeletal: Normal range of motion.  Neurological: She is alert and oriented to person, place, and time.  Skin: Skin is warm and dry.  Psychiatric: She has a normal mood and affect.    MAU Course  Procedures Results for orders placed or performed during the hospital encounter of 12/24/15 (from the past 24 hour(s))  Urinalysis, Routine w reflex microscopic ([facility])     Status: Abnormal   Collection Time: 12/24/15  3:05 PM  Result Value Ref Range   Color, Urine YELLOW YELLOW   APPearance CLEAR CLEAR   Specific Gravity, Urine 1.010 1.005 - 1.030  pH 8.0 5.0 - 8.0   Glucose, UA NEGATIVE NEGATIVE mg/dL   Hgb urine dipstick TRACE (A) NEGATIVE   Bilirubin Urine NEGATIVE NEGATIVE   Ketones, ur NEGATIVE NEGATIVE mg/dL   Protein, ur NEGATIVE NEGATIVE mg/dL   Nitrite NEGATIVE NEGATIVE   Leukocytes, UA NEGATIVE NEGATIVE  Urine microscopic-add on     Status: Abnormal   Collection Time: 12/24/15  3:05 PM  Result Value Ref Range   Squamous Epithelial / LPF 0-5 (A) NONE SEEN   WBC, UA 0-5 0 - 5 WBC/hpf   RBC / HPF 0-5 0 - 5 RBC/hpf   Bacteria, UA FEW (A) NONE SEEN  Pregnancy, urine POC     Status: Abnormal   Collection Time: 12/24/15  3:13 PM  Result Value  Ref Range   Preg Test, Ur POSITIVE (A) NEGATIVE  hCG, quantitative, pregnancy     Status: Abnormal   Collection Time: 12/24/15  3:45 PM  Result Value Ref Range   hCG, Beta Chain, Quant, S 33 (H) <5 mIU/mL  Wet prep, genital     Status: Abnormal   Collection Time: 12/24/15  4:04 PM  Result Value Ref Range   Yeast Wet Prep HPF POC NONE SEEN NONE SEEN   Trich, Wet Prep NONE SEEN NONE SEEN   Clue Cells Wet Prep HPF POC PRESENT (A) NONE SEEN   WBC, Wet Prep HPF POC FEW (A) NONE SEEN   Sperm NONE SEEN     MDM Labs ordered and reviewed. Likely early pregnancy. Pain likely MS-sciatica. Stable for discharge home.  Assessment and Plan   1. Sciatica of right side   2. Pregnancy of unknown anatomic location   3. Bacterial vaginosis    Discharge home Flagyl 500 mg po bid x7 days Stretching exercises, heat, warm baths, Tylenol prn Follow-up with OB/Gyn of choice-list provided Return to MAU for abd pain or VB   Donette LarryMelanie Anderson Coppock, CNM 12/24/2015, 4:07 PM

## 2015-12-24 NOTE — Discharge Instructions (Signed)
Back Exercises If you have pain in your back, do these exercises 2-3 times each day or as told by your doctor. When the pain goes away, do the exercises once each day, but repeat the steps more times for each exercise (do more repetitions). If you do not have pain in your back, do these exercises once each day or as told by your doctor. EXERCISES Single Knee to Chest Do these steps 3-5 times in a row for each leg:  Lie on your back on a firm bed or the floor with your legs stretched out.  Bring one knee to your chest.  Hold your knee to your chest by grabbing your knee or thigh.  Pull on your knee until you feel a gentle stretch in your lower back.  Keep doing the stretch for 10-30 seconds.  Slowly let go of your leg and straighten it. Pelvic Tilt Do these steps 5-10 times in a row:  Lie on your back on a firm bed or the floor with your legs stretched out.  Bend your knees so they point up to the ceiling. Your feet should be flat on the floor.  Tighten your lower belly (abdomen) muscles to press your lower back against the floor. This will make your tailbone point up to the ceiling instead of pointing down to your feet or the floor.  Stay in this position for 5-10 seconds while you gently tighten your muscles and breathe evenly. Cat-Cow Do these steps until your lower back bends more easily:  Get on your hands and knees on a firm surface. Keep your hands under your shoulders, and keep your knees under your hips. You may put padding under your knees.  Let your head hang down, and make your tailbone point down to the floor so your lower back is round like the back of a cat.  Stay in this position for 5 seconds.  Slowly lift your head and make your tailbone point up to the ceiling so your back hangs low (sags) like the back of a cow.  Stay in this position for 5 seconds. Press-Ups Do these steps 5-10 times in a row:  Lie on your belly (face-down) on the floor.  Place your  hands near your head, about shoulder-width apart.  While you keep your back relaxed and keep your hips on the floor, slowly straighten your arms to raise the top half of your body and lift your shoulders. Do not use your back muscles. To make yourself more comfortable, you may change where you place your hands.  Stay in this position for 5 seconds.  Slowly return to lying flat on the floor. Bridges Do these steps 10 times in a row:  Lie on your back on a firm surface.  Bend your knees so they point up to the ceiling. Your feet should be flat on the floor.  Tighten your butt muscles and lift your butt off of the floor until your waist is almost as high as your knees. If you do not feel the muscles working in your butt and the back of your thighs, slide your feet 1-2 inches farther away from your butt.  Stay in this position for 3-5 seconds.  Slowly lower your butt to the floor, and let your butt muscles relax. If this exercise is too easy, try doing it with your arms crossed over your chest. Belly Crunches Do these steps 5-10 times in a row: 1. Lie on your back on a firm bed  or the floor with your legs stretched out. 2. Bend your knees so they point up to the ceiling. Your feet should be flat on the floor. 3. Cross your arms over your chest. 4. Tip your chin a little bit toward your chest but do not bend your neck. 5. Tighten your belly muscles and slowly raise your chest just enough to lift your shoulder blades a tiny bit off of the floor. 6. Slowly lower your chest and your head to the floor. Back Lifts Do these steps 5-10 times in a row: 1. Lie on your belly (face-down) with your arms at your sides, and rest your forehead on the floor. 2. Tighten the muscles in your legs and your butt. 3. Slowly lift your chest off of the floor while you keep your hips on the floor. Keep the back of your head in line with the curve in your back. Look at the floor while you do this. 4. Stay in this  position for 3-5 seconds. 5. Slowly lower your chest and your face to the floor. GET HELP IF:  Your back pain gets a lot worse when you do an exercise.  Your back pain does not lessen 2 hours after you exercise. If you have any of these problems, stop doing the exercises. Do not do them again unless your doctor says it is okay. GET HELP RIGHT AWAY IF:  You have sudden, very bad back pain. If this happens, stop doing the exercises. Do not do them again unless your doctor says it is okay.   This information is not intended to replace advice given to you by your health care provider. Make sure you discuss any questions you have with your health care provider.   Document Released: 07/17/2010 Document Revised: 03/05/2015 Document Reviewed: 08/08/2014 Elsevier Interactive Patient Education 2016 [employer]  Bacterial Vaginosis Bacterial vaginosis is a vaginal infection that occurs when the normal balance of bacteria in the vagina is disrupted. It results from an overgrowth of certain bacteria. This is the most common vaginal infection in women of childbearing age. Treatment is important to prevent complications, especially in pregnant women, as it can cause a premature delivery. CAUSES  Bacterial vaginosis is caused by an increase in harmful bacteria that are normally present in smaller amounts in the vagina. Several different kinds of bacteria can cause bacterial vaginosis. However, the reason that the condition develops is not fully understood. RISK FACTORS Certain activities or behaviors can put you at an increased risk of developing bacterial vaginosis, including:  Having a new sex partner or multiple sex partners.  Douching.  Using an intrauterine device (IUD) for contraception. Women do not get bacterial vaginosis from toilet seats, bedding, swimming pools, or contact with objects around them. SIGNS AND SYMPTOMS  Some women with bacterial vaginosis have no signs or symptoms. Common  symptoms include:  Grey vaginal discharge.  A fishlike odor with discharge, especially after sexual intercourse.  Itching or burning of the vagina and vulva.  Burning or pain with urination. DIAGNOSIS  Your health care provider will take a medical history and examine the vagina for signs of bacterial vaginosis. A sample of vaginal fluid may be taken. Your health care provider will look at this sample under a microscope to check for bacteria and abnormal cells. A vaginal pH test may also be done.  TREATMENT  Bacterial vaginosis may be treated with antibiotic medicines. These may be given in the form of a pill or a vaginal cream. A  second round of antibiotics may be prescribed if the condition comes back after treatment. Because bacterial vaginosis increases your risk for sexually transmitted diseases, getting treated can help reduce your risk for chlamydia, gonorrhea, HIV, and herpes. HOME CARE INSTRUCTIONS   Only take over-the-counter or prescription medicines as directed by your health care provider.  If antibiotic medicine was prescribed, take it as directed. Make sure you finish it even if you start to feel better.  Tell all sexual partners that you have a vaginal infection. They should see their health care provider and be treated if they have problems, such as a mild rash or itching.  During treatment, it is important that you follow these instructions:  Avoid sexual activity or use condoms correctly.  Do not douche.  Avoid alcohol as directed by your health care provider.  Avoid breastfeeding as directed by your health care provider. SEEK MEDICAL CARE IF:   Your symptoms are not improving after 3 days of treatment.  You have increased discharge or pain.  You have a fever. MAKE SURE YOU:   Understand these instructions.  Will watch your condition.  Will get help right away if you are not doing well or get worse. FOR MORE INFORMATION  Centers for Disease Control and  Prevention, Division of STD Prevention: Solution[employer]s.co.zawww.cdc.gov/std American Sexual Health Association (ASHA): www.ashastd.org    This information is not intended to replace advice given to you by your health care provider. Make sure you discuss any questions you have with your health care provider.   Document Released: 06/14/2005 Document Revised: 07/05/2014 Document Reviewed: 01/24/2013 Elsevier Interactive Patient Education [employer] Inc.

## 2015-12-25 LAB — GC/CHLAMYDIA PROBE AMP (~~LOC~~) NOT AT ARMC
CHLAMYDIA, DNA PROBE: NEGATIVE
NEISSERIA GONORRHEA: NEGATIVE

## 2016-01-06 ENCOUNTER — Inpatient Hospital Stay (HOSPITAL_COMMUNITY): Payer: Medicaid Other

## 2016-01-06 ENCOUNTER — Encounter (HOSPITAL_COMMUNITY): Payer: Self-pay | Admitting: *Deleted

## 2016-01-06 ENCOUNTER — Inpatient Hospital Stay (HOSPITAL_COMMUNITY)
Admission: AD | Admit: 2016-01-06 | Discharge: 2016-01-06 | Disposition: A | Discharge status: Home / Self Care | Payer: Medicaid Other | Source: Ambulatory Visit | Attending: Obstetrics & Gynecology | Admitting: Obstetrics & Gynecology

## 2016-01-06 DIAGNOSIS — R1031 Right lower quadrant pain: Secondary | ICD-10-CM | POA: Diagnosis present

## 2016-01-06 DIAGNOSIS — O26891 Other specified pregnancy related conditions, first trimester: Secondary | ICD-10-CM | POA: Diagnosis not present

## 2016-01-06 DIAGNOSIS — Z3A01 Less than 8 weeks gestation of pregnancy: Secondary | ICD-10-CM | POA: Diagnosis not present

## 2016-01-06 DIAGNOSIS — Z91040 Latex allergy status: Secondary | ICD-10-CM | POA: Diagnosis not present

## 2016-01-06 DIAGNOSIS — O9989 Other specified diseases and conditions complicating pregnancy, childbirth and the puerperium: Secondary | ICD-10-CM

## 2016-01-06 DIAGNOSIS — Z882 Allergy status to sulfonamides status: Secondary | ICD-10-CM | POA: Diagnosis not present

## 2016-01-06 DIAGNOSIS — R109 Unspecified abdominal pain: Secondary | ICD-10-CM | POA: Diagnosis not present

## 2016-01-06 DIAGNOSIS — Z87891 Personal history of nicotine dependence: Secondary | ICD-10-CM | POA: Diagnosis not present

## 2016-01-06 DIAGNOSIS — O26899 Other specified pregnancy related conditions, unspecified trimester: Secondary | ICD-10-CM

## 2016-01-06 LAB — CBC
HCT: 35.4 % — ABNORMAL LOW (ref 36.0–46.0)
Hemoglobin: 11.9 g/dL — ABNORMAL LOW (ref 12.0–15.0)
MCH: 30.4 pg (ref 26.0–34.0)
MCHC: 33.6 g/dL (ref 30.0–36.0)
MCV: 90.5 fL (ref 78.0–100.0)
PLATELETS: 363 10*3/uL (ref 150–400)
RBC: 3.91 MIL/uL (ref 3.87–5.11)
RDW: 12.8 % (ref 11.5–15.5)
WBC: 11.1 10*3/uL — AB (ref 4.0–10.5)

## 2016-01-06 LAB — URINALYSIS, ROUTINE W REFLEX MICROSCOPIC
Bilirubin Urine: NEGATIVE
GLUCOSE, UA: NEGATIVE mg/dL
KETONES UR: NEGATIVE mg/dL
LEUKOCYTES UA: NEGATIVE
NITRITE: NEGATIVE
PH: 5.5 (ref 5.0–8.0)
Protein, ur: NEGATIVE mg/dL

## 2016-01-06 LAB — URINE MICROSCOPIC-ADD ON
BACTERIA UA: NONE SEEN
WBC UA: NONE SEEN WBC/hpf (ref 0–5)

## 2016-01-06 LAB — HCG, QUANTITATIVE, PREGNANCY: hCG, Beta Chain, Quant, S: 10043 m[IU]/mL — ABNORMAL HIGH (ref <5)

## 2016-01-06 MED ORDER — ACETAMINOPHEN 500 MG PO TABS
1000.0000 mg | ORAL_TABLET | Freq: Once | ORAL | Status: AC
  Administered 2016-01-06: 1000 mg via ORAL
  Filled 2016-01-06: qty 2

## 2016-01-06 NOTE — MAU Provider Note (Signed)
History     CSN: [id number]  Arrival date and time: 01/06/16 1231   First Provider Initiated Contact with Patient 01/06/16 1335      Chief Complaint  Patient presents with  . Abdominal Pain   HPI   [name] is a 25 y.o. female [phone] @ Unknown here with abdominal pain. She says she was seen on 6/28 for similar complaints and was told it was likely musculoskeletal pain. She has an appointment scheduled on 7/24 for her first prenatal visit with [facility]. The pain is located in her RLQ, the pain comes and goes. She currently rates her pain 8/10. She has tried taking tylenol which did not help. The pain worsens when she walks.   OB History    Gravida Para Term Preterm AB TAB SAB Ectopic Multiple Living   0 1   1      Past Medical History  Diagnosis Date  . Trichomonas     Past Surgical History  Procedure Laterality Date  . Dilation and curettage of uterus      Family History  Problem Relation Age of Onset  . Cancer Brother   . Hypertension Maternal Grandfather   . Diabetes Brother   . Heart disease Neg Hx     Social History  Substance Use Topics  . Smoking status: Former Smoker    Quit date: 04/02/2013  . Smokeless tobacco: Never Used  . Alcohol Use: No     Comment: occasionally     Allergies:  Allergies  Allergen Reactions  . Latex Rash    In vaginal area  . Sulfa Antibiotics Hives and Rash    Prescriptions prior to admission  Medication Sig Dispense Refill Last Dose  . acetaminophen (TYLENOL) 500 MG tablet Take 1,000 mg by mouth every 6 (six) hours as needed for mild pain or headache.   Past Week at Unknown time  . metroNIDAZOLE (FLAGYL) 500 MG tablet Take 1 tablet (500 mg total) by mouth 2 (two) times daily. 14 tablet 0    Results for orders placed or performed during the hospital encounter of 01/06/16 (from the past 48 hour(s))  Urinalysis, Routine w reflex microscopic (not at [facility])     Status: Abnormal   Collection Time: 01/06/16  12:40 PM  Result Value Ref Range   Color, Urine YELLOW YELLOW   APPearance CLEAR CLEAR   Specific Gravity, Urine >1.030 (H) 1.005 - 1.030   pH 5.5 5.0 - 8.0   Glucose, UA NEGATIVE NEGATIVE mg/dL   Hgb urine dipstick TRACE (A) NEGATIVE   Bilirubin Urine NEGATIVE NEGATIVE   Ketones, ur NEGATIVE NEGATIVE mg/dL   Protein, ur NEGATIVE NEGATIVE mg/dL   Nitrite NEGATIVE NEGATIVE   Leukocytes, UA NEGATIVE NEGATIVE  Urine microscopic-add on     Status: Abnormal   Collection Time: 01/06/16 12:40 PM  Result Value Ref Range   Squamous Epithelial / LPF 0-5 (A) NONE SEEN   WBC, UA NONE SEEN 0 - 5 WBC/hpf   RBC / HPF 0-5 0 - 5 RBC/hpf   Bacteria, UA NONE SEEN NONE SEEN  hCG, quantitative, pregnancy     Status: Abnormal   Collection Time: 01/06/16  1:28 PM  Result Value Ref Range   hCG, Beta Chain, Quant, S 10043 (H) <5 mIU/mL    Comment:          GEST. AGE      CONC.  (mIU/mL)   <=1 WEEK  5 - 50     2 WEEKS       50 - 500     3 WEEKS       100 - 10,000     4 WEEKS     1,000 - 30,000     5 WEEKS     3,500 - 115,000   6-8 WEEKS     12,000 - 270,000    12 WEEKS     15,000 - 220,000        FEMALE AND NON-PREGNANT FEMALE:     LESS THAN 5 mIU/mL   CBC     Status: Abnormal   Collection Time: 01/06/16  1:28 PM  Result Value Ref Range   WBC 11.1 (H) 4.0 - 10.5 K/uL   RBC 3.91 3.87 - 5.11 MIL/uL   Hemoglobin 11.9 (L) 12.0 - 15.0 g/dL   HCT 16.1 (L) 09.6 - 04.5 %   MCV 90.5 78.0 - 100.0 fL   MCH 30.4 26.0 - 34.0 pg   MCHC 33.6 30.0 - 36.0 g/dL   RDW 40.9 81.1 - 91.4 %   Platelets 363 150 - 400 K/uL   US Ob Comp Less 14 Wks  01/06/2016  CLINICAL DATA:  Right lower quadrant pain for 2 weeks. First trimester pregnancy. Unsure of LMP. EXAM: OBSTETRIC <14 WK [place] AND TRANSVAGINAL OB US TECHNIQUE: Both transabdominal and transvaginal ultrasound examinations were performed for complete evaluation of the gestation as well as the maternal uterus, adnexal regions, and pelvic cul-de-sac. Transvaginal  technique was performed to assess early pregnancy. COMPARISON:  None. FINDINGS: Intrauterine gestational sac: Single Yolk sac:  Visualized Embryo:  None visualized MSD: 9  mm   5 w   5  d Subchorionic hemorrhage:  None visualized. Maternal uterus/adnexae: No fibroids identified. Normal appearance of both ovaries. No evidence of adnexal mass or free fluid. IMPRESSION: Single intrauterine gestational sac measuring 5 weeks 5 days by mean sac diameter. Recommend followup of quantitative beta HCG levels, and consider followup ultrasound to assess viability in 10-14 days. Normal appearance of both ovaries. No adnexal mass or free fluid visualized. Electronically Signed   By: Myles Rosenthal M.D.   On: 01/06/2016 14:24   US Ob Transvaginal  01/06/2016  CLINICAL DATA:  Right lower quadrant pain for 2 weeks. First trimester pregnancy. Unsure of LMP. EXAM: OBSTETRIC <14 WK [place] AND TRANSVAGINAL OB US TECHNIQUE: Both transabdominal and transvaginal ultrasound examinations were performed for complete evaluation of the gestation as well as the maternal uterus, adnexal regions, and pelvic cul-de-sac. Transvaginal technique was performed to assess early pregnancy. COMPARISON:  None. FINDINGS: Intrauterine gestational sac: Single Yolk sac:  Visualized Embryo:  None visualized MSD: 9  mm   5 w   5  d Subchorionic hemorrhage:  None visualized. Maternal uterus/adnexae: No fibroids identified. Normal appearance of both ovaries. No evidence of adnexal mass or free fluid. IMPRESSION: Single intrauterine gestational sac measuring 5 weeks 5 days by mean sac diameter. Recommend followup of quantitative beta HCG levels, and consider followup ultrasound to assess viability in 10-14 days. Normal appearance of both ovaries. No adnexal mass or free fluid visualized. Electronically Signed   By: Myles Rosenthal M.D.   On: 01/06/2016 14:24    Review of Systems  Constitutional: Negative for fever and chills.  Gastrointestinal: Positive for nausea and  abdominal pain. Negative for vomiting, diarrhea, constipation and blood in stool.  Genitourinary: Negative for dysuria and urgency.   Physical Exam   Blood pressure  120/66, pulse 93, temperature 97.4 F (36.3 C), temperature source Oral, resp. rate 18, height 5\' 3"  (1.6 m), weight 187 lb 9.6 oz (85.095 kg).  Physical Exam  Constitutional: She is oriented to person, place, and time. She appears well-developed and well-nourished. No distress.  HENT:  Head: Normocephalic.  GI: Soft. Normal appearance. She exhibits no distension and no mass. There is tenderness in the right lower quadrant and suprapubic area. There is no rigidity, no rebound and no guarding.  Neurological: She is alert and oriented to person, place, and time.  Skin: Skin is warm. She is not diaphoretic.  Psychiatric: Her behavior is normal.    MAU Course  Procedures  None  MDM  CBC Quant  US   Tylenol 1 gram PO Patient much better after tylenol; she states she thinks her pain was her "nerves".   Assessment and Plan   A:  1. Abdominal pain in pregnancy, antepartum   2. Abdominal pain in pregnancy, antepartum     P:  Discharge home in stable condition Return to MAU if symptoms worsen Keep OB appointment Prenatal vitamins daily. Pelvic rest until pain subsides     Duane LopeJennifer I Rasch, NP 01/06/2016 3:59 PM

## 2016-01-06 NOTE — MAU Note (Signed)
Been having real sharp pain in RLQ. Pain comes and goes. Was told if comes back to return. Tried to get into dr, unable to be seen. No bleeding.

## 2016-01-06 NOTE — Discharge Instructions (Signed)

## 2016-01-19 ENCOUNTER — Encounter: Payer: Medicaid Other | Admitting: Obstetrics and Gynecology

## 2016-02-05 ENCOUNTER — Inpatient Hospital Stay (HOSPITAL_COMMUNITY): Payer: Medicaid Other

## 2016-02-05 ENCOUNTER — Encounter (HOSPITAL_COMMUNITY): Payer: Self-pay

## 2016-02-05 ENCOUNTER — Inpatient Hospital Stay (HOSPITAL_COMMUNITY)
Admission: AD | Admit: 2016-02-05 | Discharge: 2016-02-05 | Disposition: A | Discharge status: Home / Self Care | Payer: Medicaid Other | Source: Ambulatory Visit | Attending: Obstetrics & Gynecology | Admitting: Obstetrics & Gynecology

## 2016-02-05 DIAGNOSIS — O4691 Antepartum hemorrhage, unspecified, first trimester: Secondary | ICD-10-CM | POA: Diagnosis not present

## 2016-02-05 DIAGNOSIS — O26891 Other specified pregnancy related conditions, first trimester: Secondary | ICD-10-CM | POA: Insufficient documentation

## 2016-02-05 DIAGNOSIS — Z882 Allergy status to sulfonamides status: Secondary | ICD-10-CM | POA: Insufficient documentation

## 2016-02-05 DIAGNOSIS — Z87891 Personal history of nicotine dependence: Secondary | ICD-10-CM | POA: Diagnosis not present

## 2016-02-05 DIAGNOSIS — Z3A10 10 weeks gestation of pregnancy: Secondary | ICD-10-CM | POA: Diagnosis not present

## 2016-02-05 DIAGNOSIS — Z91040 Latex allergy status: Secondary | ICD-10-CM | POA: Diagnosis not present

## 2016-02-05 DIAGNOSIS — O209 Hemorrhage in early pregnancy, unspecified: Secondary | ICD-10-CM | POA: Insufficient documentation

## 2016-02-05 DIAGNOSIS — R109 Unspecified abdominal pain: Secondary | ICD-10-CM | POA: Insufficient documentation

## 2016-02-05 HISTORY — DX: Other specified bacterial agents as the cause of diseases classified elsewhere: N76.0

## 2016-02-05 HISTORY — DX: Other specified bacterial agents as the cause of diseases classified elsewhere: B96.89

## 2016-02-05 LAB — WET PREP, GENITAL
CLUE CELLS WET PREP: NONE SEEN
SPERM: NONE SEEN
TRICH WET PREP: NONE SEEN
Yeast Wet Prep HPF POC: NONE SEEN

## 2016-02-05 LAB — URINE MICROSCOPIC-ADD ON

## 2016-02-05 LAB — URINALYSIS, ROUTINE W REFLEX MICROSCOPIC
BILIRUBIN URINE: NEGATIVE
Glucose, UA: NEGATIVE mg/dL
Ketones, ur: NEGATIVE mg/dL
Leukocytes, UA: NEGATIVE
NITRITE: NEGATIVE
PH: 7 (ref 5.0–8.0)
Protein, ur: NEGATIVE mg/dL
SPECIFIC GRAVITY, URINE: 1.01 (ref 1.005–1.030)

## 2016-02-05 NOTE — MAU Provider Note (Signed)
Chief Complaint: Abdominal Pain and Vaginal Bleeding   First Provider Initiated Contact with Patient 02/05/16 1028      SUBJECTIVE HPI: [name] is a 25 y.o. G4P1021 at [redacted]w[redacted]d by LMP who presents to maternity admissions reporting onset of vaginal spotting 2-3 days ago associated with mild cramping. She has not tried any treatments and the symptom is intermittent and unchanged since onset.   She denies vaginal itching/burning, urinary symptoms, h/a, dizziness, n/v, or fever/chills.     HPI  Past Medical History:  Diagnosis Date  . BV (bacterial vaginosis)   . Trichomonas    Past Surgical History:  Procedure Laterality Date  . DILATION AND CURETTAGE OF UTERUS     Social History   Social History  . Marital status: Single    Spouse name: N/A  . Number of children: N/A  . Years of education: N/A   Occupational History  . Not on file.   Social History Main Topics  . Smoking status: Former Smoker    Quit date: 04/02/2013  . Smokeless tobacco: Never Used  . Alcohol use No     Comment: occasionally   . Drug use: No  . Sexual activity: Yes    Partners: Male    Birth control/ protection: Condom   Other Topics Concern  . Not on file   Social History Narrative  . No narrative on file   No current facility-administered medications on file prior to encounter.    Current Outpatient Prescriptions on File Prior to Encounter  Medication Sig Dispense Refill  . acetaminophen (TYLENOL) 500 MG tablet Take 1,000 mg by mouth every 6 (six) hours as needed for mild pain or headache.    . Prenatal Vit-Fe Fumarate-FA (PRENATAL MULTIVITAMIN) TABS tablet Take 1 tablet by mouth daily at 12 noon.     Allergies  Allergen Reactions  . Latex Rash    In vaginal area  . Sulfa Antibiotics Hives and Rash    ROS:  Review of Systems  Constitutional: Negative for chills, fatigue and fever.  Respiratory: Negative for shortness of breath.   Cardiovascular: Negative for chest pain.   Genitourinary: Positive for pelvic pain and vaginal bleeding. Negative for difficulty urinating, dysuria, flank pain, vaginal discharge and vaginal pain.  Neurological: Negative for dizziness and headaches.  Psychiatric/Behavioral: Negative.      I have reviewed patient's Past Medical Hx, Surgical Hx, Family Hx, Social Hx, medications and allergies.   Physical Exam   Patient Vitals for the past 24 hrs:  BP Temp Temp src Pulse Resp Height Weight  02/05/16 1002 114/56 98.5 F (36.9 C) Oral 85 16 5\' 2"  (1.575 m) -  02/05/16 1610 - - - - - - 189 lb 12 oz (86.1 kg)   Constitutional: Well-developed, well-nourished female in no acute distress.  Cardiovascular: normal rate Respiratory: normal effort GI: Abd soft, non-tender. Pos BS x 4 MS: Extremities nontender, no edema, normal ROM Neurologic: Alert and oriented x 4.  GU: Neg CVAT.  PELVIC EXAM: Cervix pink, visually closed, without lesion, scant white creamy discharge, no bleeding noted, vaginal walls and external genitalia normal Bimanual exam: Cervix 0/long/high, firm, anterior, neg CMT, uterus nontender, nonenlarged, adnexa without tenderness, enlargement, or mass   LAB RESULTS Results for orders placed or performed during the hospital encounter of 02/05/16 (from the past 24 hour(s))  Urinalysis, Routine w reflex microscopic (not at [facility])     Status: Abnormal   Collection Time: 02/05/16  9:50 AM  Result Value Ref  Range   Color, Urine YELLOW YELLOW   APPearance CLOUDY (A) CLEAR   Specific Gravity, Urine 1.010 1.005 - 1.030   pH 7.0 5.0 - 8.0   Glucose, UA NEGATIVE NEGATIVE mg/dL   Hgb urine dipstick TRACE (A) NEGATIVE   Bilirubin Urine NEGATIVE NEGATIVE   Ketones, ur NEGATIVE NEGATIVE mg/dL   Protein, ur NEGATIVE NEGATIVE mg/dL   Nitrite NEGATIVE NEGATIVE   Leukocytes, UA NEGATIVE NEGATIVE  Urine microscopic-add on     Status: Abnormal   Collection Time: 02/05/16  9:50 AM  Result Value Ref Range   Squamous Epithelial /  LPF 6-30 (A) NONE SEEN   WBC, UA 0-5 0 - 5 WBC/hpf   RBC / HPF 0-5 0 - 5 RBC/hpf   Bacteria, UA MANY (A) NONE SEEN  Wet prep, genital     Status: Abnormal   Collection Time: 02/05/16  1:14 PM  Result Value Ref Range   Yeast Wet Prep HPF POC NONE SEEN NONE SEEN   Trich, Wet Prep NONE SEEN NONE SEEN   Clue Cells Wet Prep HPF POC NONE SEEN NONE SEEN   WBC, Wet Prep HPF POC MANY (A) NONE SEEN   Sperm NONE SEEN        IMAGING [place] Ob Transvaginal  Result Date: 02/05/2016 CLINICAL DATA:  25 year old pregnant female presents with bleeding and pelvic cramping. Assigned EDC: 09/02/2016, projecting to an expected gestational age of [redacted] weeks 0 days. EXAM: TRANSVAGINAL OB ULTRASOUND TECHNIQUE: Transvaginal ultrasound was performed for complete evaluation of the gestation as well as the maternal uterus, adnexal regions, and pelvic cul-de-sac. COMPARISON:  01/06/2016 obstetric scan. FINDINGS: Intrauterine gestational sac: Single intrauterine gestational sac appears normal in size, shape and position. Yolk sac:  Present. Fetus: Present. Fetal anatomy not assessed at this early gestational age. Fetal Cardiac Activity: Regular rate and rhythm. Heart Rate: 161 bpm CRL:   30.1  mm   9 w 6 d                  US EDC: 09/03/2016 Subchorionic hemorrhage:  None visualized. Maternal uterus/adnexae: Left ovary measures 2.1 x 1.3 x 2.1 cm. Right ovary measures 3.3 x 2.2 x 2.9 cm and contains a 2.3 cm corpus luteum. No suspicious ovarian or adnexal masses. No abnormal free fluid in the pelvis. No uterine fibroids. IMPRESSION: 1. Single living intrauterine gestation at 9 weeks 6 days by crown-rump length, concordant with assigned EDC. 2. No first-trimester gestational abnormality. Electronically Signed   By: Delbert PhenixJason A Poff M.D.   On: 02/05/2016 12:07    MAU Management/MDM: Ordered labs and reviewed results.  IUP on US today, no evidence of vaginal bleeding.  Wet prep negative, GCC pending.  Pt to start prenatal care as soon  as possible, bleeding precautions/reasons to return to MAU reviewed. Pt stable at time of discharge.  ASSESSMENT 1. Vaginal bleeding in pregnancy, first trimester     PLAN Discharge home    Medication List    STOP taking these medications   metroNIDAZOLE 500 MG tablet Commonly known as:  FLAGYL     TAKE these medications   acetaminophen 500 MG tablet Commonly known as:  TYLENOL Take 1,000 mg by mouth every 6 (six) hours as needed for mild pain or headache.   prenatal multivitamin Tabs tablet Take 1 tablet by mouth daily at 12 noon.      [facility] Information    [facility]. Schedule an appointment as soon as possible for a visit today.  Why:  Return to MAU as needed for emergencies Contact information: [address] [place] [address] [phone]          Sharen Counter Certified Nurse-Midwife 02/05/2016  4:00 PM

## 2016-02-05 NOTE — MAU Note (Addendum)
Had some light bleeding starting on Saturday, not soaking a pad or liner, has lessened since. Having lower abdominal pain 8/10. Takes Tylenol which relieves the pain.

## 2016-02-05 NOTE — MAU Note (Signed)
Discharge instructions reviewed, Pt and RN signed paper discharge instructions and filed with paper chart.

## 2016-02-05 NOTE — Discharge Instructions (Signed)

## 2016-02-05 NOTE — MAU Note (Cosign Needed)
History     CSN: [id number]  Arrival date and time: 02/05/16 0945   None     Chief Complaint  Patient presents with  . Abdominal Pain  . Vaginal Bleeding   HPI  [name] is a 25 y.o. [id number] at 3353w0d who presents with 1 month history of intermittent, sharp lower abdominal pains, worse over the past day. She also noticed some brown blood when she wiped 4 days ago, but none since that time. She describes stomach pain as sharp pains that travel from L to R, worse at work and when on her feet and worse in the mornings. Reports mild nausea, vomiting, and diarrhea. No fevers or chills, dysuria, or abnormal vaginal discharge. She does have a 27-year-old who has had a recent runny nose and slight fever. She has been taking tylenol occasionally with improvement. Remote history of BV and trichomonas infection.  [facility]  OB History    Gravida Para Term Preterm AB Living   4 1 1   2 1    SAB TAB Ectopic Multiple Live Births   1 0     1      Past Medical History:  Diagnosis Date  . BV (bacterial vaginosis)   . Trichomonas     Past Surgical History:  Procedure Laterality Date  . DILATION AND CURETTAGE OF UTERUS      Family History  Problem Relation Age of Onset  . Cancer Brother   . Hypertension Maternal Grandfather   . Diabetes Brother   . Heart disease Neg Hx     Social History  Substance Use Topics  . Smoking status: Former Smoker    Quit date: 04/02/2013  . Smokeless tobacco: Never Used  . Alcohol use No     Comment: occasionally     Allergies:  Allergies  Allergen Reactions  . Latex Rash    In vaginal area  . Sulfa Antibiotics Hives and Rash    Prescriptions Prior to Admission  Medication Sig Dispense Refill Last Dose  . acetaminophen (TYLENOL) 500 MG tablet Take 1,000 mg by mouth every 6 (six) hours as needed for mild pain or headache.   01/06/2016 at Unknown time   . Prenatal Vit-Fe Fumarate-FA (PRENATAL MULTIVITAMIN) TABS tablet  Take 1 tablet by mouth daily at 12 noon.   01/05/2016 at Unknown time    Review of Systems  Constitutional: Negative for chills and fever.  Gastrointestinal: Positive for abdominal pain, diarrhea, nausea and vomiting.  Genitourinary: Negative for dysuria, flank pain and hematuria.   Physical Exam   Blood pressure 114/56, pulse 85, temperature 98.5 F (36.9 C), temperature source Oral, resp. rate 16, height 5\' 2"  (1.575 m), weight 86.1 kg (189 lb 12 oz).  Physical Exam  Constitutional: She appears well-developed and well-nourished.  Cardiovascular: Normal rate, regular rhythm and normal heart sounds.   Respiratory: Effort normal and breath sounds normal.  GI: Soft. Bowel sounds are normal. She exhibits no distension. There is tenderness. There is no CVA tenderness.  Genitourinary: Vagina normal.  Genitourinary Comments: No cervical motion tenderness. Moderate amount of white discharge, cultured.    MAU Course  Procedures  MDM UA with trace Hgb and bacteriuria, 6-30 squams/LPF. Also with WBCs on wet prep, but no organisms. USN normal with intrauterine gestational sac visualized. No abnormalities. GC/Chlamydia pending.   Assessment and Plan  [name] is a 25 y.o. G4P1021 at 2653w0d who presents with 1 month history of intermittent lower abdominal  pains and small amount of vaginal bleeding noted with wiping. No bleeding identified on pelvic exam. [place] today confirmed single living intrauterine gestation, with dating per crown-rump dating at [redacted]w[redacted]d.  - Discussed abdominal pain of pregnancy, likely round ligament pain, and return precautions including increased vaginal bleeding or pain.  - F/u if abnormal results of GC/Chlamydia culture.   Ambrose Finland 02/05/2016, 10:25 AM

## 2016-02-06 LAB — GC/CHLAMYDIA PROBE AMP (~~LOC~~) NOT AT ARMC
CHLAMYDIA, DNA PROBE: NEGATIVE
NEISSERIA GONORRHEA: NEGATIVE

## 2016-03-03 ENCOUNTER — Inpatient Hospital Stay (HOSPITAL_COMMUNITY)
Admission: AD | Admit: 2016-03-03 | Discharge: 2016-03-03 | Disposition: A | Discharge status: Home / Self Care | Payer: Medicaid Other | Source: Ambulatory Visit | Attending: Obstetrics and Gynecology | Admitting: Obstetrics and Gynecology

## 2016-03-03 ENCOUNTER — Encounter (HOSPITAL_COMMUNITY): Payer: Self-pay | Admitting: *Deleted

## 2016-03-03 DIAGNOSIS — O26891 Other specified pregnancy related conditions, first trimester: Secondary | ICD-10-CM | POA: Insufficient documentation

## 2016-03-03 DIAGNOSIS — Z3A13 13 weeks gestation of pregnancy: Secondary | ICD-10-CM | POA: Insufficient documentation

## 2016-03-03 DIAGNOSIS — Z3492 Encounter for supervision of normal pregnancy, unspecified, second trimester: Secondary | ICD-10-CM

## 2016-03-03 DIAGNOSIS — M79671 Pain in right foot: Secondary | ICD-10-CM | POA: Insufficient documentation

## 2016-03-03 DIAGNOSIS — O9A211 Injury, poisoning and certain other consequences of external causes complicating pregnancy, first trimester: Secondary | ICD-10-CM

## 2016-03-03 DIAGNOSIS — W109XXA Fall (on) (from) unspecified stairs and steps, initial encounter: Secondary | ICD-10-CM | POA: Insufficient documentation

## 2016-03-03 DIAGNOSIS — Z87891 Personal history of nicotine dependence: Secondary | ICD-10-CM | POA: Insufficient documentation

## 2016-03-03 DIAGNOSIS — S99921A Unspecified injury of right foot, initial encounter: Secondary | ICD-10-CM

## 2016-03-03 DIAGNOSIS — W08XXXA Fall from other furniture, initial encounter: Secondary | ICD-10-CM

## 2016-03-03 LAB — URINALYSIS, ROUTINE W REFLEX MICROSCOPIC
Bilirubin Urine: NEGATIVE
GLUCOSE, UA: NEGATIVE mg/dL
Ketones, ur: 15 mg/dL — AB
Leukocytes, UA: NEGATIVE
Nitrite: NEGATIVE
PROTEIN: NEGATIVE mg/dL
Specific Gravity, Urine: >1.03 — ABNORMAL HIGH (ref 1.005–1.030)
pH: 5.5 (ref 5.0–8.0)

## 2016-03-03 LAB — URINE MICROSCOPIC-ADD ON

## 2016-03-03 NOTE — MAU Note (Signed)
Was up on the stepstool, fell into prep table, hit upper right side. Twisted rt ankle- ball of foot is what is hurting. Just wanted to make sure everything is ok.

## 2016-03-03 NOTE — Discharge Instructions (Signed)
What Do I Need to Know About Injuries During Pregnancy? °Trauma is the most common cause of injury and death in pregnant women. This can also result in significant harm or death of the baby. °Your baby is protected in the womb (uterus) by a sac filled with fluid (amniotic sac). Your baby can be harmed if there is direct, high-impact trauma to your abdomen and pelvis. This type of trauma can result in tearing of your uterus, the placenta pulling away from the wall of the uterus (placenta abruption), or the amniotic sac breaking open (rupture of membranes). These injuries can decrease or stop the blood supply to your baby or cause you to go into labor earlier than expected. Minor falls and low-impact automobile accidents do not usually harm your baby, even if they do minimally harm you. °WHAT KIND OF INJURIES CAN AFFECT MY PREGNANCY? °The most common causes of injury or death to a baby include: °· Falls. Falls are more common in the second and third trimester of the pregnancy. Factors that increase your risk of falling include: °¨ Increase in your weight. °¨ The change in your center of gravity. °¨ Tripping over an object that cannot be seen. °¨ Increased looseness (laxity) of your ligaments resulting in less coordinated movements (you may feel clumsy). °¨ Falling during high-risk activities like horseback riding or skiing. °· Automobile accidents. It is important to wear your seat belt properly, with the lap belt below your abdomen, and always practice safe driving. °· Domestic violence or assault. °· Burns (fire or electrical). °The most common causes of injury or death to the pregnant woman include: °· Injuries that cause severe bleeding, shock, and loss of blood flow to major organs. °· Head and neck injuries that result in severe brain or spinal damage. °· Chest trauma that can cause direct injury to the heart and lungs or any injury that affects the area enclosed by the ribs. Trauma to this area can result in  cardiorespiratory arrest. °WHAT CAN I DO TO PROTECT MYSELF AND MY BABY FROM INJURY WHILE I AM PREGNANT? °· Remove slippery rugs and loose objects on the floor that increase your risk of tripping. °· Avoid walking on wet or slippery floors. °· Wear comfortable shoes that have a good grip on the sole. Do not wear high-heeled shoes. °· Always wear your seat belt properly, with the lap belt below your abdomen, and always practice safe driving. Do not ride on a motorcycle while pregnant. °· Do not participate in high-impact activities or sports. °· Avoid fires, starting fires, lifting heavy pots of boiling or hot liquids, and fixing electrical problems. °· Only take over-the-counter or prescription medicines for pain, fever, or discomfort as directed by your health care provider. °· Know your blood type and the father's blood type in case you develop vaginal bleeding or experience an injury for which a blood transfusion may be necessary. °· Call your local emergency services (911 in the [place] if you are a victim of domestic violence or assault. Spousal abuse can be a significant cause of trauma during pregnancy. For help and support, contact the National Domestic Violence Hotline. °WHEN SHOULD I SEEK IMMEDIATE MEDICAL CARE?  °· You fall on your abdomen or experience any high-force accident or injury. °· You have been assaulted (domestic or otherwise). °· You have been in a car accident. °· You develop vaginal bleeding. °· You develop fluid leaking from the vagina. °· You develop uterine contractions (pelvic cramping, pain, or significant low back   pain). °· You become weak or faint, or have uncontrolled vomiting after trauma. °· You had a serious burn. This includes burns to the face, neck, hands, or genitals, or burns greater than the size of your palm anywhere else. °· You develop neck stiffness or pain after a fall or from other trauma. °· You develop a headache or vision problems after a fall or from other  trauma. °· You do not feel the baby moving or the baby is not moving as much as before a fall or other trauma. °  °This information is not intended to replace advice given to you by your health care provider. Make sure you discuss any questions you have with your health care provider. °  °Document Released: 07/22/2004 Document Revised: 07/05/2014 Document Reviewed: 03/21/2013 °Elsevier Interactive Patient Education ©2016 [employer] ° °

## 2016-03-03 NOTE — MAU Provider Note (Signed)
History     CSN: [id number]  Arrival date and time: 03/03/16 1409   First Provider Initiated Contact with Patient 03/03/16 1530      Chief Complaint  Patient presents with  . [facility] [name] is a 25 y.o. [phone] at [redacted]w[redacted]d who presents s/p fall. Fell from step stool while at work. Hit right side on table & caught foot under stool. Did not hit head or lose consciousness. Denies abdominal pain or vaginal bleeding. Has not started prenatal care.    Fall  The accident occurred 1 to 3 hours ago. The fall occurred from a stool. She fell from a height of 1 to 2 ft. She landed on hard floor. There was no blood loss. The point of impact was the right foot (& right side). The pain is present in the right foot (right side). The pain is at a severity of 6/10. The symptoms are aggravated by pressure on injury. Pertinent negatives include no abdominal pain or loss of consciousness. She has tried nothing for the symptoms.    OB History    Gravida Para Term Preterm AB Living   4 1 1   2 1    SAB TAB Ectopic Multiple Live Births   1 0     1      Past Medical History:  Diagnosis Date  . BV (bacterial vaginosis)   . Trichomonas     Past Surgical History:  Procedure Laterality Date  . DILATION AND CURETTAGE OF UTERUS      Family History  Problem Relation Age of Onset  . Cancer Brother   . Hypertension Maternal Grandfather   . Diabetes Brother   . Heart disease Neg Hx     Social History  Substance Use Topics  . Smoking status: Former Smoker    Quit date: 04/02/2013  . Smokeless tobacco: Never Used  . Alcohol use No     Comment: occasionally     Allergies:  Allergies  Allergen Reactions  . Latex Rash    In vaginal area  . Sulfa Antibiotics Hives and Rash    Prescriptions Prior to Admission  Medication Sig Dispense Refill Last Dose  . acetaminophen (TYLENOL) 500 MG tablet Take 1,000 mg by mouth every 6 (six) hours as needed for mild pain or headache.   01/06/2016 at Unknown  time  . Prenatal Vit-Fe Fumarate-FA (PRENATAL MULTIVITAMIN) TABS tablet Take 1 tablet by mouth daily at 12 noon.   02/03/2016    Review of Systems  Constitutional: Negative.   Gastrointestinal: Negative.  Negative for abdominal pain.  Genitourinary: Negative.   Musculoskeletal: Positive for falls.       Right foot pain  Neurological: Negative for loss of consciousness.   Physical Exam   Blood pressure 110/63, pulse 89, temperature 98.4 F (36.9 C), temperature source Oral, resp. rate 18.  Physical Exam  Nursing note and vitals reviewed. Constitutional: She is oriented to person, place, and time. She appears well-developed and well-nourished. No distress.  HENT:  Head: Normocephalic and atraumatic.  Eyes: Conjunctivae are normal. Right eye exhibits no discharge. Left eye exhibits no discharge. No scleral icterus.  Neck: Normal range of motion.  Respiratory: Effort normal. No respiratory distress.  GI: Soft. Normal appearance. There is no tenderness.  Musculoskeletal: Normal range of motion. She exhibits no edema, tenderness or deformity.       Right ankle: Normal. She exhibits normal range of motion, no swelling and no deformity. No tenderness.  Right foot: Normal. There is no tenderness, no swelling and no deformity.  Neurological: She is alert and oriented to person, place, and time.  Skin: Skin is warm and dry. She is not diaphoretic.  Psychiatric: She has a normal mood and affect. Her behavior is normal. Judgment and thought content normal.    MAU Course  Procedures Results for orders placed or performed during the hospital encounter of 03/03/16 (from the past 24 hour(s))  Urinalysis, Routine w reflex microscopic (not at [facility])     Status: Abnormal   Collection Time: 03/03/16  2:40 PM  Result Value Ref Range   Color, Urine YELLOW YELLOW   APPearance CLEAR CLEAR   Specific Gravity, Urine >1.030 (H) 1.005 - 1.030   pH 5.5 5.0 - 8.0   Glucose, UA NEGATIVE NEGATIVE mg/dL    Hgb urine dipstick TRACE (A) NEGATIVE   Bilirubin Urine NEGATIVE NEGATIVE   Ketones, ur 15 (A) NEGATIVE mg/dL   Protein, ur NEGATIVE NEGATIVE mg/dL   Nitrite NEGATIVE NEGATIVE   Leukocytes, UA NEGATIVE NEGATIVE  Urine microscopic-add on     Status: Abnormal   Collection Time: 03/03/16  2:40 PM  Result Value Ref Range   Squamous Epithelial / LPF 6-30 (A) NONE SEEN   WBC, UA 6-30 0 - 5 WBC/hpf   RBC / HPF 0-5 0 - 5 RBC/hpf   Bacteria, UA MANY (A) NONE SEEN   Crystals CA OXALATE CRYSTALS (A) NEGATIVE    MDM FHT 154 by doppler NAD, VSS Normal ROM of right ankle & foot; no deformity, edema, or tenderness; no bruising or tenderness to right side.  Assessment and Plan  A; 1. Fall involving stool as cause of accidental injury   2. Fetal heart tones present, second trimester   3. Foot pain, right     P: Discharge home Take tylenol prn pain Alternate ice/heat to affected area If symptoms worsen go to urgent care or ED If abdominal pain or vaginal bleeding occurs return to MAU Start prenatal care ASAP  [name] 03/03/2016, 3:30 PM

## 2016-03-15 ENCOUNTER — Encounter: Payer: Self-pay | Admitting: Obstetrics and Gynecology

## 2016-03-15 ENCOUNTER — Ambulatory Visit (INDEPENDENT_AMBULATORY_CARE_PROVIDER_SITE_OTHER): Payer: Medicaid Other | Admitting: Obstetrics and Gynecology

## 2016-03-15 ENCOUNTER — Encounter: Payer: Self-pay | Admitting: Obstetrics & Gynecology

## 2016-03-15 DIAGNOSIS — E669 Obesity, unspecified: Secondary | ICD-10-CM | POA: Insufficient documentation

## 2016-03-15 DIAGNOSIS — O99210 Obesity complicating pregnancy, unspecified trimester: Secondary | ICD-10-CM

## 2016-03-15 DIAGNOSIS — Z124 Encounter for screening for malignant neoplasm of cervix: Secondary | ICD-10-CM | POA: Diagnosis not present

## 2016-03-15 DIAGNOSIS — Z6833 Body mass index (BMI) 33.0-33.9, adult: Secondary | ICD-10-CM | POA: Diagnosis not present

## 2016-03-15 DIAGNOSIS — O99212 Obesity complicating pregnancy, second trimester: Secondary | ICD-10-CM | POA: Diagnosis not present

## 2016-03-15 NOTE — Progress Notes (Signed)
New OB Note  03/15/2016   Clinic: [facility] for [facility]  Chief Complaint: NOB  Transfer of Care Patient: no  History of Present Illness: [name] is a 25 y.o. [id number] @ 15/4 weeks (EDC 3/8, based on 5wk u/s, LMP unknown), with the above CC. Preg complicated by has Supervision of normal pregnancy in second trimester; BMI 33.0-33.9,adult; and Obesity in pregnancy on her problem list.   Her periods were: irregular after Nexplanon removal earlier this year.  She was using no method when she conceived.  She has Negative signs or symptoms of nausea/vomiting of pregnancy. She has Negative signs or symptoms of miscarriage or preterm labor On any different medications around the time she conceived/early pregnancy: No   ROS: A 12-point review of systems was performed and negative, except as stated in the above HPI.  OBGYN History: As per HPI. OB History  Gravida Para Term Preterm AB Living  4 1 1  0 2 1  SAB TAB Ectopic Multiple Live Births  1 0 0 0 1    # Outcome Date GA Lbr Len/2nd Weight Sex Delivery Anes PTL Lv  4 Current           3 Term 12/06/13 [redacted]w[redacted]d 10:43 / 00:44 7 lb 0.2 oz (3.181 kg) M Vag-Spont Local, EPI  LIV  2 AB 06/28/09 [redacted]w[redacted]d         1 SAB               Any issues with any prior pregnancies: no Any prior children are healthy, doing well, without any problems or issues: yes History of pap smears: Yes. Last pap smear unknown (?few years ago). Abnormal: no History of STIs: Yes, in the remote past   Past Medical History: Past Medical History:  Diagnosis Date  . BV (bacterial vaginosis)   . Trichomonas     Past Surgical History: Past Surgical History:  Procedure Laterality Date  . DILATION AND CURETTAGE OF UTERUS      Family History:  Family History  Problem Relation Age of Onset  . Cancer Brother   . Hypertension Maternal Grandfather   . Diabetes Brother   . Heart disease Neg Hx    She denies any female cancers, bleeding or blood clotting  disorders.  She denies any history of mental retardation, birth defects or genetic disorders in her or the FOB's history  Social History:  Social History   Social History  . Marital status: Single    Spouse name: N/A  . Number of children: N/A  . Years of education: N/A   Occupational History  . Not on file.   Social History Main Topics  . Smoking status: Former Smoker    Quit date: 04/02/2013  . Smokeless tobacco: Never Used  . Alcohol use No     Comment: occasionally   . Drug use: No  . Sexual activity: Yes    Partners: Male   Other Topics Concern  . Not on file   Social History Narrative  . No narrative on file  Works at [employer]  Allergy: Allergies  Allergen Reactions  . Latex Rash    In vaginal area  . Sulfa Antibiotics Hives and Rash    Health Maintenance:  Mammogram Up to Date: not applicable  Current Outpatient Medications: PNV  Physical Exam:   BP 99/66   Pulse 89   Wt 181 lb (82.1 kg)   LMP  (LMP Unknown) Comment: Nexplanon removed in May, no period since.  BMI 33.11 kg/m  Body mass index is 33.11 kg/m. FHTs: 140s-150s  General appearance: Well nourished, well developed female in no acute distress.  Neck:  Supple, normal appearance, and no thyromegaly  Cardiovascular: S1, S2 normal, no murmur, rub or gallop, regular rate and rhythm Respiratory:  Clear to auscultation bilateral. Normal respiratory effort Abdomen: positive bowel sounds and no masses, hernias; diffusely non tender to palpation, non distended Breasts: breasts appear normal, no suspicious masses, no skin or nipple changes or axillary nodes, normal inspection. Neuro/Psych:  Normal mood and affect.  Skin:  Warm and dry.  Lymphatic:  No inguinal lymphadenopathy.   Pelvic exam: is not limited by body habitus EGBUS: within normal limits, Vagina: within normal limits and with no blood in the vault, Cervix: normal appearing cervix without discharge or lesions, closed/long/high,  Uterus:  enlarged, c/w 16 week size, and Adnexa:  normal adnexa and no mass, fullness, tenderness  Laboratory: none  Imaging:  As per HPI  Assessment: pt doing wel  Plan: 1. BMI 33.0-33.9,adult Early 1hr, baseline tsh and pre-x labs today  2. Obesity in pregnancy Routine labs. Pt declines genetic screening. Schedule anatomy u/s for 18-20wks - Prenatal Profile - Glucose Tolerance, 1 HR (50g) w/o Fasting - COMPLETE METABOLIC PANEL WITH GFR - Protein / Creatinine Ratio, Urine - TSH - Cytology - PAP - Urine Culture  Problem list reviewed and updated.  Follow up in 4 weeks.  >50% of 25 min visit spent on counseling and coordination of care.     [name], Jr. MD Attending [facility](Faculty Practice)

## 2016-03-16 LAB — PRENATAL PROFILE (SOLSTAS)
Antibody Screen: NEGATIVE
BASOS PCT: 0 %
Basophils Absolute: 0 cells/uL (ref 0–200)
EOS ABS: 112 {cells}/uL (ref 15–500)
Eosinophils Relative: 1 %
HEMATOCRIT: 36.1 % (ref 35.0–45.0)
HEP B S AG: NEGATIVE
HIV: NONREACTIVE
Hemoglobin: 12 g/dL (ref 11.7–15.5)
LYMPHS ABS: 2352 {cells}/uL (ref 850–3900)
Lymphocytes Relative: 21 %
MCH: 31 pg (ref 27.0–33.0)
MCHC: 33.2 g/dL (ref 32.0–36.0)
MCV: 93.3 fL (ref 80.0–100.0)
MONO ABS: 896 {cells}/uL (ref 200–950)
MPV: 10.1 fL (ref 7.5–12.5)
Monocytes Relative: 8 %
NEUTROS PCT: 70 %
Neutro Abs: 7840 cells/uL — ABNORMAL HIGH (ref 1500–7800)
PLATELETS: 367 10*3/uL (ref 140–400)
RBC: 3.87 MIL/uL (ref 3.80–5.10)
RDW: 13.2 % (ref 11.0–15.0)
Rh Type: POSITIVE
Rubella: 3.93 Index — ABNORMAL HIGH (ref <0.90)
WBC: 11.2 10*3/uL — AB (ref 3.8–10.8)

## 2016-03-16 LAB — COMPLETE METABOLIC PANEL WITH GFR
ALT: 8 U/L (ref 6–29)
AST: 14 U/L (ref 10–30)
Albumin: 3.8 g/dL (ref 3.6–5.1)
Alkaline Phosphatase: 47 U/L (ref 33–115)
BUN: 5 mg/dL — ABNORMAL LOW (ref 7–25)
CHLORIDE: 104 mmol/L (ref 98–110)
CO2: 19 mmol/L — AB (ref 20–31)
CREATININE: 0.54 mg/dL (ref 0.50–1.10)
Calcium: 9.1 mg/dL (ref 8.6–10.2)
GFR, Est Non African American: >89 mL/min (ref >=60)
Glucose, Bld: 78 mg/dL (ref 65–99)
Potassium: 3.7 mmol/L (ref 3.5–5.3)
Sodium: 136 mmol/L (ref 135–146)
TOTAL PROTEIN: 6.8 g/dL (ref 6.1–8.1)
Total Bilirubin: 0.5 mg/dL (ref 0.2–1.2)

## 2016-03-16 LAB — GLUCOSE TOLERANCE, 1 HOUR (50G) W/O FASTING: Glucose, 1 Hr, gestational: 79 mg/dL (ref <140)

## 2016-03-16 LAB — PROTEIN / CREATININE RATIO, URINE
CREATININE, URINE: 166 mg/dL (ref 20–320)
PROTEIN CREATININE RATIO: 108 mg/g{creat} (ref 21–161)
Total Protein, Urine: 18 mg/dL (ref 5–24)

## 2016-03-16 LAB — CYTOLOGY - PAP

## 2016-03-16 LAB — TSH: TSH: 1.38 m[IU]/L

## 2016-03-17 LAB — URINE CULTURE

## 2016-03-18 ENCOUNTER — Encounter: Payer: Self-pay | Admitting: *Deleted

## 2016-04-08 ENCOUNTER — Encounter (HOSPITAL_COMMUNITY): Payer: Self-pay | Admitting: Obstetrics and Gynecology

## 2016-04-12 ENCOUNTER — Encounter: Payer: Self-pay | Admitting: Family Medicine

## 2016-04-12 ENCOUNTER — Ambulatory Visit (INDEPENDENT_AMBULATORY_CARE_PROVIDER_SITE_OTHER): Payer: Medicaid Other | Admitting: Family Medicine

## 2016-04-12 VITALS — BP 119/69 | HR 93 | Wt 183.0 lb

## 2016-04-12 DIAGNOSIS — Z3482 Encounter for supervision of other normal pregnancy, second trimester: Secondary | ICD-10-CM

## 2016-04-12 NOTE — Patient Instructions (Signed)
Third Trimester of Pregnancy The third trimester is from week 29 through week 42, months 7 through 9. The third trimester is a time when the fetus is growing rapidly. At the end of the ninth month, the fetus is about 20 inches in length and weighs 6-10 pounds.  BODY CHANGES Your body goes through many changes during pregnancy. The changes vary from woman to woman.   Your weight will continue to increase. You can expect to gain 25-35 pounds (11-16 kg) by the end of the pregnancy.  You may begin to get stretch marks on your hips, abdomen, and breasts.  You may urinate more often because the fetus is moving lower into your pelvis and pressing on your bladder.  You may develop or continue to have heartburn as a result of your pregnancy.  You may develop constipation because certain hormones are causing the muscles that push waste through your intestines to slow down.  You may develop hemorrhoids or swollen, bulging veins (varicose veins).  You may have pelvic pain because of the weight gain and pregnancy hormones relaxing your joints between the bones in your pelvis. Backaches may result from overexertion of the muscles supporting your posture.  You may have changes in your hair. These can include thickening of your hair, rapid growth, and changes in texture. Some women also have hair loss during or after pregnancy, or hair that feels dry or thin. Your hair will most likely return to normal after your baby is born.  Your breasts will continue to grow and be tender. A yellow discharge may leak from your breasts called colostrum.  Your belly button may stick out.  You may feel short of breath because of your expanding uterus.  You may notice the fetus "dropping," or moving lower in your abdomen.  You may have a bloody mucus discharge. This usually occurs a few days to a week before labor begins.  Your cervix becomes thin and soft (effaced) near your due date. WHAT TO EXPECT AT YOUR  PRENATAL EXAMS  You will have prenatal exams every 2 weeks until week 36. Then, you will have weekly prenatal exams. During a routine prenatal visit:  You will be weighed to make sure you and the fetus are growing normally.  Your blood pressure is taken.  Your abdomen will be measured to track your baby's growth.  The fetal heartbeat will be listened to.  Any test results from the previous visit will be discussed.  You may have a cervical check near your due date to see if you have effaced. At around 36 weeks, your caregiver will check your cervix. At the same time, your caregiver will also perform a test on the secretions of the vaginal tissue. This test is to determine if a type of bacteria, Group B streptococcus, is present. Your caregiver will explain this further. Your caregiver may ask you:  What your birth plan is.  How you are feeling.  If you are feeling the baby move.  If you have had any abnormal symptoms, such as leaking fluid, bleeding, severe headaches, or abdominal cramping.  If you are using any tobacco products, including cigarettes, chewing tobacco, and electronic cigarettes.  If you have any questions. Other tests or screenings that may be performed during your third trimester include:  Blood tests that check for low iron levels (anemia).  Fetal testing to check the health, activity level, and growth of the fetus. Testing is done if you have certain medical conditions or if   there are problems during the pregnancy.  HIV (human immunodeficiency virus) testing. If you are at high risk, you may be screened for HIV during your third trimester of pregnancy. FALSE LABOR You may feel small, irregular contractions that eventually go away. These are called Braxton Hicks contractions, or false labor. Contractions may last for hours, days, or even weeks before true labor sets in. If contractions come at regular intervals, intensify, or become painful, it is best to be seen  by your caregiver.  SIGNS OF LABOR   Menstrual-like cramps.  Contractions that are 5 minutes apart or less.  Contractions that start on the top of the uterus and spread down to the lower abdomen and back.  A sense of increased pelvic pressure or back pain.  A watery or bloody mucus discharge that comes from the vagina. If you have any of these signs before the 37th week of pregnancy, call your caregiver right away. You need to go to the hospital to get checked immediately. HOME CARE INSTRUCTIONS   Avoid all smoking, herbs, alcohol, and unprescribed drugs. These chemicals affect the formation and growth of the baby.  Do not use any tobacco products, including cigarettes, chewing tobacco, and electronic cigarettes. If you need help quitting, ask your health care provider. You may receive counseling support and other resources to help you quit.  Follow your caregiver's instructions regarding medicine use. There are medicines that are either safe or unsafe to take during pregnancy.  Exercise only as directed by your caregiver. Experiencing uterine cramps is a good sign to stop exercising.  Continue to eat regular, healthy meals.  Wear a good support bra for breast tenderness.  Do not use hot tubs, steam rooms, or saunas.  Wear your seat belt at all times when driving.  Avoid raw meat, uncooked cheese, cat litter boxes, and soil used by cats. These carry germs that can cause birth defects in the baby.  Take your prenatal vitamins.  Take 1500-2000 mg of calcium daily starting at the 20th week of pregnancy until you deliver your baby.  Try taking a stool softener (if your caregiver approves) if you develop constipation. Eat more high-fiber foods, such as fresh vegetables or fruit and whole grains. Drink plenty of fluids to keep your urine clear or pale yellow.  Take warm sitz baths to soothe any pain or discomfort caused by hemorrhoids. Use hemorrhoid cream if your caregiver  approves.  If you develop varicose veins, wear support hose. Elevate your feet for 15 minutes, 3-4 times a day. Limit salt in your diet.  Avoid heavy lifting, wear low heal shoes, and practice good posture.  Rest a lot with your legs elevated if you have leg cramps or low back pain.  Visit your dentist if you have not gone during your pregnancy. Use a soft toothbrush to brush your teeth and be gentle when you floss.  A sexual relationship may be continued unless your caregiver directs you otherwise.  Do not travel far distances unless it is absolutely necessary and only with the approval of your caregiver.  Take prenatal classes to understand, practice, and ask questions about the labor and delivery.  Make a trial run to the hospital.  Pack your hospital bag.  Prepare the baby's nursery.  Continue to go to all your prenatal visits as directed by your caregiver. SEEK MEDICAL CARE IF:  You are unsure if you are in labor or if your water has broken.  You have dizziness.  You have   mild pelvic cramps, pelvic pressure, or nagging pain in your abdominal area.  You have persistent nausea, vomiting, or diarrhea.  You have a bad smelling vaginal discharge.  You have pain with urination. SEEK IMMEDIATE MEDICAL CARE IF:   You have a fever.  You are leaking fluid from your vagina.  You have spotting or bleeding from your vagina.  You have severe abdominal cramping or pain.  You have rapid weight loss or gain.  You have shortness of breath with chest pain.  You notice sudden or extreme swelling of your face, hands, ankles, feet, or legs.  You have not felt your baby move in over an hour.  You have severe headaches that do not go away with medicine.  You have vision changes.   This information is not intended to replace advice given to you by your health care provider. Make sure you discuss any questions you have with your health care provider.   Document Released:  06/08/2001 Document Revised: 07/05/2014 Document Reviewed: 08/15/2012 Elsevier Interactive Patient Education 2016 [employer]  Breastfeeding Deciding to breastfeed is one of the best choices you can make for you and your baby. A change in hormones during pregnancy causes your breast tissue to grow and increases the number and size of your milk ducts. These hormones also allow proteins, sugars, and fats from your blood supply to make breast milk in your milk-producing glands. Hormones prevent breast milk from being released before your baby is born as well as prompt milk flow after birth. Once breastfeeding has begun, thoughts of your baby, as well as his or her sucking or crying, can stimulate the release of milk from your milk-producing glands.  BENEFITS OF BREASTFEEDING For Your Baby  Your first milk (colostrum) helps your baby's digestive system function better.  There are antibodies in your milk that help your baby fight off infections.  Your baby has a lower incidence of asthma, allergies, and sudden infant death syndrome.  The nutrients in breast milk are better for your baby than infant formulas and are designed uniquely for your baby's needs.  Breast milk improves your baby's brain development.  Your baby is less likely to develop other conditions, such as childhood obesity, asthma, or type 2 diabetes mellitus. For You  Breastfeeding helps to create a very special bond between you and your baby.  Breastfeeding is convenient. Breast milk is always available at the correct temperature and costs nothing.  Breastfeeding helps to burn calories and helps you lose the weight gained during pregnancy.  Breastfeeding makes your uterus contract to its prepregnancy size faster and slows bleeding (lochia) after you give birth.   Breastfeeding helps to lower your risk of developing type 2 diabetes mellitus, osteoporosis, and breast or ovarian cancer later in life. SIGNS THAT YOUR BABY IS  HUNGRY Early Signs of Hunger  Increased alertness or activity.  Stretching.  Movement of the head from side to side.  Movement of the head and opening of the mouth when the corner of the mouth or cheek is stroked (rooting).  Increased sucking sounds, smacking lips, cooing, sighing, or squeaking.  Hand-to-mouth movements.  Increased sucking of fingers or hands. Late Signs of Hunger  Fussing.  Intermittent crying. Extreme Signs of Hunger Signs of extreme hunger will require calming and consoling before your baby will be able to breastfeed successfully. Do not wait for the following signs of extreme hunger to occur before you initiate breastfeeding:  Restlessness.  A loud, strong cry.  Screaming.   BREASTFEEDING BASICS Breastfeeding Initiation  Find a comfortable place to sit or lie down, with your neck and back well supported.  Place a pillow or rolled up blanket under your baby to bring him or her to the level of your breast (if you are seated). Nursing pillows are specially designed to help support your arms and your baby while you breastfeed.  Make sure that your baby's abdomen is facing your abdomen.  Gently massage your breast. With your fingertips, massage from your chest wall toward your nipple in a circular motion. This encourages milk flow. You may need to continue this action during the feeding if your milk flows slowly.  Support your breast with 4 fingers underneath and your thumb above your nipple. Make sure your fingers are well away from your nipple and your baby's mouth.  Stroke your baby's lips gently with your finger or nipple.  When your baby's mouth is open wide enough, quickly bring your baby to your breast, placing your entire nipple and as much of the colored area around your nipple (areola) as possible into your baby's mouth.  More areola should be visible above your baby's upper lip than below the lower lip.  Your baby's tongue should be between his  or her lower gum and your breast.  Ensure that your baby's mouth is correctly positioned around your nipple (latched). Your baby's lips should create a seal on your breast and be turned out (everted).  It is common for your baby to suck about 2-3 minutes in order to start the flow of breast milk. Latching Teaching your baby how to latch on to your breast properly is very important. An improper latch can cause nipple pain and decreased milk supply for you and poor weight gain in your baby. Also, if your baby is not latched onto your nipple properly, he or she may swallow some air during feeding. This can make your baby fussy. Burping your baby when you switch breasts during the feeding can help to get rid of the air. However, teaching your baby to latch on properly is still the best way to prevent fussiness from swallowing air while breastfeeding. Signs that your baby has successfully latched on to your nipple:  Silent tugging or silent sucking, without causing you pain.  Swallowing heard between every 3-4 sucks.  Muscle movement above and in front of his or her ears while sucking. Signs that your baby has not successfully latched on to nipple:  Sucking sounds or smacking sounds from your baby while breastfeeding.  Nipple pain. If you think your baby has not latched on correctly, slip your finger into the corner of your baby's mouth to break the suction and place it between your baby's gums. Attempt breastfeeding initiation again. Signs of Successful Breastfeeding Signs from your baby:  A gradual decrease in the number of sucks or complete cessation of sucking.  Falling asleep.  Relaxation of his or her body.  Retention of a small amount of milk in his or her mouth.  Letting go of your breast by himself or herself. Signs from you:  Breasts that have increased in firmness, weight, and size 1-3 hours after feeding.  Breasts that are softer immediately after  breastfeeding.  Increased milk volume, as well as a change in milk consistency and color by the fifth day of breastfeeding.  Nipples that are not sore, cracked, or bleeding. Signs That Your Baby is Getting Enough Milk  Wetting at least 3 diapers in a 24-hour period.   The urine should be clear and pale yellow by age 5 days.  At least 3 stools in a 24-hour period by age 5 days. The stool should be soft and yellow.  At least 3 stools in a 24-hour period by age 7 days. The stool should be seedy and yellow.  No loss of weight greater than 10% of birth weight during the first 3 days of age.  Average weight gain of 4-7 ounces (113-198 g) per week after age 4 days.  Consistent daily weight gain by age 5 days, without weight loss after the age of 2 weeks. After a feeding, your baby may spit up a small amount. This is common. BREASTFEEDING FREQUENCY AND DURATION Frequent feeding will help you make more milk and can prevent sore nipples and breast engorgement. Breastfeed when you feel the need to reduce the fullness of your breasts or when your baby shows signs of hunger. This is called "breastfeeding on demand." Avoid introducing a pacifier to your baby while you are working to establish breastfeeding (the first 4-6 weeks after your baby is born). After this time you may choose to use a pacifier. Research has shown that pacifier use during the first year of a baby's life decreases the risk of sudden infant death syndrome (SIDS). Allow your baby to feed on each breast as long as he or she wants. Breastfeed until your baby is finished feeding. When your baby unlatches or falls asleep while feeding from the first breast, offer the second breast. Because newborns are often sleepy in the first few weeks of life, you may need to awaken your baby to get him or her to feed. Breastfeeding times will vary from baby to baby. However, the following rules can serve as a guide to help you ensure that your baby is  properly fed:  Newborns (babies 4 weeks of age or younger) may breastfeed every 1-3 hours.  Newborns should not go longer than 3 hours during the day or 5 hours during the night without breastfeeding.  You should breastfeed your baby a minimum of 8 times in a 24-hour period until you begin to introduce solid foods to your baby at around 6 months of age. BREAST MILK PUMPING Pumping and storing breast milk allows you to ensure that your baby is exclusively fed your breast milk, even at times when you are unable to breastfeed. This is especially important if you are going back to work while you are still breastfeeding or when you are not able to be present during feedings. Your lactation consultant can give you guidelines on how long it is safe to store breast milk. A breast pump is a machine that allows you to pump milk from your breast into a sterile bottle. The pumped breast milk can then be stored in a refrigerator or freezer. Some breast pumps are operated by hand, while others use electricity. Ask your lactation consultant which type will work best for you. Breast pumps can be purchased, but some hospitals and breastfeeding support groups lease breast pumps on a monthly basis. A lactation consultant can teach you how to hand express breast milk, if you prefer not to use a pump. CARING FOR YOUR BREASTS WHILE YOU BREASTFEED Nipples can become dry, cracked, and sore while breastfeeding. The following recommendations can help keep your breasts moisturized and healthy:  Avoid using soap on your nipples.  Wear a supportive bra. Although not required, special nursing bras and tank tops are designed to allow access to your   breasts for breastfeeding without taking off your entire bra or top. Avoid wearing underwire-style bras or extremely tight bras.  Air dry your nipples for 3-4minutes after each feeding.  Use only cotton bra pads to absorb leaked breast milk. Leaking of breast milk between feedings  is normal.  Use lanolin on your nipples after breastfeeding. Lanolin helps to maintain your skin's normal moisture barrier. If you use pure lanolin, you do not need to wash it off before feeding your baby again. Pure lanolin is not toxic to your baby. You may also hand express a few drops of breast milk and gently massage that milk into your nipples and allow the milk to air dry. In the first few weeks after giving birth, some women experience extremely full breasts (engorgement). Engorgement can make your breasts feel heavy, warm, and tender to the touch. Engorgement peaks within 3-5 days after you give birth. The following recommendations can help ease engorgement:  Completely empty your breasts while breastfeeding or pumping. You may want to start by applying warm, moist heat (in the shower or with warm water-soaked hand towels) just before feeding or pumping. This increases circulation and helps the milk flow. If your baby does not completely empty your breasts while breastfeeding, pump any extra milk after he or she is finished.  Wear a snug bra (nursing or regular) or tank top for 1-2 days to signal your body to slightly decrease milk production.  Apply ice packs to your breasts, unless this is too uncomfortable for you.  Make sure that your baby is latched on and positioned properly while breastfeeding. If engorgement persists after 48 hours of following these recommendations, contact your health care provider or a lactation consultant. OVERALL HEALTH CARE RECOMMENDATIONS WHILE BREASTFEEDING  Eat healthy foods. Alternate between meals and snacks, eating 3 of each per day. Because what you eat affects your breast milk, some of the foods may make your baby more irritable than usual. Avoid eating these foods if you are sure that they are negatively affecting your baby.  Drink milk, fruit juice, and water to satisfy your thirst (about 10 glasses a day).  Rest often, relax, and continue to take  your prenatal vitamins to prevent fatigue, stress, and anemia.  Continue breast self-awareness checks.  Avoid chewing and smoking tobacco. Chemicals from cigarettes that pass into breast milk and exposure to secondhand smoke may harm your baby.  Avoid alcohol and drug use, including marijuana. Some medicines that may be harmful to your baby can pass through breast milk. It is important to ask your health care provider before taking any medicine, including all over-the-counter and prescription medicine as well as vitamin and herbal supplements. It is possible to become pregnant while breastfeeding. If birth control is desired, ask your health care provider about options that will be safe for your baby. SEEK MEDICAL CARE IF:  You feel like you want to stop breastfeeding or have become frustrated with breastfeeding.  You have painful breasts or nipples.  Your nipples are cracked or bleeding.  Your breasts are red, tender, or warm.  You have a swollen area on either breast.  You have a fever or chills.  You have nausea or vomiting.  You have drainage other than breast milk from your nipples.  Your breasts do not become full before feedings by the fifth day after you give birth.  You feel sad and depressed.  Your baby is too sleepy to eat well.  Your baby is having trouble sleeping.     Your baby is wetting less than 3 diapers in a 24-hour period.  Your baby has less than 3 stools in a 24-hour period.  Your baby's skin or the white part of his or her eyes becomes yellow.   Your baby is not gaining weight by 5 days of age. SEEK IMMEDIATE MEDICAL CARE IF:  Your baby is overly tired (lethargic) and does not want to wake up and feed.  Your baby develops an unexplained fever.   This information is not intended to replace advice given to you by your health care provider. Make sure you discuss any questions you have with your health care provider.   Document Released: 06/14/2005  Document Revised: 03/05/2015 Document Reviewed: 12/06/2012 Elsevier Interactive Patient Education 2016 [employer]  

## 2016-04-12 NOTE — Progress Notes (Signed)
   PRENATAL VISIT NOTE  Subjective:  [name] is a 25 y.o. G4P1021 at 9476w4d being seen today for ongoing prenatal care.  She is currently monitored for the following issues for this low-risk pregnancy and has Supervision of normal pregnancy in second trimester; BMI 33.0-33.9,adult; and Obesity in pregnancy on her problem list.  Patient reports no complaints.  Contractions: Not present. Vag. Bleeding: None.  Movement: Present. Denies leaking of fluid.   The following portions of the patient's history were reviewed and updated as appropriate: allergies, current medications, past family history, past medical history, past social history, past surgical history and problem list. Problem list updated.  Objective:   Vitals:   04/12/16 1324  BP: 119/69  Pulse: 93  Weight: 183 lb (83 kg)    Fetal Status: Fetal Heart Rate (bpm): 136   Movement: Present     General:  Alert, oriented and cooperative. Patient is in no acute distress.  Skin: Skin is warm and dry. No rash noted.   Cardiovascular: Normal heart rate noted  Respiratory: Normal respiratory effort, no problems with respiration noted  Abdomen: Soft, gravid, appropriate for gestational age. Pain/Pressure: Absent     Pelvic:  Cervical exam deferred        Extremities: Normal range of motion.  Edema: Trace  Mental Status: Normal mood and affect. Normal behavior. Normal judgment and thought content.   Assessment and Plan:  Pregnancy: G4P1021 at 5576w4d  1. Encounter for supervision of other normal pregnancy in second trimester Continue routine prenatal care.   General obstetric precautions including but not limited to vaginal bleeding, contractions, leaking of fluid and fetal movement were reviewed in detail with the patient. Please refer to After Visit Summary for other counseling recommendations.  Return in 4 weeks (on 05/10/2016).  Reva Boresanya S Ashlee Bewley, MD

## 2016-04-19 ENCOUNTER — Encounter: Payer: Self-pay | Admitting: Obstetrics and Gynecology

## 2016-04-19 ENCOUNTER — Ambulatory Visit (HOSPITAL_COMMUNITY)
Admission: RE | Admit: 2016-04-19 | Discharge: 2016-04-19 | Disposition: A | Discharge status: Home / Self Care | Payer: Medicaid Other | Source: Ambulatory Visit | Attending: Obstetrics and Gynecology | Admitting: Obstetrics and Gynecology

## 2016-04-19 DIAGNOSIS — Z363 Encounter for antenatal screening for malformations: Secondary | ICD-10-CM | POA: Diagnosis present

## 2016-04-19 DIAGNOSIS — O99210 Obesity complicating pregnancy, unspecified trimester: Secondary | ICD-10-CM

## 2016-04-19 DIAGNOSIS — O35EXX0 Maternal care for other (suspected) fetal abnormality and damage, fetal genitourinary anomalies, not applicable or unspecified: Secondary | ICD-10-CM | POA: Insufficient documentation

## 2016-04-19 DIAGNOSIS — O358XX0 Maternal care for other (suspected) fetal abnormality and damage, not applicable or unspecified: Secondary | ICD-10-CM | POA: Insufficient documentation

## 2016-04-19 DIAGNOSIS — Z3A20 20 weeks gestation of pregnancy: Secondary | ICD-10-CM | POA: Insufficient documentation

## 2016-04-21 ENCOUNTER — Other Ambulatory Visit (INDEPENDENT_AMBULATORY_CARE_PROVIDER_SITE_OTHER): Payer: Medicaid Other | Admitting: *Deleted

## 2016-04-21 DIAGNOSIS — Z3492 Encounter for supervision of normal pregnancy, unspecified, second trimester: Secondary | ICD-10-CM | POA: Diagnosis not present

## 2016-04-21 NOTE — Progress Notes (Signed)
Spoke to pt, coming in today at 3:30 for AFP.

## 2016-04-21 NOTE — Progress Notes (Signed)
Pt is here today for quad screen due to bilateral pyelectasis noted on anatomy US, Quad screen drawn and sent to lab.

## 2016-04-21 NOTE — Progress Notes (Signed)
Yes I spoke to him about it, thanks.

## 2016-04-23 LAB — AFP, QUAD SCREEN
AFP: 77.4 ng/mL
Curr Gest Age: 20.9 weeks
Down Syndrome Scr Risk Est: <1:38500 {titer}
HCG TOTAL: 11.58 [IU]/mL
INH: 110.9 pg/mL
INTERPRETATION-AFP: NEGATIVE
MOM FOR AFP: 1.22
MoM for INH: 0.64
MoM for hCG: 0.65
OPEN SPINA BIFIDA: NEGATIVE
Tri 18 Scr Risk Est: NEGATIVE
Trisomy 18 (Edward) Syndrome Interp.: 1:57600 {titer}
UE3 MOM: 1.29
UE3 VALUE: 2.58 ng/mL

## 2016-04-28 ENCOUNTER — Encounter: Payer: Self-pay | Admitting: *Deleted

## 2016-05-04 ENCOUNTER — Inpatient Hospital Stay (HOSPITAL_COMMUNITY)
Admission: AD | Admit: 2016-05-04 | Discharge: 2016-05-04 | Disposition: A | Discharge status: Home / Self Care | Payer: Medicaid Other | Source: Ambulatory Visit | Attending: Family Medicine | Admitting: Family Medicine

## 2016-05-04 ENCOUNTER — Encounter (HOSPITAL_COMMUNITY): Payer: Self-pay

## 2016-05-04 DIAGNOSIS — R109 Unspecified abdominal pain: Secondary | ICD-10-CM | POA: Diagnosis present

## 2016-05-04 DIAGNOSIS — O26899 Other specified pregnancy related conditions, unspecified trimester: Secondary | ICD-10-CM

## 2016-05-04 DIAGNOSIS — Z87891 Personal history of nicotine dependence: Secondary | ICD-10-CM | POA: Diagnosis not present

## 2016-05-04 DIAGNOSIS — O26892 Other specified pregnancy related conditions, second trimester: Secondary | ICD-10-CM | POA: Insufficient documentation

## 2016-05-04 DIAGNOSIS — R102 Pelvic and perineal pain: Secondary | ICD-10-CM | POA: Insufficient documentation

## 2016-05-04 DIAGNOSIS — Z3A22 22 weeks gestation of pregnancy: Secondary | ICD-10-CM | POA: Diagnosis not present

## 2016-05-04 LAB — URINALYSIS, ROUTINE W REFLEX MICROSCOPIC
Bilirubin Urine: NEGATIVE
Glucose, UA: NEGATIVE mg/dL
Hgb urine dipstick: NEGATIVE
KETONES UR: NEGATIVE mg/dL
LEUKOCYTES UA: NEGATIVE
NITRITE: NEGATIVE
PH: 5.5 (ref 5.0–8.0)
PROTEIN: NEGATIVE mg/dL
Specific Gravity, Urine: 1.015 (ref 1.005–1.030)

## 2016-05-04 MED ORDER — COMFORT FIT MATERNITY SUPP SM MISC: 1.0000 [IU] | Freq: Every day | 0 refills | Status: DC | PRN

## 2016-05-04 NOTE — MAU Provider Note (Signed)
History     CSN: [id number]  Arrival date and time: 05/04/16 0901   First Provider Initiated Contact with Patient 05/04/16 1011      Chief Complaint  Patient presents with  . Abdominal Pain   HPI [name] is a 25 y.o. G4P1021 at 1329w5d who presents with abdominal pain. Symptoms began 1 week ago. Reports lower abdominal pain that is sharp and intermittent. Pain worse with walking and position changes. Rates pain 8/10. Has been taking tylenol with minimal relief. Denies n/v/d, constipation, dysuria, vaginal bleeding, or LOF.   OB History    Gravida Para Term Preterm AB Living   4 1 1  0 2 1   SAB TAB Ectopic Multiple Live Births   1 0 0 0 1      Past Medical History:  Diagnosis Date  . BV (bacterial vaginosis)   . Trichomonas     Past Surgical History:  Procedure Laterality Date  . DILATION AND CURETTAGE OF UTERUS      Family History  Problem Relation Age of Onset  . Cancer Brother   . Hypertension Maternal Grandfather   . Diabetes Brother   . Heart disease Neg Hx     Social History  Substance Use Topics  . Smoking status: Former Smoker    Quit date: 04/02/2013  . Smokeless tobacco: Former NeurosurgeonUser  . Alcohol use No     Comment: occasionally     Allergies:  Allergies  Allergen Reactions  . Latex Rash    In vaginal area  . Sulfa Antibiotics Hives and Rash    Prescriptions Prior to Admission  Medication Sig Dispense Refill Last Dose  . acetaminophen (TYLENOL) 500 MG tablet Take 1,000 mg by mouth every 6 (six) hours as needed for mild pain or headache.   Taking  . Prenatal Vit-Fe Fumarate-FA (PRENATAL MULTIVITAMIN) TABS tablet Take 1 tablet by mouth daily at 12 noon.   Taking    Review of Systems  Constitutional: Negative.   Gastrointestinal: Positive for abdominal pain. Negative for constipation, diarrhea, nausea and vomiting.  Genitourinary: Negative.    Physical Exam   Blood pressure 132/74, pulse 100, temperature 98.1 F (36.7 C), temperature  source Oral, resp. rate 18.  Physical Exam  Nursing note and vitals reviewed. Constitutional: She is oriented to person, place, and time. She appears well-developed and well-nourished. No distress.  HENT:  Head: Normocephalic and atraumatic.  Eyes: Conjunctivae are normal. Right eye exhibits no discharge. Left eye exhibits no discharge. No scleral icterus.  Neck: Normal range of motion.  Cardiovascular: Normal rate, regular rhythm and normal heart sounds.   No murmur heard. Respiratory: Effort normal and breath sounds normal. No respiratory distress. She has no wheezes.  GI: Soft. There is no tenderness.  Genitourinary:  Genitourinary Comments: Cervix closed  Neurological: She is alert and oriented to person, place, and time.  Skin: Skin is warm and dry. She is not diaphoretic.  Psychiatric: She has a normal mood and affect. Her behavior is normal. Judgment and thought content normal.    MAU Course  Procedures Results for orders placed or performed during the hospital encounter of 05/04/16 (from the past 24 hour(s))  Urinalysis, Routine w reflex microscopic (not at [facility])     Status: None   Collection Time: 05/04/16  9:11 AM  Result Value Ref Range   Color, Urine YELLOW YELLOW   APPearance CLEAR CLEAR   Specific Gravity, Urine 1.015 1.005 - 1.030   pH 5.5 5.0 -  8.0   Glucose, UA NEGATIVE NEGATIVE mg/dL   Hgb urine dipstick NEGATIVE NEGATIVE   Bilirubin Urine NEGATIVE NEGATIVE   Ketones, ur NEGATIVE NEGATIVE mg/dL   Protein, ur NEGATIVE NEGATIVE mg/dL   Nitrite NEGATIVE NEGATIVE   Leukocytes, UA NEGATIVE NEGATIVE    MDM FHT 146 by doppler Cervix closed U/a negative  Assessment and Plan  A: 1. Pain of round ligament during pregnancy    P: Discharge home rx maternity support belt Slow position changes Keep f/u with ob Discussed reasons to return to MAU  Judeth HornErin Natalynn Pedone 05/04/2016, 10:11 AM

## 2016-05-04 NOTE — Discharge Instructions (Signed)
Round Ligament Pain  The round ligament is a cord of muscle and tissue that helps to support the uterus. It can become a source of pain during pregnancy if it becomes stretched or twisted as the baby grows. The pain usually begins in the second trimester of pregnancy, and it can come and go until the baby is delivered. It is not a serious problem, and it does not cause harm to the baby.  Round ligament pain is usually a short, sharp, and pinching pain, but it can also be a dull, lingering, and aching pain. The pain is felt in the lower side of the abdomen or in the groin. It usually starts deep in the groin and moves up to the outside of the hip area. Pain can occur with:   A sudden change in position.   Rolling over in bed.   Coughing or sneezing.   Physical activity.  HOME CARE INSTRUCTIONS  Watch your condition for any changes. Take these steps to help with your pain:   When the pain starts, relax. Then try:    Sitting down.    Flexing your knees up to your abdomen.    Lying on your side with one pillow under your abdomen and another pillow between your legs.    Sitting in a warm bath for 15-20 minutes or until the pain goes away.   Take over-the-counter and prescription medicines only as told by your health care provider.   Move slowly when you sit and stand.   Avoid long walks if they cause pain.   Stop or lessen your physical activities if they cause pain.  SEEK MEDICAL CARE IF:   Your pain does not go away with treatment.   You feel pain in your back that you did not have before.   Your medicine is not helping.  SEEK IMMEDIATE MEDICAL CARE IF:   You develop a fever or chills.   You develop uterine contractions.   You develop vaginal bleeding.   You develop nausea or vomiting.   You develop diarrhea.   You have pain when you urinate.     This information is not intended to replace advice given to you by your health care provider. Make sure you discuss any questions you have with your health  care provider.     Document Released: 03/23/2008 Document Revised: 09/06/2011 Document Reviewed: 08/21/2014  Elsevier Interactive Patient Education 2016 Elsevier Inc.

## 2016-05-04 NOTE — MAU Note (Signed)
Pt presents to MAU with sharp lower abdominal pain. Pain began about a week ago, pain is with movement. Taking Tylenol for pain, with relief. Pt noted orange spotting about 2-3 days ago when she wiped and has since stopped.

## 2016-05-10 ENCOUNTER — Ambulatory Visit (INDEPENDENT_AMBULATORY_CARE_PROVIDER_SITE_OTHER): Payer: Medicaid Other | Admitting: Obstetrics & Gynecology

## 2016-05-10 VITALS — BP 104/65 | HR 99 | Wt 186.0 lb

## 2016-05-10 DIAGNOSIS — O99210 Obesity complicating pregnancy, unspecified trimester: Secondary | ICD-10-CM

## 2016-05-10 DIAGNOSIS — O99212 Obesity complicating pregnancy, second trimester: Secondary | ICD-10-CM

## 2016-05-10 DIAGNOSIS — O358XX0 Maternal care for other (suspected) fetal abnormality and damage, not applicable or unspecified: Secondary | ICD-10-CM

## 2016-05-10 DIAGNOSIS — E669 Obesity, unspecified: Secondary | ICD-10-CM

## 2016-05-10 DIAGNOSIS — O35EXX0 Maternal care for other (suspected) fetal abnormality and damage, fetal genitourinary anomalies, not applicable or unspecified: Secondary | ICD-10-CM

## 2016-05-10 DIAGNOSIS — Z3482 Encounter for supervision of other normal pregnancy, second trimester: Secondary | ICD-10-CM

## 2016-05-10 NOTE — Progress Notes (Signed)
   PRENATAL VISIT NOTE  Subjective:  [name] is a 25 y.o. AA [id number] at 540w4d being seen today for ongoing prenatal care.  She is currently monitored for the following issues for this low-risk pregnancy and has Supervision of normal pregnancy in second trimester; BMI 33.0-33.9,adult; Obesity in pregnancy; and bilateral fetal pyelectasis on her problem list.  Patient reports no complaints.  Contractions: Not present. Vag. Bleeding: None.  Movement: Present. Denies leaking of fluid.   The following portions of the patient's history were reviewed and updated as appropriate: allergies, current medications, past family history, past medical history, past social history, past surgical history and problem list. Problem list updated.  Objective:   Vitals:   05/10/16 1507  BP: 104/65  Pulse: 99  Weight: 186 lb (84.4 kg)    Fetal Status: Fetal Heart Rate (bpm): 138   Movement: Present     General:  Alert, oriented and cooperative. Patient is in no acute distress.  Skin: Skin is warm and dry. No rash noted.   Cardiovascular: Normal heart rate noted  Respiratory: Normal respiratory effort, no problems with respiration noted  Abdomen: Soft, gravid, appropriate for gestational age. Pain/Pressure: Absent     Pelvic:  Cervical exam deferred        Extremities: Normal range of motion.  Edema: None  Mental Status: Normal mood and affect. Normal behavior. Normal judgment and thought content.   Assessment and Plan:  Pregnancy: G4P1021 at 1740w4d  1. Encounter for repeat ultrasound of fetal pyelectasis, antepartum, single or unspecified fetus  - US MFM OB FOLLOW UP; Future  2. Encounter for supervision of other normal pregnancy in second trimester   3. Obesity in pregnancy - TDAP, 2 hour GTT at next visit  Preterm labor symptoms and general obstetric precautions including but not limited to vaginal bleeding, contractions, leaking of fluid and fetal movement were reviewed in detail with the  patient. Please refer to After Visit Summary for other counseling recommendations.  No Follow-up on file.   Allie BossierMyra C Finnley Lewis, MD

## 2016-05-11 ENCOUNTER — Ambulatory Visit (HOSPITAL_COMMUNITY)
Admission: RE | Admit: 2016-05-11 | Discharge: 2016-05-11 | Disposition: A | Discharge status: Home / Self Care | Payer: Medicaid Other | Source: Ambulatory Visit | Attending: Obstetrics & Gynecology | Admitting: Obstetrics & Gynecology

## 2016-05-11 ENCOUNTER — Other Ambulatory Visit: Payer: Self-pay | Admitting: Obstetrics & Gynecology

## 2016-05-11 DIAGNOSIS — O35EXX0 Maternal care for other (suspected) fetal abnormality and damage, fetal genitourinary anomalies, not applicable or unspecified: Secondary | ICD-10-CM

## 2016-05-11 DIAGNOSIS — O283 Abnormal ultrasonic finding on antenatal screening of mother: Secondary | ICD-10-CM | POA: Diagnosis not present

## 2016-05-11 DIAGNOSIS — Z3A23 23 weeks gestation of pregnancy: Secondary | ICD-10-CM | POA: Insufficient documentation

## 2016-05-11 DIAGNOSIS — O358XX0 Maternal care for other (suspected) fetal abnormality and damage, not applicable or unspecified: Secondary | ICD-10-CM

## 2016-05-11 DIAGNOSIS — Z362 Encounter for other antenatal screening follow-up: Secondary | ICD-10-CM | POA: Diagnosis not present

## 2016-05-12 ENCOUNTER — Other Ambulatory Visit (HOSPITAL_COMMUNITY): Payer: Self-pay | Admitting: *Deleted

## 2016-05-12 DIAGNOSIS — O35EXX0 Maternal care for other (suspected) fetal abnormality and damage, fetal genitourinary anomalies, not applicable or unspecified: Secondary | ICD-10-CM

## 2016-05-12 DIAGNOSIS — O358XX0 Maternal care for other (suspected) fetal abnormality and damage, not applicable or unspecified: Secondary | ICD-10-CM

## 2016-05-12 DIAGNOSIS — O26879 Cervical shortening, unspecified trimester: Secondary | ICD-10-CM

## 2016-05-19 ENCOUNTER — Inpatient Hospital Stay (HOSPITAL_COMMUNITY): Payer: Medicaid Other

## 2016-05-19 ENCOUNTER — Inpatient Hospital Stay (HOSPITAL_COMMUNITY)
Admission: AD | Admit: 2016-05-19 | Discharge: 2016-05-19 | Disposition: A | Discharge status: Home / Self Care | Payer: Medicaid Other | Source: Ambulatory Visit | Attending: Family Medicine | Admitting: Family Medicine

## 2016-05-19 ENCOUNTER — Encounter (HOSPITAL_COMMUNITY): Payer: Self-pay | Admitting: *Deleted

## 2016-05-19 DIAGNOSIS — M549 Dorsalgia, unspecified: Secondary | ICD-10-CM

## 2016-05-19 DIAGNOSIS — N949 Unspecified condition associated with female genital organs and menstrual cycle: Secondary | ICD-10-CM

## 2016-05-19 DIAGNOSIS — O26892 Other specified pregnancy related conditions, second trimester: Secondary | ICD-10-CM | POA: Insufficient documentation

## 2016-05-19 DIAGNOSIS — R102 Pelvic and perineal pain: Secondary | ICD-10-CM | POA: Diagnosis present

## 2016-05-19 DIAGNOSIS — O26872 Cervical shortening, second trimester: Secondary | ICD-10-CM | POA: Diagnosis not present

## 2016-05-19 DIAGNOSIS — Z3A24 24 weeks gestation of pregnancy: Secondary | ICD-10-CM | POA: Diagnosis not present

## 2016-05-19 DIAGNOSIS — O9989 Other specified diseases and conditions complicating pregnancy, childbirth and the puerperium: Secondary | ICD-10-CM

## 2016-05-19 DIAGNOSIS — O26879 Cervical shortening, unspecified trimester: Secondary | ICD-10-CM

## 2016-05-19 DIAGNOSIS — O99891 Other specified diseases and conditions complicating pregnancy: Secondary | ICD-10-CM

## 2016-05-19 LAB — URINALYSIS, ROUTINE W REFLEX MICROSCOPIC
Bilirubin Urine: NEGATIVE
Glucose, UA: NEGATIVE mg/dL
HGB URINE DIPSTICK: NEGATIVE
Ketones, ur: NEGATIVE mg/dL
LEUKOCYTES UA: NEGATIVE
NITRITE: NEGATIVE
PROTEIN: NEGATIVE mg/dL
Specific Gravity, Urine: 1.02 (ref 1.005–1.030)
pH: 6 (ref 5.0–8.0)

## 2016-05-19 NOTE — Discharge Instructions (Signed)
Back Pain in Pregnancy Introduction Back pain during pregnancy is common. Back pain may be caused by several factors that are related to changes during your pregnancy. Follow these instructions at home: Managing pain, stiffness, and swelling  If directed, apply ice for sudden (acute) back pain.  Put ice in a plastic bag.  Place a towel between your skin and the bag.  Leave the ice on for 20 minutes, 2-3 times per day.  If directed, apply heat to the affected area before you exercise:  Place a towel between your skin and the heat pack or heating pad.  Leave the heat on for 20-30 minutes.  Remove the heat if your skin turns bright red. This is especially important if you are unable to feel pain, heat, or cold. You may have a greater risk of getting burned. Activity  Exercise as told by your health care provider. Exercising is the best way to prevent or manage back pain.  Listen to your body when lifting. If lifting hurts, ask for help or bend your knees. This uses your leg muscles instead of your back muscles.  Squat down when picking up something from the floor. Do not bend over.  Only use bed rest as told by your health care provider. Bed rest should only be used for the most severe episodes of back pain. Standing, Sitting, and Lying Down  Do not stand in one place for long periods of time.  Use good posture when sitting. Make sure your head rests over your shoulders and is not hanging forward. Use a pillow on your lower back if necessary.  Try sleeping on your side, preferably the left side, with a pillow or two between your legs. If you are sore after a night's rest, your bed may be too soft. A firm mattress may provide more support for your back during pregnancy. General instructions  Do not wear high heels.  Eat a healthy diet. Try to gain weight within your health care provider's recommendations.  Use a maternity girdle, elastic sling, or back brace as told by your  health care provider.  Take over-the-counter and prescription medicines only as told by your health care provider.  Keep all follow-up visits as told by your health care provider. This is important. This includes any visits with any specialists, such as a physical therapist. Contact a health care provider if:  Your back pain interferes with your daily activities.  You have increasing pain in other parts of your body. Get help right away if:  You develop numbness, tingling, weakness, or problems with the use of your arms or legs.  You develop severe back pain that is not controlled with medicine.  You have a sudden change in bowel or bladder control.  You develop shortness of breath, dizziness, or you faint.  You develop nausea, vomiting, or sweating.  You have back pain that is a rhythmic, cramping pain similar to labor pains. Labor pain is usually 1-2 minutes apart, lasts for about 1 minute, and involves a bearing down feeling or pressure in your pelvis.  You have back pain and your water breaks or you have vaginal bleeding.  You have back pain or numbness that travels down your leg.  Your back pain developed after you fell.  You develop pain on one side of your back.  You see blood in your urine.  You develop skin blisters in the area of your back pain. This information is not intended to replace advice given to  you by your health care provider. Make sure you discuss any questions you have with your health care provider. Document Released: 09/22/2005 Document Revised: 11/20/2015 Document Reviewed: 02/26/2015  2017 Elsevier   Abdominal Pain During Pregnancy Abdominal pain is common in pregnancy. Most of the time, it does not cause harm. There are many causes of abdominal pain. Some causes are more serious than others and sometimes the cause is not known. Abdominal pain can be a sign that something is very wrong with the pregnancy or the pain may have nothing to do with the  pregnancy. Always tell your health care provider if you have any abdominal pain. Follow these instructions at home:  Do not have sex or put anything in your vagina until your symptoms go away completely.  Watch your abdominal pain for any changes.  Get plenty of rest until your pain improves.  Drink enough fluid to keep your urine clear or pale yellow.  Take over-the-counter or prescription medicines only as told by your health care provider.  Keep all follow-up visits as told by your health care provider. This is important. Contact a health care provider if:  You have a fever.  Your pain gets worse or you have cramping.  Your pain continues after resting. Get help right away if:  You are bleeding, leaking fluid, or passing tissue from the vagina.  You have vomiting or diarrhea that does not go away.  You have painful or bloody urination.  You notice a decrease in your baby's movements.  You feel very weak or faint.  You have shortness of breath.  You develop a severe headache with abdominal pain.  You have abnormal vaginal discharge with abdominal pain. This information is not intended to replace advice given to you by your health care provider. Make sure you discuss any questions you have with your health care provider. Document Released: 06/14/2005 Document Revised: 03/25/2016 Document Reviewed: 01/11/2013 Elsevier Interactive Patient Education  2017 [employer]

## 2016-05-19 NOTE — MAU Note (Signed)
Was told to come in if she felt pressure, has short cervix.  Feeling increased pelvic and vaginal pressure.  Pain in low back

## 2016-05-19 NOTE — MAU Provider Note (Signed)
Chief Complaint:  pelvic pressure and Back Pain   None     HPI: [name] is a 25 y.o. G4P1021 at 824w6dwho presents to maternity admissions reporting onset of constant lower pelvic pain and intermittent back pain this morning. Her pain is dull, moderate to severe in intensity, and unchanged over time.  She reports her recent US showed shortened cervix and she was prescribed vaginal progesterone. She read about the medications and is concerned about side effects so has not started the medicine.  She has not tried any treatments for her pain but was worried because of her shortened cervix so came in for evaluation.  She reports good fetal movement, denies LOF, vaginal bleeding, vaginal itching/burning, urinary symptoms, h/a, dizziness, n/v, or fever/chills.    HPI  Past Medical History: Past Medical History:  Diagnosis Date  . BV (bacterial vaginosis)   . Medical history non-contributory   . Trichomonas     Past obstetric history: OB History  Gravida Para Term Preterm AB Living  4 1 1  0 2 1  SAB TAB Ectopic Multiple Live Births  1 0 0 0 1    # Outcome Date GA Lbr Len/2nd Weight Sex Delivery Anes PTL Lv  4 Current           3 Term 12/06/13 832w2d 10:43 / 00:44 7 lb 0.2 oz (3.181 kg) M Vag-Spont Local, EPI  LIV  2 AB 06/28/09 2911w0d         1 SAB               Past Surgical History: Past Surgical History:  Procedure Laterality Date  . DILATION AND CURETTAGE OF UTERUS      Family History: Family History  Problem Relation Age of Onset  . Cancer Brother   . Hypertension Maternal Grandfather   . Diabetes Brother   . Heart disease Neg Hx     Social History: Social History  Substance Use Topics  . Smoking status: Former Smoker    Quit date: 04/02/2013  . Smokeless tobacco: Former NeurosurgeonUser  . Alcohol use No     Comment: occasionally     Allergies:  Allergies  Allergen Reactions  . Latex Rash  . Sulfa Antibiotics Hives    Meds:  Prescriptions Prior to Admission   Medication Sig Dispense Refill Last Dose  . acetaminophen (TYLENOL) 500 MG tablet Take 1,000 mg by mouth every 6 (six) hours as needed for mild pain, moderate pain or headache.    05/19/2016 at 1100  . Prenat-FeAsp-Meth-FA-DHA w/o A (PRENATE PIXIE) 10-0.6-0.4-200 MG CAPS Take 1 capsule by mouth at bedtime.   05/18/2016 at Unknown time    ROS:  Review of Systems  Constitutional: Negative for chills, fatigue and fever.  Respiratory: Negative for shortness of breath.   Cardiovascular: Negative for chest pain.  Gastrointestinal: Positive for abdominal pain.  Genitourinary: Positive for pelvic pain. Negative for difficulty urinating, dysuria, flank pain, vaginal bleeding, vaginal discharge and vaginal pain.  Neurological: Negative for dizziness and headaches.  Psychiatric/Behavioral: Negative.      I have reviewed patient's Past Medical Hx, Surgical Hx, Family Hx, Social Hx, medications and allergies.   Physical Exam  Patient Vitals for the past 24 hrs:  BP Temp Temp src Pulse Resp Height Weight  05/19/16 1207 110/61 98.1 F (36.7 C) Oral 89 16 5\' 2"  (1.575 m) 186 lb 9.6 oz (84.6 kg)   Constitutional: Well-developed, well-nourished female in no acute distress.  Cardiovascular: normal rate Respiratory:  normal effort GI: Abd soft, non-tender, gravid appropriate for gestational age.  MS: Extremities nontender, no edema, normal ROM Neurologic: Alert and oriented x 4.  GU: Neg CVAT.  Dilation: Closed Effacement (%): 50 Cervical Position: Posterior Presentation: Vertex Exam by:: L Leftwich-KIrby NP  FHT:  Baseline 135 , moderate variability, accelerations present, no decelerations Contractions: occasional, mild to palpation   Labs: Results for orders placed or performed during the hospital encounter of 05/19/16 (from the past 24 hour(s))  Urinalysis, Routine w reflex microscopic (not at [facility])     Status: None   Collection Time: 05/19/16 12:28 PM  Result Value Ref Range   Color,  Urine YELLOW YELLOW   APPearance CLEAR CLEAR   Specific Gravity, Urine 1.020 1.005 - 1.030   pH 6.0 5.0 - 8.0   Glucose, UA NEGATIVE NEGATIVE mg/dL   Hgb urine dipstick NEGATIVE NEGATIVE   Bilirubin Urine NEGATIVE NEGATIVE   Ketones, ur NEGATIVE NEGATIVE mg/dL   Protein, ur NEGATIVE NEGATIVE mg/dL   Nitrite NEGATIVE NEGATIVE   Leukocytes, UA NEGATIVE NEGATIVE   A/POS/-- (09/18 1449)  Imaging:  Transvaginal OB US in MFM: Cervix 2.5 cm in length today  MAU Course/MDM: I have ordered labs and reviewed results.  NST reviewed Consult Dr Alvester MorinNewton with presentation, exam findings and test results.  No evidence of preterm labor today, cervix rechecked after 2 hours in MAU.  Cervix 2.5 cm today, 1.9 cm measured on 11/14.  Pt to start vaginal progesterone as previously prescribed. Recommend increased PO fluids/rest/ice/heat/pregnancy support belt/Tylenol for pain.   Reviewed preterm labor signs/reasons to return to hospital U/A normal but urine sent for culture  [facility] Pt stable at time of discharge.  Today's evaluation included a work-up for preterm labor which can be life-threatening for both mom and baby.  Assessment: 1. Cervical shortening affecting pregnancy in second trimester   2. Cervical shortening complicating pregnancy   3. Back pain affecting pregnancy in second trimester   4. Pelvic pressure in pregnancy, antepartum, second trimester     Plan: Discharge home Labor precautions and fetal kick counts  Follow-up Information    [facility].   Specialty:  Obstetrics and Gynecology Why:  As scheduled, return to MAU as needed for emergencies Contact information: [address] [place] [phone]           Medication List    TAKE these medications   acetaminophen 500 MG tablet Commonly known as:  TYLENOL Take 1,000 mg by mouth every 6 (six) hours  as needed for mild pain, moderate pain or headache.   PRENATE PIXIE 10-0.6-0.4-200 MG Caps Take 1 capsule by mouth at bedtime.       Sharen CounterLisa Leftwich-Kirby Certified Nurse-Midwife 05/19/2016 3:49 PM

## 2016-05-21 LAB — CULTURE, OB URINE

## 2016-05-25 ENCOUNTER — Other Ambulatory Visit (HOSPITAL_COMMUNITY): Payer: Self-pay | Admitting: Maternal and Fetal Medicine

## 2016-05-25 ENCOUNTER — Ambulatory Visit (HOSPITAL_COMMUNITY)
Admission: RE | Admit: 2016-05-25 | Discharge: 2016-05-25 | Disposition: A | Discharge status: Home / Self Care | Payer: Medicaid Other | Source: Ambulatory Visit | Attending: Obstetrics & Gynecology | Admitting: Obstetrics & Gynecology

## 2016-05-25 ENCOUNTER — Encounter (HOSPITAL_COMMUNITY): Payer: Self-pay

## 2016-05-25 DIAGNOSIS — Z3A25 25 weeks gestation of pregnancy: Secondary | ICD-10-CM | POA: Diagnosis present

## 2016-05-25 DIAGNOSIS — O359XX0 Maternal care for (suspected) fetal abnormality and damage, unspecified, not applicable or unspecified: Secondary | ICD-10-CM | POA: Diagnosis not present

## 2016-05-25 DIAGNOSIS — O26879 Cervical shortening, unspecified trimester: Secondary | ICD-10-CM | POA: Diagnosis not present

## 2016-05-25 DIAGNOSIS — O283 Abnormal ultrasonic finding on antenatal screening of mother: Secondary | ICD-10-CM | POA: Insufficient documentation

## 2016-05-25 DIAGNOSIS — O26872 Cervical shortening, second trimester: Secondary | ICD-10-CM

## 2016-05-25 NOTE — Addendum Note (Signed)
Encounter addended by: Vivien Rotaachael H Mayo Owczarzak, RT on: 05/25/2016  4:37 PM<BR>    Actions taken: Imaging Exam ended

## 2016-05-25 NOTE — Addendum Note (Signed)
Encounter addended by: Findley Blankenbaker H Francoise Chojnowski, RT on: 05/25/2016  4:37 PM<BR>    Actions taken: Imaging Exam ended

## 2016-06-08 ENCOUNTER — Ambulatory Visit (HOSPITAL_COMMUNITY)
Admission: RE | Admit: 2016-06-08 | Discharge: 2016-06-08 | Disposition: A | Discharge status: Home / Self Care | Payer: Medicaid Other | Source: Ambulatory Visit | Attending: Obstetrics & Gynecology | Admitting: Obstetrics & Gynecology

## 2016-06-08 ENCOUNTER — Encounter (HOSPITAL_COMMUNITY): Payer: Self-pay

## 2016-06-08 DIAGNOSIS — O26872 Cervical shortening, second trimester: Secondary | ICD-10-CM

## 2016-06-08 DIAGNOSIS — Z3A27 27 weeks gestation of pregnancy: Secondary | ICD-10-CM | POA: Diagnosis not present

## 2016-06-08 DIAGNOSIS — O35EXX0 Maternal care for other (suspected) fetal abnormality and damage, fetal genitourinary anomalies, not applicable or unspecified: Secondary | ICD-10-CM

## 2016-06-08 DIAGNOSIS — O358XX0 Maternal care for other (suspected) fetal abnormality and damage, not applicable or unspecified: Secondary | ICD-10-CM | POA: Insufficient documentation

## 2016-06-09 ENCOUNTER — Other Ambulatory Visit (HOSPITAL_COMMUNITY): Payer: Self-pay | Admitting: *Deleted

## 2016-06-09 DIAGNOSIS — O358XX0 Maternal care for other (suspected) fetal abnormality and damage, not applicable or unspecified: Secondary | ICD-10-CM

## 2016-06-09 DIAGNOSIS — O35EXX0 Maternal care for other (suspected) fetal abnormality and damage, fetal genitourinary anomalies, not applicable or unspecified: Secondary | ICD-10-CM

## 2016-06-15 ENCOUNTER — Ambulatory Visit (INDEPENDENT_AMBULATORY_CARE_PROVIDER_SITE_OTHER): Payer: Medicaid Other | Admitting: Obstetrics & Gynecology

## 2016-06-15 VITALS — BP 129/80 | HR 84 | Wt 191.0 lb

## 2016-06-15 DIAGNOSIS — Z3483 Encounter for supervision of other normal pregnancy, third trimester: Secondary | ICD-10-CM

## 2016-06-15 DIAGNOSIS — O358XX0 Maternal care for other (suspected) fetal abnormality and damage, not applicable or unspecified: Secondary | ICD-10-CM

## 2016-06-15 DIAGNOSIS — Z23 Encounter for immunization: Secondary | ICD-10-CM | POA: Diagnosis not present

## 2016-06-15 DIAGNOSIS — O35EXX0 Maternal care for other (suspected) fetal abnormality and damage, fetal genitourinary anomalies, not applicable or unspecified: Secondary | ICD-10-CM

## 2016-06-15 LAB — CBC
HCT: 32.8 % — ABNORMAL LOW (ref 35.0–45.0)
Hemoglobin: 11 g/dL — ABNORMAL LOW (ref 11.7–15.5)
MCH: 31.7 pg (ref 27.0–33.0)
MCHC: 33.5 g/dL (ref 32.0–36.0)
MCV: 94.5 fL (ref 80.0–100.0)
MPV: 9.9 fL (ref 7.5–12.5)
PLATELETS: 307 10*3/uL (ref 140–400)
RBC: 3.47 MIL/uL — ABNORMAL LOW (ref 3.80–5.10)
RDW: 13.4 % (ref 11.0–15.0)
WBC: 13.7 10*3/uL — AB (ref 3.8–10.8)

## 2016-06-15 NOTE — Patient Instructions (Signed)
Return to clinic for any scheduled appointments or obstetric concerns, or go to MAU for evaluation  

## 2016-06-15 NOTE — Progress Notes (Signed)
   PRENATAL VISIT NOTE  Subjective:  [name] is a 25 y.o. G4P1021 at 4311w5d being seen today for ongoing prenatal care.  She is currently monitored for the following issues for this low-risk pregnancy and has Supervision of normal pregnancy in third trimester; BMI 33.0-33.9,adult; Obesity in pregnancy; and bilateral fetal pyelectasis on her problem list.  Patient reports no complaints.  Contractions: Not present. Vag. Bleeding: None.  Movement: Present. Denies leaking of fluid.   The following portions of the patient's history were reviewed and updated as appropriate: allergies, current medications, past family history, past medical history, past social history, past surgical history and problem list. Problem list updated.  Objective:   Vitals:   06/15/16 0853  BP: 129/80  Pulse: 84  Weight: 191 lb (86.6 kg)    Fetal Status: Fetal Heart Rate (bpm): 130 Fundal Height: 29 cm Movement: Present     General:  Alert, oriented and cooperative. Patient is in no acute distress.  Skin: Skin is warm and dry. No rash noted.   Cardiovascular: Normal heart rate noted  Respiratory: Normal respiratory effort, no problems with respiration noted  Abdomen: Soft, gravid, appropriate for gestational age. Pain/Pressure: Absent     Pelvic:  Cervical exam deferred        Extremities: Normal range of motion.  Edema: None  Mental Status: Normal mood and affect. Normal behavior. Normal judgment and thought content.   Assessment and Plan:  Pregnancy: G4P1021 at 8711w5d  1. Fetal pyelectasis, antepartum, single or unspecified fetus Improved on recent 27 week scan. Normal left kidney, 8 mm on right.  Rescans ordered as per MFM.   2. Need for Tdap vaccination - Tdap vaccine greater than or equal to 7yo IM  3. Encounter for supervision of other normal pregnancy in third trimester Third trimester labs today - Glucose Tolerance, 2 Hours w/1 Hour - CBC - RPR - HIV antibody  Preterm labor symptoms and  general obstetric precautions including but not limited to vaginal bleeding, contractions, leaking of fluid and fetal movement were reviewed in detail with the patient. Please refer to After Visit Summary for other counseling recommendations.  Return in about 3 weeks (around 07/06/2016) for OB Visit.   Tereso NewcomerUgonna A Janeth Terry, MD

## 2016-06-16 LAB — HIV ANTIBODY (ROUTINE TESTING W REFLEX): HIV: NONREACTIVE

## 2016-06-16 LAB — RPR

## 2016-06-16 LAB — GLUCOSE TOLERANCE, 2 HOURS W/ 1HR
GLUCOSE, 2 HOUR: 97 mg/dL (ref <140)
GLUCOSE, FASTING: 72 mg/dL (ref 65–99)
GLUCOSE: 139 mg/dL

## 2016-06-17 ENCOUNTER — Encounter: Payer: Self-pay | Admitting: *Deleted

## 2016-06-28 NOTE — L&D Delivery Note (Signed)
Delivery Note At 1:31 AM a viable female was easily delivered via Vaginal, Spontaneous Delivery (presentation: vertex ; ROA ).  APGAR: 9, 9; weight  .   Placenta status: intact , spontaneously delivered at ease.  Cord:  with the following complications: postpartum hemorrhage managed with clots removal, bimanual massage, Methergine 0.2mg  IM X 2, cytotec 1000mg  PR, pitocin IV. Bleeding resolved after such measures. By the end of the procedure, uterus was firm and bleeding had stopped. Cord pH: n/a I worn sterile attire during the entire procedure Anesthesia:  epidural Episiotomy: None Lacerations: None Suture Repair: none needed Est. Blood Loss (mL):  400cc  Mom to postpartum.  Baby to Couplet care / Skin to Skin.  Nehemiah SettleAna Carvalho do Silvio ClaymanAmaral MD PGY1 09/05/2016, 3:09 AM   OB FELLOW DELIVERY ATTESTATION  I was gloved and present for the delivery in its entirety, and I agree with the above resident's note.  Patient was stable and bleeding subsided.   Jen MowElizabeth Mumaw, DO OB Fellow

## 2016-07-09 ENCOUNTER — Ambulatory Visit (INDEPENDENT_AMBULATORY_CARE_PROVIDER_SITE_OTHER): Payer: Medicaid Other | Admitting: Obstetrics & Gynecology

## 2016-07-09 VITALS — BP 109/74 | HR 111 | Wt 194.0 lb

## 2016-07-09 DIAGNOSIS — Z3483 Encounter for supervision of other normal pregnancy, third trimester: Secondary | ICD-10-CM | POA: Diagnosis not present

## 2016-07-09 DIAGNOSIS — O358XX0 Maternal care for other (suspected) fetal abnormality and damage, not applicable or unspecified: Secondary | ICD-10-CM

## 2016-07-09 DIAGNOSIS — O368130 Decreased fetal movements, third trimester, not applicable or unspecified: Secondary | ICD-10-CM

## 2016-07-09 DIAGNOSIS — O35EXX0 Maternal care for other (suspected) fetal abnormality and damage, fetal genitourinary anomalies, not applicable or unspecified: Secondary | ICD-10-CM

## 2016-07-09 NOTE — Progress Notes (Signed)
   PRENATAL VISIT NOTE  Subjective:  [name] is a 26 y.o. G4P1021 at 3341w1d being seen today for ongoing prenatal care.  She is currently monitored for the following issues for this low-risk pregnancy and has Supervision of normal pregnancy in third trimester; BMI 33.0-33.9,adult; Obesity in pregnancy; and Fetal pyelectasis on her problem list.  Patient reports decreased fetal movement. Does not feel baby move daily. Did feel it yesterday after drinking juice.  Contractions: Not present. Vag. Bleeding: None.  Movement: (!) Decreased. Denies leaking of fluid.   The following portions of the patient's history were reviewed and updated as appropriate: allergies, current medications, past family history, past medical history, past social history, past surgical history and problem list. Problem list updated.  Objective:   Vitals:   07/09/16 1041  BP: 109/74  Pulse: (!) 111  Weight: 194 lb (88 kg)    Fetal Status: Fetal Heart Rate (bpm): 140 Fundal Height: 32 cm Movement: (!) Decreased     General:  Alert, oriented and cooperative. Patient is in no acute distress.  Skin: Skin is warm and dry. No rash noted.   Cardiovascular: Normal heart rate noted  Respiratory: Normal respiratory effort, no problems with respiration noted  Abdomen: Soft, gravid, appropriate for gestational age. Pain/Pressure: Present     Pelvic:  Cervical exam deferred        Extremities: Normal range of motion.  Edema: None  Mental Status: Normal mood and affect. Normal behavior. Normal judgment and thought content.   Assessment and Plan:  Pregnancy: G4P1021 at 8441w1d  1. Decreased fetal movements in third trimester, single or unspecified fetus NST performed today was reviewed and was found to be reactive. Emphasized importance of feeling fetal movement; reviewed kickcounts and forms given to patient. Will reevaluate next week.  2. Encounter for repeat ultrasound of fetal pyelectasis, antepartum, single or  unspecified fetus Follow up scan scheduled at [facility]  3. Encounter for supervision of other normal pregnancy in third trimester Preterm labor symptoms and general obstetric precautions including but not limited to vaginal bleeding, contractions, leaking of fluid and fetal movement were reviewed in detail with the patient. Please refer to After Visit Summary for other counseling recommendations.  Return in about 1 week (around 07/16/2016) for OB Visit (NST if still decreased FM).   Tereso NewcomerUgonna A Jariah Jarmon, MD

## 2016-07-09 NOTE — Patient Instructions (Signed)
Introduction Patient Name: ________________________________________________ Patient Due Date: ____________________ What is a fetal movement count? A fetal movement count is the number of times that you feel your baby move during a certain amount of time. This may also be called a fetal kick count. A fetal movement count is recommended for every pregnant woman. You may be asked to start counting fetal movements as early as week 28 of your pregnancy. Pay attention to when your baby is most active. You may notice your baby's sleep and wake cycles. You may also notice things that make your baby move more. You should do a fetal movement count:  When your baby is normally most active.  At the same time each day. A good time to count movements is while you are resting, after having something to eat and drink. How do I count fetal movements? 1. Find a quiet, comfortable area. Sit, or lie down on your side. 2. Write down the date, the start time and stop time, and the number of movements that you felt between those two times. Take this information with you to your health care visits. 3. For 2 hours, count kicks, flutters, swishes, rolls, and jabs. You should feel at least 10 movements during 2 hours. 4. You may stop counting after you have felt 10 movements. 5. If you do not feel 10 movements in 2 hours, have something to eat and drink. Then, keep resting and counting for 1 hour. If you feel at least 4 movements during that hour, you may stop counting. Contact a health care provider if:  You feel fewer than 4 movements in 2 hours.  Your baby is not moving like he or she usually does. Date: ____________ Start time: ____________ Stop time: ____________ Movements: ____________ Date: ____________ Start time: ____________ Stop time: ____________ Movements: ____________ Date: ____________ Start time: ____________ Stop time: ____________ Movements: ____________ Date: ____________ Start time: ____________  Stop time: ____________ Movements: ____________ Date: ____________ Start time: ____________ Stop time: ____________ Movements: ____________ Date: ____________ Start time: ____________ Stop time: ____________ Movements: ____________ Date: ____________ Start time: ____________ Stop time: ____________ Movements: ____________ Date: ____________ Start time: ____________ Stop time: ____________ Movements: ____________ Date: ____________ Start time: ____________ Stop time: ____________ Movements: ____________ This information is not intended to replace advice given to you by your health care provider. Make sure you discuss any questions you have with your health care provider. Document Released: 07/14/2006 Document Revised: 02/11/2016 Document Reviewed: 07/24/2015 Elsevier Interactive Patient Education  2017 [employer] Inc[employer]  

## 2016-07-15 ENCOUNTER — Encounter: Payer: Medicaid Other | Admitting: Student

## 2016-07-15 ENCOUNTER — Ambulatory Visit (INDEPENDENT_AMBULATORY_CARE_PROVIDER_SITE_OTHER): Payer: Medicaid Other | Admitting: Student

## 2016-07-15 DIAGNOSIS — Z3483 Encounter for supervision of other normal pregnancy, third trimester: Secondary | ICD-10-CM

## 2016-07-15 NOTE — Patient Instructions (Signed)
Braxton Hicks Contractions °Contractions of the uterus can occur throughout pregnancy. Contractions are not always a sign that you are in labor.  °WHAT ARE BRAXTON HICKS CONTRACTIONS?  °Contractions that occur before labor are called Braxton Hicks contractions, or false labor. Toward the end of pregnancy (32-34 weeks), these contractions can develop more often and may become more forceful. This is not true labor because these contractions do not result in opening (dilatation) and thinning of the cervix. They are sometimes difficult to tell apart from true labor because these contractions can be forceful and people have different pain tolerances. You should not feel embarrassed if you go to the hospital with false labor. Sometimes, the only way to tell if you are in true labor is for your health care provider to look for changes in the cervix. °If there are no prenatal problems or other health problems associated with the pregnancy, it is completely safe to be sent home with false labor and await the onset of true labor. °HOW CAN YOU TELL THE DIFFERENCE BETWEEN TRUE AND FALSE LABOR? °False Labor  °· The contractions of false labor are usually shorter and not as hard as those of true labor.   °· The contractions are usually irregular.   °· The contractions are often felt in the front of the lower abdomen and in the groin.   °· The contractions may go away when you walk around or change positions while lying down.   °· The contractions get weaker and are shorter lasting as time goes on.   °· The contractions do not usually become progressively stronger, regular, and closer together as with true labor.   °True Labor  °· Contractions in true labor last 30-70 seconds, become very regular, usually become more intense, and increase in frequency.   °· The contractions do not go away with walking.   °· The discomfort is usually felt in the top of the uterus and spreads to the lower abdomen and low back.   °· True labor can be  determined by your health care provider with an exam. This will show that the cervix is dilating and getting thinner.   °WHAT TO REMEMBER °· Keep up with your usual exercises and follow other instructions given by your health care provider.   °· Take medicines as directed by your health care provider.   °· Keep your regular prenatal appointments.   °· Eat and drink lightly if you think you are going into labor.   °· If Braxton Hicks contractions are making you uncomfortable:   °¨ Change your position from lying down or resting to walking, or from walking to resting.   °¨ Sit and rest in a tub of warm water.   °¨ Drink 2-3 glasses of water. Dehydration may cause these contractions.   °¨ Do slow and deep breathing several times an hour.   °WHEN SHOULD I SEEK IMMEDIATE MEDICAL CARE? °Seek immediate medical care if: °· Your contractions become stronger, more regular, and closer together.   °· You have fluid leaking or gushing from your vagina.   °· You have a fever.   °· You pass blood-tinged mucus.   °· You have vaginal bleeding.   °· You have continuous abdominal pain.   °· You have low back pain that you never had before.   °· You feel your baby's head pushing down and causing pelvic pressure.   °· Your baby is not moving as much as it used to.   °This information is not intended to replace advice given to you by your health care provider. Make sure you discuss any questions you have with your health care   provider. °Document Released: 06/14/2005 Document Revised: 10/06/2015 Document Reviewed: 03/26/2013 °Elsevier Interactive Patient Education © 2017 Elsevier Inc[employer] ° °

## 2016-07-15 NOTE — Progress Notes (Signed)
   PRENATAL VISIT NOTE  Subjective:  [name] is a 26 y.o. G4P1021 at 5629w0d being seen today for ongoing prenatal care.  She is currently monitored for the following issues for this low-risk pregnancy and has Supervision of normal pregnancy in third trimester; BMI 33.0-33.9,adult; Obesity in pregnancy; and Fetal pyelectasis on her problem list.  Patient reports no complaints.  Contractions: Not present. Vag. Bleeding: None.  Movement: Present. Denies leaking of fluid.   The following portions of the patient's history were reviewed and updated as appropriate: allergies, current medications, past family history, past medical history, past social history, past surgical history and problem list. Problem list updated.  Objective:   Vitals:   07/15/16 1551  BP: 119/76  Pulse: (!) 118  Weight: 197 lb (89.4 kg)    Fetal Status: Fetal Heart Rate (bpm): 158 Fundal Height: 35 cm Movement: Present     General:  Alert, oriented and cooperative. Patient is in no acute distress.  Skin: Skin is warm and dry. No rash noted.   Cardiovascular: Normal heart rate noted  Respiratory: Normal respiratory effort, no problems with respiration noted  Abdomen: Soft, gravid, appropriate for gestational age. Pain/Pressure: Present     Pelvic:  Cervical exam deferred        Extremities: Normal range of motion.  Edema: None  Mental Status: Normal mood and affect. Normal behavior. Normal judgment and thought content.   Assessment and Plan:  Pregnancy: G4P1021 at 4529w0d  1. Encounter for supervision of other normal pregnancy in third trimester Patient reports positive fetal movements, NST deferred for today. She is planning to keep her fup US on 07-20-2016.   Preterm labor symptoms and general obstetric precautions including but not limited to vaginal bleeding, contractions, leaking of fluid and fetal movement were reviewed in detail with the patient. Please refer to After Visit Summary for other counseling  recommendations.  Return in about 2 weeks (around 07/29/2016) for ROB.   [name], CNM

## 2016-07-20 ENCOUNTER — Ambulatory Visit (HOSPITAL_COMMUNITY)
Admission: RE | Admit: 2016-07-20 | Discharge: 2016-07-20 | Disposition: A | Discharge status: Home / Self Care | Payer: Medicaid Other | Source: Ambulatory Visit | Attending: Obstetrics & Gynecology | Admitting: Obstetrics & Gynecology

## 2016-07-20 ENCOUNTER — Other Ambulatory Visit (HOSPITAL_COMMUNITY): Payer: Self-pay | Admitting: Maternal and Fetal Medicine

## 2016-07-20 ENCOUNTER — Encounter (HOSPITAL_COMMUNITY): Payer: Self-pay

## 2016-07-20 DIAGNOSIS — O26879 Cervical shortening, unspecified trimester: Secondary | ICD-10-CM

## 2016-07-20 DIAGNOSIS — Z3A33 33 weeks gestation of pregnancy: Secondary | ICD-10-CM | POA: Diagnosis not present

## 2016-07-20 DIAGNOSIS — O358XX0 Maternal care for other (suspected) fetal abnormality and damage, not applicable or unspecified: Secondary | ICD-10-CM

## 2016-07-20 DIAGNOSIS — O359XX0 Maternal care for (suspected) fetal abnormality and damage, unspecified, not applicable or unspecified: Secondary | ICD-10-CM | POA: Diagnosis present

## 2016-07-20 DIAGNOSIS — O35EXX0 Maternal care for other (suspected) fetal abnormality and damage, fetal genitourinary anomalies, not applicable or unspecified: Secondary | ICD-10-CM

## 2016-07-29 ENCOUNTER — Encounter: Payer: Self-pay | Admitting: Obstetrics & Gynecology

## 2016-07-29 ENCOUNTER — Ambulatory Visit (INDEPENDENT_AMBULATORY_CARE_PROVIDER_SITE_OTHER): Payer: Medicaid Other | Admitting: Obstetrics & Gynecology

## 2016-07-29 VITALS — BP 127/78 | HR 109 | Wt 198.0 lb

## 2016-07-29 DIAGNOSIS — Z3483 Encounter for supervision of other normal pregnancy, third trimester: Secondary | ICD-10-CM

## 2016-07-29 NOTE — Patient Instructions (Signed)
Return to clinic for any scheduled appointments or obstetric concerns, or go to MAU for evaluation  

## 2016-07-29 NOTE — Progress Notes (Signed)
   PRENATAL VISIT NOTE  Subjective:  [name] HampshireHope S Corine [name] is a 26 y.o. G4P1021 at 10138w0d being seen today for ongoing prenatal care.  She is currently monitored for the following issues for this low-risk pregnancy and has Supervision of normal pregnancy in third trimester; BMI 33.0-33.9,adult; Obesity in pregnancy; and Fetal pyelectasis on her problem list.  Patient reports no complaints.  Contractions: Irregular. Vag. Bleeding: None.  Movement: Present. Denies leaking of fluid.   The following portions of the patient's history were reviewed and updated as appropriate: allergies, current medications, past family history, past medical history, past social history, past surgical history and problem list. Problem list updated.  Objective:   Vitals:   07/29/16 1018  BP: 127/78  Pulse: (!) 109  Weight: 198 lb (89.8 kg)    Fetal Status: Fetal Heart Rate (bpm): 140 Fundal Height: 36 cm Movement: Present     General:  Alert, oriented and cooperative. Patient is in no acute distress.  Skin: Skin is warm and dry. No rash noted.   Cardiovascular: Normal heart rate noted  Respiratory: Normal respiratory effort, no problems with respiration noted  Abdomen: Soft, gravid, appropriate for gestational age. Pain/Pressure: Present     Pelvic:  Cervical exam deferred        Extremities: Normal range of motion.  Edema: Trace  Mental Status: Normal mood and affect. Normal behavior. Normal judgment and thought content.   Assessment and Plan:  Pregnancy: G4P1021 at 1338w0d  1. Encounter for supervision of other normal pregnancy in third trimester Pregnancy restrictions letter given to patient as per her request. Preterm labor symptoms and general obstetric precautions including but not limited to vaginal bleeding, contractions, leaking of fluid and fetal movement were reviewed in detail with the patient. Please refer to After Visit Summary for other counseling recommendations.  Return in about 1 week (around  08/05/2016) for OB Visit, Pelvic cultures.   Tereso NewcomerUgonna A Gage Treiber, MD

## 2016-08-02 ENCOUNTER — Inpatient Hospital Stay (HOSPITAL_COMMUNITY)
Admission: AD | Admit: 2016-08-02 | Discharge: 2016-08-02 | Disposition: A | Discharge status: Home / Self Care | Payer: Medicaid Other | Source: Ambulatory Visit | Attending: Obstetrics & Gynecology | Admitting: Obstetrics & Gynecology

## 2016-08-02 ENCOUNTER — Encounter (HOSPITAL_COMMUNITY): Payer: Self-pay | Admitting: *Deleted

## 2016-08-02 DIAGNOSIS — Z3A36 36 weeks gestation of pregnancy: Secondary | ICD-10-CM | POA: Insufficient documentation

## 2016-08-02 DIAGNOSIS — B373 Candidiasis of vulva and vagina: Secondary | ICD-10-CM

## 2016-08-02 DIAGNOSIS — N898 Other specified noninflammatory disorders of vagina: Secondary | ICD-10-CM

## 2016-08-02 DIAGNOSIS — O26893 Other specified pregnancy related conditions, third trimester: Secondary | ICD-10-CM | POA: Diagnosis not present

## 2016-08-02 DIAGNOSIS — Z3483 Encounter for supervision of other normal pregnancy, third trimester: Secondary | ICD-10-CM | POA: Diagnosis present

## 2016-08-02 DIAGNOSIS — B3731 Acute candidiasis of vulva and vagina: Secondary | ICD-10-CM

## 2016-08-02 LAB — WET PREP, GENITAL
CLUE CELLS WET PREP: NONE SEEN
Sperm: NONE SEEN
Trich, Wet Prep: NONE SEEN
YEAST WET PREP: NONE SEEN

## 2016-08-02 LAB — AMNISURE RUPTURE OF MEMBRANE (ROM) NOT AT ARMC: AMNISURE: NEGATIVE

## 2016-08-02 MED ORDER — TERCONAZOLE 0.4 % VA CREA: 1.0000 | TOPICAL_CREAM | Freq: Every day | VAGINAL | 0 refills | Status: DC

## 2016-08-02 NOTE — MAU Note (Signed)
Urine in lab 

## 2016-08-02 NOTE — Discharge Instructions (Signed)

## 2016-08-02 NOTE — MAU Note (Signed)
Patient presents to mau with c/o leaking clear fluid since 5am. States she stood up and had a "gush". Denies VB at this time. Occassional contraction. +FM.

## 2016-08-06 ENCOUNTER — Other Ambulatory Visit (HOSPITAL_COMMUNITY)
Admission: RE | Admit: 2016-08-06 | Discharge: 2016-08-06 | Disposition: A | Discharge status: Home / Self Care | Payer: Medicaid Other | Source: Ambulatory Visit | Attending: Obstetrics & Gynecology | Admitting: Obstetrics & Gynecology

## 2016-08-06 ENCOUNTER — Ambulatory Visit (INDEPENDENT_AMBULATORY_CARE_PROVIDER_SITE_OTHER): Payer: Medicaid Other | Admitting: Obstetrics & Gynecology

## 2016-08-06 VITALS — BP 122/72 | HR 102 | Wt 199.0 lb

## 2016-08-06 DIAGNOSIS — Z113 Encounter for screening for infections with a predominantly sexual mode of transmission: Secondary | ICD-10-CM | POA: Insufficient documentation

## 2016-08-06 DIAGNOSIS — Z3483 Encounter for supervision of other normal pregnancy, third trimester: Secondary | ICD-10-CM

## 2016-08-06 NOTE — Progress Notes (Signed)
   PRENATAL VISIT NOTE  Subjective:  [name] is a 26 y.o. G4P1021 at 3316w1d being seen today for ongoing prenatal care.  She is currently monitored for the following issues for this low-risk pregnancy and has Supervision of normal pregnancy in third trimester; BMI 33.0-33.9,adult; Obesity in pregnancy; and Fetal pyelectasis on her problem list.  Patient reports treated for vaginal yeast infection.   .  .   . Denies leaking of fluid.   The following portions of the patient's history were reviewed and updated as appropriate: allergies, current medications, past family history, past medical history, past social history, past surgical history and problem list. Problem list updated.  Objective:  There were no vitals filed for this visit.  Fetal Status:           General:  Alert, oriented and cooperative. Patient is in no acute distress.  Skin: Skin is warm and dry. No rash noted.   Cardiovascular: Normal heart rate noted  Respiratory: Normal respiratory effort, no problems with respiration noted  Abdomen: Soft, gravid, appropriate for gestational age.       Pelvic:  Cervical exam performed        Extremities: Normal range of motion.     Mental Status: Normal mood and affect. Normal behavior. Normal judgment and thought content.   Assessment and Plan:  Pregnancy: G4P1021 at 4116w1d  1. Encounter for supervision of other normal pregnancy in third trimester  - Strep Gp B NAA - GC[facility]Chlamydia probe amp [facility]  Preterm labor symptoms and general obstetric precautions including but not limited to vaginal bleeding, contractions, leaking of fluid and fetal movement were reviewed in detail with the patient. Please refer to After Visit Summary for other counseling recommendations.  Return in about 1 week (around 08/13/2016).   Adam PhenixJames G Cherrise Occhipinti, MD

## 2016-08-07 LAB — OB RESULTS CONSOLE GBS: STREP GROUP B AG: NEGATIVE

## 2016-08-08 ENCOUNTER — Encounter (HOSPITAL_COMMUNITY): Payer: Self-pay | Admitting: *Deleted

## 2016-08-08 ENCOUNTER — Inpatient Hospital Stay (HOSPITAL_COMMUNITY)
Admission: AD | Admit: 2016-08-08 | Discharge: 2016-08-08 | Disposition: A | Discharge status: Home / Self Care | Payer: Medicaid Other | Source: Ambulatory Visit | Attending: Obstetrics & Gynecology | Admitting: Obstetrics & Gynecology

## 2016-08-08 DIAGNOSIS — Z87891 Personal history of nicotine dependence: Secondary | ICD-10-CM | POA: Insufficient documentation

## 2016-08-08 DIAGNOSIS — O479 False labor, unspecified: Secondary | ICD-10-CM

## 2016-08-08 DIAGNOSIS — O4703 False labor before 37 completed weeks of gestation, third trimester: Secondary | ICD-10-CM | POA: Insufficient documentation

## 2016-08-08 DIAGNOSIS — Z3A36 36 weeks gestation of pregnancy: Secondary | ICD-10-CM

## 2016-08-08 DIAGNOSIS — O26893 Other specified pregnancy related conditions, third trimester: Secondary | ICD-10-CM | POA: Diagnosis present

## 2016-08-08 LAB — URINALYSIS, ROUTINE W REFLEX MICROSCOPIC
Bilirubin Urine: NEGATIVE
Hgb urine dipstick: NEGATIVE
Ketones, ur: NEGATIVE mg/dL
Leukocytes, UA: NEGATIVE
Nitrite: NEGATIVE
PH: 6 (ref 5.0–8.0)
Protein, ur: NEGATIVE mg/dL
SPECIFIC GRAVITY, URINE: 1.02 (ref 1.005–1.030)

## 2016-08-08 LAB — POCT FERN TEST: POCT Fern Test: NEGATIVE

## 2016-08-08 LAB — STREP GP B NAA: STREP GROUP B AG: NEGATIVE

## 2016-08-08 LAB — AMNISURE RUPTURE OF MEMBRANE (ROM) NOT AT ARMC: Amnisure ROM: NEGATIVE

## 2016-08-08 LAB — GLUCOSE, CAPILLARY: Glucose-Capillary: 93 mg/dL (ref 65–99)

## 2016-08-08 NOTE — MAU Note (Signed)
Pt reports leaking of clear two hours ago about 15 minutes apart. Pt states that it soaked through her paints. Pt also reports contractions that started about 1 hour ago. Pt describes the contractions as irregular and some are 8/10, but others are not as strong. Denies bleeding, + FM

## 2016-08-08 NOTE — Discharge Instructions (Signed)
Braxton Hicks Contractions Contractions of the uterus can occur throughout pregnancy. Contractions are not always a sign that you are in labor.  WHAT ARE BRAXTON HICKS CONTRACTIONS?  Contractions that occur before labor are called Braxton Hicks contractions, or false labor. Toward the end of pregnancy (32-34 weeks), these contractions can develop more often and may become more forceful. This is not true labor because these contractions do not result in opening (dilatation) and thinning of the cervix. They are sometimes difficult to tell apart from true labor because these contractions can be forceful and people have different pain tolerances. You should not feel embarrassed if you go to the hospital with false labor. Sometimes, the only way to tell if you are in true labor is for your health care provider to look for changes in the cervix. If there are no prenatal problems or other health problems associated with the pregnancy, it is completely safe to be sent home with false labor and await the onset of true labor. HOW CAN YOU TELL THE DIFFERENCE BETWEEN TRUE AND FALSE LABOR? False Labor   The contractions of false labor are usually shorter and not as hard as those of true labor.   The contractions are usually irregular.   The contractions are often felt in the front of the lower abdomen and in the groin.   The contractions may go away when you walk around or change positions while lying down.   The contractions get weaker and are shorter lasting as time goes on.   The contractions do not usually become progressively stronger, regular, and closer together as with true labor.  True Labor   Contractions in true labor last 30-70 seconds, become very regular, usually become more intense, and increase in frequency.   The contractions do not go away with walking.   The discomfort is usually felt in the top of the uterus and spreads to the lower abdomen and low back.   True labor can be  determined by your health care provider with an exam. This will show that the cervix is dilating and getting thinner.  WHAT TO REMEMBER  Keep up with your usual exercises and follow other instructions given by your health care provider.   Take medicines as directed by your health care provider.   Keep your regular prenatal appointments.   Eat and drink lightly if you think you are going into labor.   If Braxton Hicks contractions are making you uncomfortable:   Change your position from lying down or resting to walking, or from walking to resting.   Sit and rest in a tub of warm water.   Drink 2-3 glasses of water. Dehydration may cause these contractions.   Do slow and deep breathing several times an hour.  WHEN SHOULD I SEEK IMMEDIATE MEDICAL CARE? Seek immediate medical care if:  Your contractions become stronger, more regular, and closer together.   You have fluid leaking or gushing from your vagina.   You have a fever.   You pass blood-tinged mucus.   You have vaginal bleeding.   You have continuous abdominal pain.   You have low back pain that you never had before.   You feel your baby's head pushing down and causing pelvic pressure.   Your baby is not moving as much as it used to.  This information is not intended to replace advice given to you by your health care provider. Make sure you discuss any questions you have with your health care   provider. Document Released: 06/14/2005 Document Revised: 10/06/2015 Document Reviewed: 03/26/2013 Elsevier Interactive Patient Education  2017 [employer] Inc[employer] Introduction Patient Name: ________________________________________________ Patient Due Date: ____________________ What is a fetal movement count? A fetal movement count is the number of times that you feel your baby move during a certain amount of time. This may also be called a fetal kick count. A fetal movement count is recommended for every pregnant  woman. You may be asked to start counting fetal movements as early as week 28 of your pregnancy. Pay attention to when your baby is most active. You may notice your baby's sleep and wake cycles. You may also notice things that make your baby move more. You should do a fetal movement count:  When your baby is normally most active.  At the same time each day. A good time to count movements is while you are resting, after having something to eat and drink. How do I count fetal movements? 1. Find a quiet, comfortable area. Sit, or lie down on your side. 2. Write down the date, the start time and stop time, and the number of movements that you felt between those two times. Take this information with you to your health care visits. 3. For 2 hours, count kicks, flutters, swishes, rolls, and jabs. You should feel at least 10 movements during 2 hours. 4. You may stop counting after you have felt 10 movements. 5. If you do not feel 10 movements in 2 hours, have something to eat and drink. Then, keep resting and counting for 1 hour. If you feel at least 4 movements during that hour, you may stop counting. Contact a health care provider if:  You feel fewer than 4 movements in 2 hours.  Your baby is not moving like he or she usually does. Date: ____________ Start time: ____________ Stop time: ____________ Movements: ____________ Date: ____________ Start time: ____________ Stop time: ____________ Movements: ____________ Date: ____________ Start time: ____________ Stop time: ____________ Movements: ____________ Date: ____________ Start time: ____________ Stop time: ____________ Movements: ____________ Date: ____________ Start time: ____________ Stop time: ____________ Movements: ____________ Date: ____________ Start time: ____________ Stop time: ____________ Movements: ____________ Date: ____________ Start time: ____________ Stop time: ____________ Movements: ____________ Date: ____________ Start time:  ____________ Stop time: ____________ Movements: ____________ Date: ____________ Start time: ____________ Stop time: ____________ Movements: ____________ This information is not intended to replace advice given to you by your health care provider. Make sure you discuss any questions you have with your health care provider. Document Released: 07/14/2006 Document Revised: 02/11/2016 Document Reviewed: 07/24/2015 Elsevier Interactive Patient Education  2017 [employer] Inc.  

## 2016-08-08 NOTE — MAU Provider Note (Signed)
History   G4P1021 @ 36.3 wks in with gush of fluid. No further leaking of fluid.   CSN: [id number]  Arrival date & time 08/08/16  1900   First Provider Initiated Contact with Patient 08/08/16 1957      Chief Complaint  Patient presents with  . Vaginal Discharge  . Contractions    HPI  Past Medical History:  Diagnosis Date  . BV (bacterial vaginosis)   . Medical history non-contributory   . Trichomonas     Past Surgical History:  Procedure Laterality Date  . DILATION AND CURETTAGE OF UTERUS      Family History  Problem Relation Age of Onset  . Cancer Brother   . Hypertension Maternal Grandfather   . Diabetes Brother   . Heart disease Neg Hx     Social History  Substance Use Topics  . Smoking status: Former Smoker    Quit date: 04/02/2013  . Smokeless tobacco: Former NeurosurgeonUser  . Alcohol use No     Comment: occasionally     OB History    Gravida Para Term Preterm AB Living   4 1 1  0 2 1   SAB TAB Ectopic Multiple Live Births   1 0 0 0 1      Review of Systems  Constitutional: Negative.   HENT: Negative.   Eyes: Negative.   Respiratory: Negative.   Cardiovascular: Negative.   Gastrointestinal: Negative.   Endocrine: Negative.   Genitourinary: Negative.   Musculoskeletal: Negative.   Skin: Negative.     Allergies  Latex and Sulfa antibiotics  Home Medications    BP 123/64 (BP Location: Right Arm)   Pulse 108   Temp 99 F (37.2 C)   Resp 16   Ht 5\' 3"  (1.6 m)   Wt 200 lb (90.7 kg)   LMP  (LMP Unknown) Comment: Nexplanon removed in May, no period since.  BMI 35.43 kg/m   Physical Exam  Constitutional: She is oriented to person, place, and time. She appears well-developed and well-nourished.  HENT:  Head: Normocephalic.  Eyes: Pupils are equal, round, and reactive to light.  Neck: Normal range of motion.  Cardiovascular: Normal rate, regular rhythm, normal heart sounds and intact distal pulses.   Pulmonary/Chest: Effort normal and breath  sounds normal.  Abdominal: Soft. Bowel sounds are normal.  Genitourinary: Vagina normal and uterus normal.  Musculoskeletal: Normal range of motion.  Neurological: She is alert and oriented to person, place, and time. She has normal reflexes.  Skin: Skin is warm and dry.  Psychiatric: She has a normal mood and affect. Her behavior is normal. Judgment and thought content normal.    MAU Course  Procedures (including critical care time)  Results for orders placed or performed during the hospital encounter of 08/08/16 (from the past 24 hour(s))  Urinalysis, Routine w reflex microscopic     Status: Abnormal   Collection Time: 08/08/16  7:10 PM  Result Value Ref Range   Color, Urine YELLOW YELLOW   APPearance HAZY (A) CLEAR   Specific Gravity, Urine 1.020 1.005 - 1.030   pH 6.0 5.0 - 8.0   Glucose, UA >=500 (A) NEGATIVE mg/dL   Hgb urine dipstick NEGATIVE NEGATIVE   Bilirubin Urine NEGATIVE NEGATIVE   Ketones, ur NEGATIVE NEGATIVE mg/dL   Protein, ur NEGATIVE NEGATIVE mg/dL   Nitrite NEGATIVE NEGATIVE   Leukocytes, UA NEGATIVE NEGATIVE   RBC / HPF 0-5 0 - 5 RBC/hpf   WBC, UA 0-5 0 - 5 WBC/hpf  Bacteria, UA RARE (A) NONE SEEN   Squamous Epithelial / LPF 6-30 (A) NONE SEEN   Mucous PRESENT   Fern Test     Status: None   Collection Time: 08/08/16  7:30 PM  Result Value Ref Range   POCT Fern Test Negative = intact amniotic membranes   Amnisure rupture of membrane (rom)not at [facility]     Status: None   Collection Time: 08/08/16  7:55 PM  Result Value Ref Range   Amnisure ROM NEGATIVE   Glucose, capillary     Status: None   Collection Time: 08/08/16  8:02 PM  Result Value Ref Range   Glucose-Capillary 93 65 - 99 mg/dL     1. False labor, unspecified    Care of pt turned over from Zerita Boers, [place] to [place],CNM. Amnisure reviewed--Neg. Will D/C home. No evidence of ROM.    MDM  Fern neg. FHR pattern reassuring, no uc's. SVE cl/th/post/ballots.  amnisure neg, will d/c  home   [place], [place] 08/08/2016 9:41 PM

## 2016-08-09 LAB — GC/CHLAMYDIA PROBE AMP (~~LOC~~) NOT AT ARMC
CHLAMYDIA, DNA PROBE: NEGATIVE
NEISSERIA GONORRHEA: NEGATIVE

## 2016-08-13 ENCOUNTER — Encounter: Payer: Self-pay | Admitting: Radiology

## 2016-08-13 ENCOUNTER — Ambulatory Visit (INDEPENDENT_AMBULATORY_CARE_PROVIDER_SITE_OTHER): Payer: Medicaid Other | Admitting: Obstetrics and Gynecology

## 2016-08-13 VITALS — BP 112/74 | HR 106 | Wt 198.0 lb

## 2016-08-13 DIAGNOSIS — Z3483 Encounter for supervision of other normal pregnancy, third trimester: Secondary | ICD-10-CM

## 2016-08-13 DIAGNOSIS — O358XX0 Maternal care for other (suspected) fetal abnormality and damage, not applicable or unspecified: Secondary | ICD-10-CM

## 2016-08-13 DIAGNOSIS — E669 Obesity, unspecified: Secondary | ICD-10-CM

## 2016-08-13 DIAGNOSIS — O35EXX0 Maternal care for other (suspected) fetal abnormality and damage, fetal genitourinary anomalies, not applicable or unspecified: Secondary | ICD-10-CM

## 2016-08-13 DIAGNOSIS — O99210 Obesity complicating pregnancy, unspecified trimester: Secondary | ICD-10-CM

## 2016-08-13 DIAGNOSIS — O99213 Obesity complicating pregnancy, third trimester: Secondary | ICD-10-CM

## 2016-08-13 NOTE — Progress Notes (Signed)
   PRENATAL VISIT NOTE  Subjective:  [name] is a 26 y.o. G4P1021 at 5295w1d being seen today for ongoing prenatal care.  She is currently monitored for the following issues for this low-risk pregnancy and has Supervision of normal pregnancy in third trimester; BMI 33.0-33.9,adult; Obesity in pregnancy; and Fetal pyelectasis on her problem list.  Patient reports no complaints.   . Vag. Bleeding: None.  Movement: Present. Denies leaking of fluid.   The following portions of the patient's history were reviewed and updated as appropriate: allergies, current medications, past family history, past medical history, past social history, past surgical history and problem list. Problem list updated.  Objective:   Vitals:   08/13/16 0825  BP: 112/74  Pulse: (!) 106  Weight: 198 lb (89.8 kg)    Fetal Status: Fetal Heart Rate (bpm): 136 Fundal Height: 38 cm Movement: Present     General:  Alert, oriented and cooperative. Patient is in no acute distress.  Skin: Skin is warm and dry. No rash noted.   Cardiovascular: Normal heart rate noted  Respiratory: Normal respiratory effort, no problems with respiration noted  Abdomen: Soft, gravid, appropriate for gestational age. Pain/Pressure: Present     Pelvic:  Cervical exam deferred        Extremities: Normal range of motion.  Edema: Trace  Mental Status: Normal mood and affect. Normal behavior. Normal judgment and thought content.   Assessment and Plan:  Pregnancy: G4P1021 at 3495w1d  1. Encounter for supervision of other normal pregnancy in third trimester Patient is doing well without complaints Culture results reviewed with the patient  2. Obesity in pregnancy Good weight gain during pregnancy  3. Encounter for repeat ultrasound of fetal pyelectasis, antepartum, single or unspecified fetus Will need to follow up postpartum for pyelectasis with peds  Term labor symptoms and general obstetric precautions including but not limited to  vaginal bleeding, contractions, leaking of fluid and fetal movement were reviewed in detail with the patient. Please refer to After Visit Summary for other counseling recommendations.  Return in about 1 week (around 08/20/2016) for ROB.   Catalina AntiguaPeggy Arva Slaugh, MD

## 2016-08-20 ENCOUNTER — Ambulatory Visit (INDEPENDENT_AMBULATORY_CARE_PROVIDER_SITE_OTHER): Payer: Medicaid Other | Admitting: Obstetrics & Gynecology

## 2016-08-20 VITALS — BP 103/71 | HR 91 | Wt 198.0 lb

## 2016-08-20 DIAGNOSIS — Z3483 Encounter for supervision of other normal pregnancy, third trimester: Secondary | ICD-10-CM

## 2016-08-20 NOTE — Patient Instructions (Signed)
Return to clinic for any scheduled appointments or obstetric concerns, or go to MAU for evaluation  

## 2016-08-20 NOTE — Progress Notes (Signed)
   PRENATAL VISIT NOTE  Subjective:  [name] is a 26 y.o. G4P1021 at 6861w1d being seen today for ongoing prenatal care.  She is currently monitored for the following issues for this low-risk pregnancy and has Supervision of normal pregnancy in third trimester; BMI 33.0-33.9,adult; Obesity in pregnancy; and Fetal pyelectasis on her problem list.  Patient reports occasional leaking of clear fluid. Was seen in MAU recently and had negative ROM evaluation.  Contractions: Irregular. Vag. Bleeding: None.  Movement: Present.   The following portions of the patient's history were reviewed and updated as appropriate: allergies, current medications, past family history, past medical history, past social history, past surgical history and problem list. Problem list updated.  Objective:   Vitals:   08/20/16 0935  BP: 103/71  Pulse: 91  Weight: 198 lb (89.8 kg)    Fetal Status: Fetal Heart Rate (bpm): 139 Fundal Height: 39 cm Movement: Present  Presentation: Vertex  General:  Alert, oriented and cooperative. Patient is in no acute distress.  Skin: Skin is warm and dry. No rash noted.   Cardiovascular: Normal heart rate noted  Respiratory: Normal respiratory effort, no problems with respiration noted  Abdomen: Soft, gravid, appropriate for gestational age. Pain/Pressure: Present     Pelvic:  No pooling, negative nitrazine.  Cervical exam performed Dilation: 2 Effacement (%): 50 Station: -3  Extremities: Normal range of motion.  Edema: Trace  Mental Status: Normal mood and affect. Normal behavior. Normal judgment and thought content.   Assessment and Plan:  Pregnancy: G4P1021 at 3961w1d  1. Encounter for supervision of other normal pregnancy in third trimester No evidence of ROM, patient reassured. Term labor symptoms and general obstetric precautions including but not limited to vaginal bleeding, contractions, leaking of fluid and fetal movement were reviewed in detail with the  patient. Please refer to After Visit Summary for other counseling recommendations.  Return in about 1 week (around 08/27/2016) for OB Visit.   Tereso NewcomerUgonna A Anyanwu, MD

## 2016-08-27 ENCOUNTER — Ambulatory Visit (INDEPENDENT_AMBULATORY_CARE_PROVIDER_SITE_OTHER): Payer: Medicaid Other | Admitting: Obstetrics and Gynecology

## 2016-08-27 DIAGNOSIS — Z3483 Encounter for supervision of other normal pregnancy, third trimester: Secondary | ICD-10-CM

## 2016-08-27 NOTE — Progress Notes (Signed)
   PRENATAL VISIT NOTE  Subjective:  [name] is a 26 y.o. G4P1021 at 4421w1d being seen today for ongoing prenatal care.  She is currently monitored for the following issues for this low-risk pregnancy and has Supervision of normal pregnancy in third trimester; BMI 33.0-33.9,adult; Obesity in pregnancy; and Fetal pyelectasis on her problem list.  Patient reports no complaints.  Contractions: Irregular.  .  Movement: Present. Denies leaking of fluid.   The following portions of the patient's history were reviewed and updated as appropriate: allergies, current medications, past family history, past medical history, past social history, past surgical history and problem list. Problem list updated.  Objective:   Vitals:   08/27/16 0937  BP: 112/71  Pulse: 97  Weight: 203 lb (92.1 kg)    Fetal Status: Fetal Heart Rate (bpm): 152 Fundal Height: 39 cm Movement: Present  Presentation: Vertex  General:  Alert, oriented and cooperative. Patient is in no acute distress.  Skin: Skin is warm and dry. No rash noted.   Cardiovascular: Normal heart rate noted  Respiratory: Normal respiratory effort, no problems with respiration noted  Abdomen: Soft, gravid, appropriate for gestational age. Pain/Pressure: Present     Pelvic:  Cervical exam performed Dilation: 2 Effacement (%): 50 Station: -3  Extremities: Normal range of motion.  Edema: Trace  Mental Status: Normal mood and affect. Normal behavior. Normal judgment and thought content.   Assessment and Plan:  Pregnancy: G4P1021 at 1921w1d  1. Encounter for supervision of other normal pregnancy in third trimester Patient is doing well without complaints Will plan for IOL at 41 weeks. Postdate fetal testing planned  Term labor symptoms and general obstetric precautions including but not limited to vaginal bleeding, contractions, leaking of fluid and fetal movement were reviewed in detail with the patient. Please refer to After Visit Summary for  other counseling recommendations.  Return in about 1 week (around 09/03/2016) for ROB, NST.   Catalina AntiguaPeggy Geoff Dacanay, MD

## 2016-08-31 ENCOUNTER — Encounter (HOSPITAL_COMMUNITY): Payer: Self-pay | Admitting: *Deleted

## 2016-08-31 ENCOUNTER — Inpatient Hospital Stay (HOSPITAL_COMMUNITY)
Admission: AD | Admit: 2016-08-31 | Discharge: 2016-08-31 | Disposition: A | Discharge status: Home / Self Care | Payer: Medicaid Other | Source: Ambulatory Visit | Attending: Obstetrics & Gynecology | Admitting: Obstetrics & Gynecology

## 2016-08-31 DIAGNOSIS — Z87891 Personal history of nicotine dependence: Secondary | ICD-10-CM | POA: Insufficient documentation

## 2016-08-31 DIAGNOSIS — S334XXA Traumatic rupture of symphysis pubis, initial encounter: Secondary | ICD-10-CM

## 2016-08-31 DIAGNOSIS — Z3A39 39 weeks gestation of pregnancy: Secondary | ICD-10-CM | POA: Insufficient documentation

## 2016-08-31 DIAGNOSIS — O26893 Other specified pregnancy related conditions, third trimester: Secondary | ICD-10-CM | POA: Diagnosis present

## 2016-08-31 LAB — URINALYSIS, ROUTINE W REFLEX MICROSCOPIC
Bilirubin Urine: NEGATIVE
GLUCOSE, UA: NEGATIVE mg/dL
Hgb urine dipstick: NEGATIVE
KETONES UR: NEGATIVE mg/dL
Nitrite: NEGATIVE
PH: 6 (ref 5.0–8.0)
Protein, ur: 30 mg/dL — AB
SPECIFIC GRAVITY, URINE: 1.02 (ref 1.005–1.030)

## 2016-08-31 MED ORDER — ACETAMINOPHEN 500 MG PO TABS
1000.0000 mg | ORAL_TABLET | Freq: Once | ORAL | Status: AC
  Administered 2016-08-31: 1000 mg via ORAL
  Filled 2016-08-31: qty 2

## 2016-08-31 NOTE — Discharge Instructions (Signed)
Third Trimester of Pregnancy The third trimester is from week 28 through week 40 (months 7 through 9). The third trimester is a time when the unborn baby (fetus) is growing rapidly. At the end of the ninth month, the fetus is about 20 inches in length and weighs 6-10 pounds. Body changes during your third trimester Your body will continue to go through many changes during pregnancy. The changes vary from woman to woman. During the third trimester:  Your weight will continue to increase. You can expect to gain 25-35 pounds (11-16 kg) by the end of the pregnancy.  You may begin to get stretch marks on your hips, abdomen, and breasts.  You may urinate more often because the fetus is moving lower into your pelvis and pressing on your bladder.  You may develop or continue to have heartburn. This is caused by increased hormones that slow down muscles in the digestive tract.  You may develop or continue to have constipation because increased hormones slow digestion and cause the muscles that push waste through your intestines to relax.  You may develop hemorrhoids. These are swollen veins (varicose veins) in the rectum that can itch or be painful.  You may develop swollen, bulging veins (varicose veins) in your legs.  You may have increased body aches in the pelvis, back, or thighs. This is due to weight gain and increased hormones that are relaxing your joints.  You may have changes in your hair. These can include thickening of your hair, rapid growth, and changes in texture. Some women also have hair loss during or after pregnancy, or hair that feels dry or thin. Your hair will most likely return to normal after your baby is born.  Your breasts will continue to grow and they will continue to become tender. A yellow fluid (colostrum) may leak from your breasts. This is the first milk you are producing for your baby.  Your belly button may stick out.  You may notice more swelling in your hands,  face, or ankles.  You may have increased tingling or numbness in your hands, arms, and legs. The skin on your belly may also feel numb.  You may feel short of breath because of your expanding uterus.  You may have more problems sleeping. This can be caused by the size of your belly, increased need to urinate, and an increase in your body's metabolism.  You may notice the fetus "dropping," or moving lower in your abdomen (lightening).  You may have increased vaginal discharge.  You may notice your joints feel loose and you may have pain around your pelvic bone.  What to expect at prenatal visits You will have prenatal exams every 2 weeks until week 36. Then you will have weekly prenatal exams. During a routine prenatal visit:  You will be weighed to make sure you and the baby are growing normally.  Your blood pressure will be taken.  Your abdomen will be measured to track your baby's growth.  The fetal heartbeat will be listened to.  Any test results from the previous visit will be discussed.  You may have a cervical check near your due date to see if your cervix has softened or thinned (effaced).  You will be tested for Group B streptococcus. This happens between 35 and 37 weeks.  Your health care provider may ask you:  What your birth plan is.  How you are feeling.  If you are feeling the baby move.  If you have had   any abnormal symptoms, such as leaking fluid, bleeding, severe headaches, or abdominal cramping.  If you are using any tobacco products, including cigarettes, chewing tobacco, and electronic cigarettes.  If you have any questions.  Other tests or screenings that may be performed during your third trimester include:  Blood tests that check for low iron levels (anemia).  Fetal testing to check the health, activity level, and growth of the fetus. Testing is done if you have certain medical conditions or if there are problems during the  pregnancy.  Nonstress test (NST). This test checks the health of your baby to make sure there are no signs of problems, such as the baby not getting enough oxygen. During this test, a belt is placed around your belly. The baby is made to move, and its heart rate is monitored during movement.  What is false labor? False labor is a condition in which you feel small, irregular tightenings of the muscles in the womb (contractions) that usually go away with rest, changing position, or drinking water. These are called Braxton Hicks contractions. Contractions may last for hours, days, or even weeks before true labor sets in. If contractions come at regular intervals, become more frequent, increase in intensity, or become painful, you should see your health care provider. What are the signs of labor?  Abdominal cramps.  Regular contractions that start at 10 minutes apart and become stronger and more frequent with time.  Contractions that start on the top of the uterus and spread down to the lower abdomen and back.  Increased pelvic pressure and dull back pain.  A watery or bloody mucus discharge that comes from the vagina.  Leaking of amniotic fluid. This is also known as your "water breaking." It could be a slow trickle or a gush. Let your health care provider know if it has a color or strange odor. If you have any of these signs, call your health care provider right away, even if it is before your due date. Follow these instructions at home: Medicines  Follow your health care provider's instructions regarding medicine use. Specific medicines may be either safe or unsafe to take during pregnancy.  Take a prenatal vitamin that contains at least 600 micrograms (mcg) of folic acid.  If you develop constipation, try taking a stool softener if your health care provider approves. Eating and drinking  Eat a balanced diet that includes fresh fruits and vegetables, whole grains, good sources of protein  such as meat, eggs, or tofu, and low-fat dairy. Your health care provider will help you determine the amount of weight gain that is right for you.  Avoid raw meat and uncooked cheese. These carry germs that can cause birth defects in the baby.  If you have low calcium intake from food, talk to your health care provider about whether you should take a daily calcium supplement.  Eat four or five small meals rather than three large meals a day.  Limit foods that are high in fat and processed sugars, such as fried and sweet foods.  To prevent constipation: ? Drink enough fluid to keep your urine clear or pale yellow. ? Eat foods that are high in fiber, such as fresh fruits and vegetables, whole grains, and beans. Activity  Exercise only as directed by your health care provider. Most women can continue their usual exercise routine during pregnancy. Try to exercise for 30 minutes at least 5 days a week. Stop exercising if you experience uterine contractions.  Avoid heavy   lifting.  Do not exercise in extreme heat or humidity, or at high altitudes.  Wear low-heel, comfortable shoes.  Practice good posture.  You may continue to have sex unless your health care provider tells you otherwise. Relieving pain and discomfort  Take frequent breaks and rest with your legs elevated if you have leg cramps or low back pain.  Take warm sitz baths to soothe any pain or discomfort caused by hemorrhoids. Use hemorrhoid cream if your health care provider approves.  Wear a good support bra to prevent discomfort from breast tenderness.  If you develop varicose veins: ? Wear support pantyhose or compression stockings as told by your healthcare provider. ? Elevate your feet for 15 minutes, 3-4 times a day. Prenatal care  Write down your questions. Take them to your prenatal visits.  Keep all your prenatal visits as told by your health care provider. This is important. Safety  Wear your seat belt at  all times when driving.  Make a list of emergency phone numbers, including numbers for family, friends, the hospital, and police and fire departments. General instructions  Avoid cat litter boxes and soil used by cats. These carry germs that can cause birth defects in the baby. If you have a cat, ask someone to clean the litter box for you.  Do not travel far distances unless it is absolutely necessary and only with the approval of your health care provider.  Do not use hot tubs, steam rooms, or saunas.  Do not drink alcohol.  Do not use any products that contain nicotine or tobacco, such as cigarettes and e-cigarettes. If you need help quitting, ask your health care provider.  Do not use any medicinal herbs or unprescribed drugs. These chemicals affect the formation and growth of the baby.  Do not douche or use tampons or scented sanitary pads.  Do not cross your legs for long periods of time.  To prepare for the arrival of your baby: ? Take prenatal classes to understand, practice, and ask questions about labor and delivery. ? Make a trial run to the hospital. ? Visit the hospital and tour the maternity area. ? Arrange for maternity or paternity leave through employers. ? Arrange for family and friends to take care of pets while you are in the hospital. ? Purchase a rear-facing car seat and make sure you know how to install it in your car. ? Pack your hospital bag. ? Prepare the baby's nursery. Make sure to remove all pillows and stuffed animals from the baby's crib to prevent suffocation.  Visit your dentist if you have not gone during your pregnancy. Use a soft toothbrush to brush your teeth and be gentle when you floss. Contact a health care provider if:  You are unsure if you are in labor or if your water has broken.  You become dizzy.  You have mild pelvic cramps, pelvic pressure, or nagging pain in your abdominal area.  You have lower back pain.  You have persistent  nausea, vomiting, or diarrhea.  You have an unusual or bad smelling vaginal discharge.  You have pain when you urinate. Get help right away if:  Your water breaks before 37 weeks.  You have regular contractions less than 5 minutes apart before 37 weeks.  You have a fever.  You are leaking fluid from your vagina.  You have spotting or bleeding from your vagina.  You have severe abdominal pain or cramping.  You have rapid weight loss or weight gain.    You have shortness of breath with chest pain.  You notice sudden or extreme swelling of your face, hands, ankles, feet, or legs.  Your baby makes fewer than 10 movements in 2 hours.  You have severe headaches that do not go away when you take medicine.  You have vision changes. Summary  The third trimester is from week 28 through week 40, months 7 through 9. The third trimester is a time when the unborn baby (fetus) is growing rapidly.  During the third trimester, your discomfort may increase as you and your baby continue to gain weight. You may have abdominal, leg, and back pain, sleeping problems, and an increased need to urinate.  During the third trimester your breasts will keep growing and they will continue to become tender. A yellow fluid (colostrum) may leak from your breasts. This is the first milk you are producing for your baby.  False labor is a condition in which you feel small, irregular tightenings of the muscles in the womb (contractions) that eventually go away. These are called Braxton Hicks contractions. Contractions may last for hours, days, or even weeks before true labor sets in.  Signs of labor can include: abdominal cramps; regular contractions that start at 10 minutes apart and become stronger and more frequent with time; watery or bloody mucus discharge that comes from the vagina; increased pelvic pressure and dull back pain; and leaking of amniotic fluid. This information is not intended to replace advice  given to you by your health care provider. Make sure you discuss any questions you have with your health care provider. Document Released: 06/08/2001 Document Revised: 11/20/2015 Document Reviewed: 08/15/2012 Elsevier Interactive Patient Education  2017 [employer]  

## 2016-08-31 NOTE — MAU Note (Signed)
Pt presents to MAU with complaints of pain in her vaginal area after she was at work yesterday and stood up really fast. Denies any vaginal bleeding or abnormal discharge

## 2016-08-31 NOTE — MAU Provider Note (Signed)
History     CSN: [id number]  Arrival date and time: 08/31/16 1132   First Provider Initiated Contact with Patient 08/31/16 1422      Chief Complaint  Patient presents with  . Vaginal Pain   [name] is a 26 y.o. [id number] at [redacted]w[redacted]d presenting with symphysis pubis pain of acute onset. She describes standing up suddenly from bending, hearing a "pop" and experiencing pain over SP.  Difficulty walking due to pressure pain. Pain radiates to vagina and associated with lower abdominal cramping. No lateralizing neuroolgic symptoms. No previous similar episodes     Vaginal Pain  The patient's pertinent negatives include no genital itching, genital lesions, vaginal bleeding or vaginal discharge. This is a new problem. The current episode started yesterday. The problem has been waxing and waning. The pain is severe. The problem affects both sides. She is pregnant. Associated symptoms include abdominal pain. Pertinent negatives include no chills, constipation, diarrhea or fever. Associated symptoms comments: crampy lower abdominal pain. The symptoms are aggravated by activity (standing and especially walking). She has tried nothing for the symptoms. She is not sexually active. There is no history of an abdominal surgery or a Cesarean section.    OB History  Gravida Para Term Preterm AB Living  4 1 1  0 2 1  SAB TAB Ectopic Multiple Live Births  1 0 0 0 1    # Outcome Date GA Lbr Len/2nd Weight Sex Delivery Anes PTL Lv  4 Current           3 Term 12/06/13 [redacted]w[redacted]d 10:43 / 00:44 3.181 kg (7 lb 0.2 oz) M Vag-Spont Local, EPI  LIV  2 AB 06/28/09 [redacted]w[redacted]d         1 SAB                Past Medical History:  Diagnosis Date  . BV (bacterial vaginosis)   . Medical history non-contributory   . Trichomonas     Past Surgical History:  Procedure Laterality Date  . DILATION AND CURETTAGE OF UTERUS      Family History  Problem Relation Age of Onset  . Cancer Brother   . Hypertension Maternal  Grandfather   . Diabetes Brother   . Heart disease Neg Hx     Social History  Substance Use Topics  . Smoking status: Former Smoker    Quit date: 04/02/2013  . Smokeless tobacco: Former Neurosurgeon  . Alcohol use No     Comment: occasionally     Allergies:  Allergies  Allergen Reactions  . Latex Rash  . Sulfa Antibiotics Hives    Prescriptions Prior to Admission  Medication Sig Dispense Refill Last Dose  . acetaminophen (TYLENOL) 500 MG tablet Take 1,000 mg by mouth every 6 (six) hours as needed for mild pain, moderate pain or headache.    Taking  . Prenat-FeAsp-Meth-FA-DHA w/o A (PRENATE PIXIE) 10-0.6-0.4-200 MG CAPS Take 1 capsule by mouth at bedtime.   Taking    Review of Systems  Constitutional: Positive for fatigue. Negative for chills and fever.  Gastrointestinal: Positive for abdominal pain. Negative for constipation and diarrhea.  Genitourinary: Positive for vaginal pain. Negative for vaginal discharge.   Physical Exam   Blood pressure 124/77, pulse 94, temperature 98.5 F (36.9 C), resp. rate 18, height 5\' 3"  (1.6 m), weight 92.5 kg (204 lb).  Physical Exam  Nursing note and vitals reviewed. Constitutional: She is oriented to person, place, and time. She appears well-developed and well-nourished. She  appears distressed.  HENT:  Head: Normocephalic.  Eyes: Pupils are equal, round, and reactive to light.  Neck: Normal range of motion.  GI: Soft. There is tenderness. There is no rebound and no guarding.  Term size gravid TTP over SP  Genitourinary:  Genitourinary Comments: SVE: posterior, 1/long/babllotable, cephalic  Musculoskeletal: Normal range of motion.  Neurological: She is alert and oriented to person, place, and time. Coordination normal.  Skin: Skin is warm and dry.  Psychiatric: She has a normal mood and affect. Her behavior is normal. Judgment and thought content normal.    MAU Course  Procedures Results for orders placed or performed during the  hospital encounter of 08/31/16 (from the past 24 hour(s))  Urinalysis, Routine w reflex microscopic     Status: Abnormal   Collection Time: 08/31/16 12:00 PM  Result Value Ref Range   Color, Urine YELLOW YELLOW   APPearance HAZY (A) CLEAR   Specific Gravity, Urine 1.020 1.005 - 1.030   pH 6.0 5.0 - 8.0   Glucose, UA NEGATIVE NEGATIVE mg/dL   Hgb urine dipstick NEGATIVE NEGATIVE   Bilirubin Urine NEGATIVE NEGATIVE   Ketones, ur NEGATIVE NEGATIVE mg/dL   Protein, ur 30 (A) NEGATIVE mg/dL   Nitrite NEGATIVE NEGATIVE   Leukocytes, UA SMALL (A) NEGATIVE   RBC / HPF 0-5 0 - 5 RBC/hpf   WBC, UA 6-30 0 - 5 WBC/hpf   Bacteria, UA FEW (A) NONE SEEN   Squamous Epithelial / LPF 6-30 (A) NONE SEEN   Mucous PRESENT    Fetal monitoring baseline FHR 120-125, accelerations present, no decelerations; occasional mild UC  Acetaminophen 1000mg  given with partial relief  Assessment and Plan   26 yo G4P1021 at 3864w5d 1. Symphysis pubis disruption, initial encounter   FWB by EFM  Counseled on abdominal support binder and rest, slow position changes Work excuse x 2 days  Allergies as of 08/31/2016      Reactions   Latex Rash   Sulfa Antibiotics Hives      Medication List    TAKE these medications   acetaminophen 500 MG tablet Commonly known as:  TYLENOL Take 1,000 mg by mouth every 6 (six) hours as needed for mild pain, moderate pain or headache.   PRENATE PIXIE 10-0.6-0.4-200 MG Caps Take 1 capsule by mouth at bedtime.      Follow-up Information    [facility] Follow up on 09/02/2016.   Specialty:  Obstetrics and Gynecology Contact information: [address] [place] [id number] [phone]         Deirdre Poe 08/31/2016, 2:22 PM

## 2016-09-01 ENCOUNTER — Encounter (INDEPENDENT_AMBULATORY_CARE_PROVIDER_SITE_OTHER): Payer: Self-pay | Admitting: *Deleted

## 2016-09-01 DIAGNOSIS — Z3483 Encounter for supervision of other normal pregnancy, third trimester: Secondary | ICD-10-CM

## 2016-09-02 ENCOUNTER — Ambulatory Visit (INDEPENDENT_AMBULATORY_CARE_PROVIDER_SITE_OTHER): Payer: Medicaid Other | Admitting: Obstetrics & Gynecology

## 2016-09-02 ENCOUNTER — Inpatient Hospital Stay (HOSPITAL_COMMUNITY)
Admission: AD | Admit: 2016-09-02 | Discharge: 2016-09-02 | Disposition: A | Discharge status: Home / Self Care | Payer: Medicaid Other | Source: Ambulatory Visit | Attending: Obstetrics and Gynecology | Admitting: Obstetrics and Gynecology

## 2016-09-02 ENCOUNTER — Encounter (HOSPITAL_COMMUNITY): Payer: Self-pay

## 2016-09-02 VITALS — BP 143/72 | HR 96 | Wt 202.0 lb

## 2016-09-02 DIAGNOSIS — Z3483 Encounter for supervision of other normal pregnancy, third trimester: Secondary | ICD-10-CM

## 2016-09-02 DIAGNOSIS — Z3A40 40 weeks gestation of pregnancy: Secondary | ICD-10-CM | POA: Diagnosis not present

## 2016-09-02 DIAGNOSIS — O99210 Obesity complicating pregnancy, unspecified trimester: Secondary | ICD-10-CM

## 2016-09-02 DIAGNOSIS — E669 Obesity, unspecified: Secondary | ICD-10-CM

## 2016-09-02 DIAGNOSIS — O479 False labor, unspecified: Secondary | ICD-10-CM

## 2016-09-02 DIAGNOSIS — O99213 Obesity complicating pregnancy, third trimester: Secondary | ICD-10-CM

## 2016-09-02 DIAGNOSIS — O471 False labor at or after 37 completed weeks of gestation: Secondary | ICD-10-CM | POA: Diagnosis not present

## 2016-09-02 DIAGNOSIS — O368130 Decreased fetal movements, third trimester, not applicable or unspecified: Secondary | ICD-10-CM | POA: Diagnosis not present

## 2016-09-02 NOTE — MAU Note (Addendum)
Went in to tell patient that I spoke with the provider and she wanted to discharge patient because her cervix had not changed after an hour. Patient became visibly upset and asked to speak with the provider. I told her I would go and get the provider .

## 2016-09-02 NOTE — Progress Notes (Signed)
   PRENATAL VISIT NOTE  Subjective:  [name] HampshireHope S Corine [name] is a 26 y.o. G4P1021 at 7673w0d being seen today for ongoing prenatal care.  She is currently monitored for the following issues for this low-risk pregnancy and has Supervision of normal pregnancy in third trimester; BMI 33.0-33.9,adult; Obesity in pregnancy; and Fetal pyelectasis on her problem list.  Patient reports decreased FM today.  Contractions: Irregular. Vag. Bleeding: None.  Movement: Present. Denies leaking of fluid.   The following portions of the patient's history were reviewed and updated as appropriate: allergies, current medications, past family history, past medical history, past social history, past surgical history and problem list. Problem list updated.  Objective:   Vitals:   09/02/16 1130  BP: (!) 143/72  Pulse: 96  Weight: 202 lb (91.6 kg)    Fetal Status: Fetal Heart Rate (bpm): 141   Movement: Present     General:  Alert, oriented and cooperative. Patient is in no acute distress.  Skin: Skin is warm and dry. No rash noted.   Cardiovascular: Normal heart rate noted  Respiratory: Normal respiratory effort, no problems with respiration noted  Abdomen: Soft, gravid, appropriate for gestational age. Pain/Pressure: Present     Pelvic:  Cervical exam performed        Extremities: Normal range of motion.  Edema: Trace  Mental Status: Normal mood and affect. Normal behavior. Normal judgment and thought content.   Assessment and Plan:  Pregnancy: G4P1021 at 6473w0d  1. Encounter for supervision of other normal pregnancy in third trimester - IOL is scheduled for [redacted] weeks EGA  2. Obesity in pregnancy   Term labor symptoms and general obstetric precautions including but not limited to vaginal bleeding, contractions, leaking of fluid and fetal movement were reviewed in detail with the patient. Please refer to After Visit Summary for other counseling recommendations.  No Follow-up on file.   Allie BossierMyra C Samanvitha Germany, MD

## 2016-09-02 NOTE — Addendum Note (Signed)
Addended by: Gita KudoLASSITER, Jiovanna Frei S on: 09/02/2016 11:54 AM   Modules accepted: Orders

## 2016-09-02 NOTE — MAU Note (Signed)
Pt seen by MD office today, is 4 cm's, instructed to come to MAU.  Denies bleeding or LOF.  Contractions since 1100 today.

## 2016-09-02 NOTE — MAU Note (Signed)
I have communicated with Dr. Omer JackMumaw and reviewed vital signs:  Vitals:   09/02/16 1804  BP: 128/80  Pulse: 96  Resp: 18  Temp: 98 F (36.7 C)    Vaginal exam:  Dilation: 4 Effacement (%): 80 Cervical Position: Middle Station: -2 Presentation: Vertex Exam by:: Kayren EavesAshley Silveria Botz RN ,   Also reviewed contraction pattern and that non-stress test is reactive.  It has been documented that patient is contracting occasionally with nocervical change over 1 hour not indicating active labor.  Patient denies any other complaints.  Based on this report provider has given order for discharge.  A discharge order and diagnosis entered by a provider.   Labor discharge instructions reviewed with patient.

## 2016-09-02 NOTE — MAU Note (Signed)
Patient came out of the room dressed and crying. I explained to the patient that I was waiting on the provider to get out of another room so she could come in and speak with the patient. Patient said she didn't want to speak to anyone and that she was leaving. I was unable to get vitals on patient or give her discharge instructions because of this.

## 2016-09-03 ENCOUNTER — Encounter (HOSPITAL_COMMUNITY): Payer: Self-pay

## 2016-09-03 ENCOUNTER — Inpatient Hospital Stay (HOSPITAL_COMMUNITY)
Admission: AD | Admit: 2016-09-03 | Discharge: 2016-09-03 | Disposition: A | Discharge status: Home / Self Care | Payer: Medicaid Other | Source: Ambulatory Visit | Attending: Obstetrics and Gynecology | Admitting: Obstetrics and Gynecology

## 2016-09-03 DIAGNOSIS — O479 False labor, unspecified: Secondary | ICD-10-CM

## 2016-09-03 NOTE — MAU Note (Signed)
I have communicated with Cathie BeamsFran [name] CNM and reviewed vital signs:  Vitals:   09/03/16 0236  BP: 135/83  Pulse: 85  Resp: 20  Temp: 97.8 F (36.6 C)    Vaginal exam:  Dilation: 4 Effacement (%): 80 Cervical Position: Middle Station: -2 Presentation: Vertex Exam by:: Lysle Dingwall. [name] Reber RN,   Also reviewed contraction pattern and that non-stress test is reactive. Provider reviewed tracing at bedside. It has been documented that patient is contracting every 7-9 minutes with no cervical change over 1 hour not indicating active labor.  Patient denies any other complaints.  Based on this report provider has given order for discharge.  A discharge order and diagnosis entered by a provider.   Labor discharge instructions reviewed with patient.

## 2016-09-03 NOTE — MAU Note (Signed)
Pt reports contractions that got worse tonight. Rates 9/10. Denies LOF. Lost mucous plug. No vaginal bleeding. States she has not felt baby move since she left here earlier tonight around 7pm.

## 2016-09-03 NOTE — MAU Note (Signed)
Cathie BeamsFran Cresenzo-Dishmon CNM at bedside.

## 2016-09-03 NOTE — Discharge Instructions (Signed)
Braxton Hicks Contractions °Contractions of the uterus can occur throughout pregnancy, but they are not always a sign that you are in labor. You may have practice contractions called Braxton Hicks contractions. These false labor contractions are sometimes confused with true labor. °What are Braxton Hicks contractions? °Braxton Hicks contractions are tightening movements that occur in the muscles of the uterus before labor. Unlike true labor contractions, these contractions do not result in opening (dilation) and thinning of the cervix. Toward the end of pregnancy (32-34 weeks), Braxton Hicks contractions can happen more often and may become stronger. These contractions are sometimes difficult to tell apart from true labor because they can be very uncomfortable. You should not feel embarrassed if you go to the hospital with false labor. °Sometimes, the only way to tell if you are in true labor is for your health care provider to look for changes in the cervix. The health care provider will do a physical exam and may monitor your contractions. If you are not in true labor, the exam should show that your cervix is not dilating and your water has not broken. °If there are no prenatal problems or other health problems associated with your pregnancy, it is completely safe for you to be sent home with false labor. You may continue to have Braxton Hicks contractions until you go into true labor. °How can I tell the difference between true labor and false labor? °· Differences °¨ False labor °¨ Contractions last 30-70 seconds.: Contractions are usually shorter and not as strong as true labor contractions. °¨ Contractions become very regular.: Contractions are usually irregular. °¨ Discomfort is usually felt in the top of the uterus, and it spreads to the lower abdomen and low back.: Contractions are often felt in the front of the lower abdomen and in the groin. °¨ Contractions do not go away with walking.: Contractions may  go away when you walk around or change positions while lying down. °¨ Contractions usually become more intense and increase in frequency.: Contractions get weaker and are shorter-lasting as time goes on. °¨ The cervix dilates and gets thinner.: The cervix usually does not dilate or become thin. °Follow these instructions at home: °¨ Take over-the-counter and prescription medicines only as told by your health care provider. °¨ Keep up with your usual exercises and follow other instructions from your health care provider. °¨ Eat and drink lightly if you think you are going into labor. °¨ If Braxton Hicks contractions are making you uncomfortable: °¨ Change your position from lying down or resting to walking, or change from walking to resting. °¨ Sit and rest in a tub of warm water. °¨ Drink enough fluid to keep your urine clear or pale yellow. Dehydration may cause these contractions. °¨ Do slow and deep breathing several times an hour. °¨ Keep all follow-up prenatal visits as told by your health care provider. This is important. °Contact a health care provider if: °¨ You have a fever. °¨ You have continuous pain in your abdomen. °Get help right away if: °¨ Your contractions become stronger, more regular, and closer together. °¨ You have fluid leaking or gushing from your vagina. °¨ You pass blood-tinged mucus (bloody show). °¨ You have bleeding from your vagina. °¨ You have low back pain that you never had before. °¨ You feel your baby’s head pushing down and causing pelvic pressure. °¨ Your baby is not moving inside you as much as it used to. °Summary °¨ Contractions that occur before labor are   called Braxton Hicks contractions, false labor, or practice contractions.  Braxton Hicks contractions are usually shorter, weaker, farther apart, and less regular than true labor contractions. True labor contractions usually become progressively stronger and regular and they become more frequent.  Manage discomfort from  [place] contractions by changing position, resting in a warm bath, drinking plenty of water, or practicing deep breathing. This information is not intended to replace advice given to you by your health care provider. Make sure you discuss any questions you have with your health care provider. Document Released: 06/14/2005 Document Revised: 05/03/2016 Document Reviewed: 05/03/2016 Elsevier Interactive Patient Education  2017 [employer]vier Inc.  Fetal Movement Counts Patient Name: ________________________________________________ Patient Due Date: ____________________ What is a fetal movement count? A fetal movement count is the number of times that you feel your baby move during a certain amount of time. This may also be called a fetal kick count. A fetal movement count is recommended for every pregnant woman. You may be asked to start counting fetal movements as early as week 28 of your pregnancy. Pay attention to when your baby is most active. You may notice your baby's sleep and wake cycles. You may also notice things that make your baby move more. You should do a fetal movement count: When your baby is normally most active. At the same time each day. A good time to count movements is while you are resting, after having something to eat and drink. How do I count fetal movements? Find a quiet, comfortable area. Sit, or lie down on your side. Write down the date, the start time and stop time, and the number of movements that you felt between those two times. Take this information with you to your health care visits. For 2 hours, count kicks, flutters, swishes, rolls, and jabs. You should feel at least 10 movements during 2 hours. You may stop counting after you have felt 10 movements. If you do not feel 10 movements in 2 hours, have something to eat and drink. Then, keep resting and counting for 1 hour. If you feel at least 4 movements during that hour, you may stop counting. Contact a health  care provider if: You feel fewer than 4 movements in 2 hours. Your baby is not moving like he or she usually does. Date: ____________ Start time: ____________ Stop time: ____________ Movements: ____________ Date: ____________ Start time: ____________ Stop time: ____________ Movements: ____________ Date: ____________ Start time: ____________ Stop time: ____________ Movements: ____________ Date: ____________ Start time: ____________ Stop time: ____________ Movements: ____________ Date: ____________ Start time: ____________ Stop time: ____________ Movements: ____________ Date: ____________ Start time: ____________ Stop time: ____________ Movements: ____________ Date: ____________ Start time: ____________ Stop time: ____________ Movements: ____________ Date: ____________ Start time: ____________ Stop time: ____________ Movements: ____________ Date: ____________ Start time: ____________ Stop time: ____________ Movements: ____________ This information is not intended to replace advice given to you by your health care provider. Make sure you discuss any questions you have with your health care provider. Document Released: 07/14/2006 Document Revised: 02/11/2016 Document Reviewed: 07/24/2015 Elsevier Interactive Patient Education  2017 [employer]

## 2016-09-03 NOTE — MAU Note (Signed)
Pt presents complaining of worsening contractions since leaving MAU. Denies leaking or bleeding.

## 2016-09-04 ENCOUNTER — Encounter (HOSPITAL_COMMUNITY): Payer: Self-pay

## 2016-09-04 ENCOUNTER — Encounter (HOSPITAL_COMMUNITY): Payer: Self-pay | Admitting: Anesthesiology

## 2016-09-04 ENCOUNTER — Encounter (HOSPITAL_COMMUNITY): Payer: Self-pay | Admitting: *Deleted

## 2016-09-04 ENCOUNTER — Inpatient Hospital Stay (HOSPITAL_COMMUNITY)
Admission: AD | Admit: 2016-09-04 | Discharge: 2016-09-06 | DRG: 774 | Disposition: A | Discharge status: Home / Self Care | Payer: Medicaid Other | Source: Ambulatory Visit | Attending: Obstetrics & Gynecology | Admitting: Obstetrics & Gynecology

## 2016-09-04 ENCOUNTER — Inpatient Hospital Stay (HOSPITAL_COMMUNITY)
Admission: AD | Admit: 2016-09-04 | Discharge: 2016-09-04 | Disposition: A | Discharge status: Home / Self Care | Payer: Medicaid Other | Source: Ambulatory Visit | Attending: Obstetrics & Gynecology | Admitting: Obstetrics & Gynecology

## 2016-09-04 DIAGNOSIS — Z833 Family history of diabetes mellitus: Secondary | ICD-10-CM | POA: Diagnosis not present

## 2016-09-04 DIAGNOSIS — Z8249 Family history of ischemic heart disease and other diseases of the circulatory system: Secondary | ICD-10-CM

## 2016-09-04 DIAGNOSIS — Z3493 Encounter for supervision of normal pregnancy, unspecified, third trimester: Secondary | ICD-10-CM | POA: Diagnosis present

## 2016-09-04 DIAGNOSIS — Z3A40 40 weeks gestation of pregnancy: Secondary | ICD-10-CM

## 2016-09-04 DIAGNOSIS — Z87891 Personal history of nicotine dependence: Secondary | ICD-10-CM

## 2016-09-04 DIAGNOSIS — O479 False labor, unspecified: Secondary | ICD-10-CM

## 2016-09-04 LAB — CBC
HEMATOCRIT: 30.3 % — AB (ref 36.0–46.0)
Hemoglobin: 10.1 g/dL — ABNORMAL LOW (ref 12.0–15.0)
MCH: 31.1 pg (ref 26.0–34.0)
MCHC: 33.3 g/dL (ref 30.0–36.0)
MCV: 93.2 fL (ref 78.0–100.0)
PLATELETS: 280 10*3/uL (ref 150–400)
RBC: 3.25 MIL/uL — ABNORMAL LOW (ref 3.87–5.11)
RDW: 13.6 % (ref 11.5–15.5)
WBC: 14.8 10*3/uL — AB (ref 4.0–10.5)

## 2016-09-04 LAB — POCT FERN TEST: POCT Fern Test: NEGATIVE

## 2016-09-04 MED ORDER — EPHEDRINE 5 MG/ML INJ
10.0000 mg | INTRAVENOUS | Status: DC | PRN
Start: 2016-09-04 — End: 2016-09-05
  Filled 2016-09-04: qty 4

## 2016-09-04 MED ORDER — PHENYLEPHRINE 40 MCG/ML (10ML) SYRINGE FOR IV PUSH (FOR BLOOD PRESSURE SUPPORT)
80.0000 ug | PREFILLED_SYRINGE | INTRAVENOUS | Status: DC | PRN
Start: 2016-09-04 — End: 2016-09-05
  Filled 2016-09-04: qty 5

## 2016-09-04 MED ORDER — LACTATED RINGERS IV SOLN
500.0000 mL | Freq: Once | INTRAVENOUS | Status: AC
  Administered 2016-09-04: 500 mL via INTRAVENOUS

## 2016-09-04 MED ORDER — LACTATED RINGERS IV SOLN: 500.0000 mL | INTRAVENOUS | Status: DC | PRN

## 2016-09-04 MED ORDER — ACETAMINOPHEN 325 MG PO TABS
650.0000 mg | ORAL_TABLET | ORAL | Status: DC | PRN
  Administered 2016-09-05: 650 mg via ORAL
  Filled 2016-09-04: qty 2

## 2016-09-04 MED ORDER — FENTANYL 2.5 MCG/ML BUPIVACAINE 1/10 % EPIDURAL INFUSION (WH - ANES): 14.0000 mL/h | INTRAMUSCULAR | Status: DC | PRN

## 2016-09-04 MED ORDER — FLEET ENEMA 7-19 GM/118ML RE ENEM: 1.0000 | ENEMA | RECTAL | Status: DC | PRN

## 2016-09-04 MED ORDER — LACTATED RINGERS IV SOLN
INTRAVENOUS | Status: DC
  Administered 2016-09-04: 20:00:00 via INTRAVENOUS

## 2016-09-04 MED ORDER — PHENYLEPHRINE 40 MCG/ML (10ML) SYRINGE FOR IV PUSH (FOR BLOOD PRESSURE SUPPORT)
80.0000 ug | PREFILLED_SYRINGE | INTRAVENOUS | Status: DC | PRN
  Filled 2016-09-04: qty 5

## 2016-09-04 MED ORDER — OXYTOCIN BOLUS FROM INFUSION
500.0000 mL | Freq: Once | INTRAVENOUS | Status: AC
  Administered 2016-09-05: 500 mL via INTRAVENOUS

## 2016-09-04 MED ORDER — FENTANYL 2.5 MCG/ML BUPIVACAINE 1/10 % EPIDURAL INFUSION (WH - ANES)
INTRAMUSCULAR | Status: AC
  Filled 2016-09-04: qty 100

## 2016-09-04 MED ORDER — LIDOCAINE HCL (PF) 1 % IJ SOLN
30.0000 mL | INTRAMUSCULAR | Status: DC | PRN
  Filled 2016-09-04: qty 30

## 2016-09-04 MED ORDER — EPHEDRINE 5 MG/ML INJ
10.0000 mg | INTRAVENOUS | Status: DC | PRN
  Filled 2016-09-04: qty 4

## 2016-09-04 MED ORDER — OXYTOCIN 40 UNITS IN LACTATED RINGERS INFUSION - SIMPLE MED
2.5000 [IU]/h | INTRAVENOUS | Status: DC
  Filled 2016-09-04: qty 1000

## 2016-09-04 MED ORDER — SOD CITRATE-CITRIC ACID 500-334 MG/5ML PO SOLN: 30.0000 mL | ORAL | Status: DC | PRN

## 2016-09-04 MED ORDER — ONDANSETRON HCL 4 MG/2ML IJ SOLN
4.0000 mg | Freq: Four times a day (QID) | INTRAMUSCULAR | Status: DC | PRN
  Administered 2016-09-04: 4 mg via INTRAVENOUS
  Filled 2016-09-04: qty 2

## 2016-09-04 MED ORDER — PHENYLEPHRINE 40 MCG/ML (10ML) SYRINGE FOR IV PUSH (FOR BLOOD PRESSURE SUPPORT)
PREFILLED_SYRINGE | INTRAVENOUS | Status: AC
  Filled 2016-09-04: qty 10

## 2016-09-04 MED ORDER — DIPHENHYDRAMINE HCL 50 MG/ML IJ SOLN: 12.5000 mg | INTRAMUSCULAR | Status: DC | PRN

## 2016-09-04 MED ORDER — OXYCODONE-ACETAMINOPHEN 5-325 MG PO TABS: 2.0000 | ORAL_TABLET | ORAL | Status: DC | PRN

## 2016-09-04 MED ORDER — OXYCODONE-ACETAMINOPHEN 5-325 MG PO TABS: 1.0000 | ORAL_TABLET | ORAL | Status: DC | PRN

## 2016-09-04 NOTE — MAU Note (Signed)
Started leaking at 0330, clear fluid, still leaking. Is contracting ~q565min.  "pretty intense".  Was 4 cm yesterday.

## 2016-09-04 NOTE — MAU Note (Signed)
I have communicated with Dr. Omer JackMumaw and reviewed vital signs:  Vitals:   09/04/16 0725 09/04/16 1029  BP: 129/64 130/78  Pulse: 98 91  Resp: 18 18  Temp: 98.1 F (36.7 C)     Vaginal exam:  Dilation: 4.5 Effacement (%): 70, 80 Cervical Position: Posterior Station: -2 Presentation: Vertex Exam by:: Dr. Omer JackMumaw,   Also reviewed contraction pattern and that non-stress test is reactive.  It has been documented that patient is contracting every 10 minutes with no cervical change over 3 hours not indicating active labor.  Patient denies any other complaints.  Based on this report provider has given order for discharge.  A discharge order and diagnosis entered by a provider.   Labor discharge instructions reviewed with patient.

## 2016-09-04 NOTE — H&P (Signed)
LABOR AND DELIVERY ADMISSION HISTORY AND PHYSICAL NOTE  [name] is a 26 y.o. female [phone] with IUP at [redacted]w[redacted]d presenting for SOL.   She reports positive fetal movement. She denies leakage of fluid or vaginal bleeding.  Prenatal History/Complications:  Past Medical History: Past Medical History:  Diagnosis Date  . BV (bacterial vaginosis)   . Trichomonas     Past Surgical History: Past Surgical History:  Procedure Laterality Date  . DILATION AND CURETTAGE OF UTERUS      Obstetrical History: OB History    Gravida Para Term Preterm AB Living   4 1 1  0 2 1   SAB TAB Ectopic Multiple Live Births   1 0 0 0 1    Fetal pyelectasis in current pregnancy. EFW 44% percentile at 33w  Social History: Social History   Social History  . Marital status: Single    Spouse name: N/A  . Number of children: N/A  . Years of education: N/A   Social History Main Topics  . Smoking status: Former Smoker    Quit date: 04/02/2013  . Smokeless tobacco: Former Neurosurgeon  . Alcohol use No     Comment: occasionally   . Drug use: No  . Sexual activity: Yes    Partners: Male    Birth control/ protection: None   Other Topics Concern  . None   Social History Narrative  . None    Family History: Family History  Problem Relation Age of Onset  . Cancer Brother   . Hypertension Maternal Grandfather   . Diabetes Brother   . Heart disease Neg Hx     Allergies: Allergies  Allergen Reactions  . Latex Rash  . Sulfa Antibiotics Hives    Prescriptions Prior to Admission  Medication Sig Dispense Refill Last Dose  . acetaminophen (TYLENOL) 500 MG tablet Take 1,000 mg by mouth every 6 (six) hours as needed for mild pain, moderate pain or headache.    09/04/2016 at Unknown time  . Prenat-FeAsp-Meth-FA-DHA w/o A (PRENATE PIXIE) 10-0.6-0.4-200 MG CAPS Take 1 capsule by mouth at bedtime.   Past Week at Unknown time     Review of Systems   All systems reviewed and negative except as stated  in HPI  Physical Exam Blood pressure 113/88, pulse 92, temperature 98.2 F (36.8 C), temperature source Oral, resp. rate 18, height 5\' 3"  (1.6 m), weight 205 lb (93 kg), SpO2 100 %. General appearance: alert Lungs: clear to auscultation bilaterally Heart: regular rate and rhythm Abdomen: soft, non-tender; bowel sounds normal Extremities: No calf swelling or tenderness Presentation: cephalic Fetal monitoring: baseline 140, moderate variability, acels+, no decels Uterine activity: regular contractions, appropriate Dilation: 7 Effacement (%): 90 Station: 0 Exam by:: Remigio Eisenmenger RN   Prenatal labs: ABO, Rh: --/--/A POS (03/10 1950) Antibody: PENDING (03/10 1950) Rubella: !Error! RPR: NON REAC (12/19 0907)  HBsAg: NEGATIVE (09/18 1449)  HIV: NONREACTIVE (12/19 0907)  GBS: Negative (02/10 0000)  1 hr Glucola: 93 Genetic screening:  normal Anatomy US: UTD A2-3 on right (renal pelvis measured 11 mms with  central calyceal dilation); left kidney normal (07/20/16)  Prenatal Transfer Tool  Maternal Diabetes: No Genetic Screening: Normal Maternal Ultrasounds/Referrals: Abnormal:  Findings:   Other: as above Fetal Ultrasounds or other Referrals:  Referred to Materal Fetal Medicine  Maternal Substance Abuse:  No Significant Maternal Medications:  None Significant Maternal Lab Results: GBS negative  Results for orders placed or performed during the hospital encounter of 09/04/16 (from the  past 24 hour(s))  CBC   Collection Time: 09/04/16  9:50 AM  Result Value Ref Range   WBC 14.8 (H) 4.0 - 10.5 K/uL   RBC 3.25 (L) 3.87 - 5.11 MIL/uL   Hemoglobin 10.1 (L) 12.0 - 15.0 g/dL   HCT 16.130.3 (L) 09.636.0 - 04.546.0 %   MCV 93.2 78.0 - 100.0 fL   MCH 31.1 26.0 - 34.0 pg   MCHC 33.3 30.0 - 36.0 g/dL   RDW 40.913.6 81.111.5 - 91.415.5 %   Platelets 280 150 - 400 K/uL  Type and screen [facility]   Collection Time: 09/04/16  7:50 PM  Result Value Ref Range   ABO/RH(D) A POS    Antibody  Screen PENDING    Sample Expiration 09/07/2016   Results for orders placed or performed during the hospital encounter of 09/04/16 (from the past 24 hour(s))  POCT fern test   Collection Time: 09/04/16  8:13 AM  Result Value Ref Range   POCT Fern Test Negative = intact amniotic membranes     Patient Active Problem List   Diagnosis Date Noted  . Normal labor 09/04/2016  . Fetal pyelectasis 04/19/2016  . BMI 33.0-33.9,adult 03/15/2016  . Obesity in pregnancy 03/15/2016  . Supervision of normal pregnancy in third trimester 05/03/2013    Assessment: [name] is a 26 y.o. G4P1021 at 5614w2d here for SOL  #Labor:Active #Pain: epidural #FWB: Cat I #ID:  GBS neg #MOF: breast #MOC:undecided #Circ:  Yes, outpatient  Adair LaundryAna Carvalho do [place], MD PGY1 09/04/2016, 9:16 PM   OB FELLOW HISTORY AND PHYSICAL ATTESTATION  I have seen and examined this patient; I agree with above documentation in the resident's note.    Jen MowElizabeth Mumaw, DO [place] Fellow 09/04/2016

## 2016-09-04 NOTE — MAU Provider Note (Signed)
History   [id number]   Chief Complaint  Patient presents with  . Labor Eval    HPI [name] is a 26 y.o. female  [phone] here with report of watery vaginal discharge that began at approximately 0300.  Leaking of fluid has continued.  Pt reports contractions and denies vaginal bleeding. Positive fetal movement.   All other systems negative.    No LMP recorded (lmp unknown). Patient is pregnant.  OB History  Gravida Para Term Preterm AB Living  4 1 1  0 2 1  SAB TAB Ectopic Multiple Live Births  1 0 0 0 1    # Outcome Date GA Lbr Len/2nd Weight Sex Delivery Anes PTL Lv  4 Current           3 Term 12/06/13 4974w2d 10:43 / 00:44 7 lb 0.2 oz (3.181 kg) M Vag-Spont Local, EPI  LIV  2 AB 06/28/09 4043w0d         1 SAB               Past Medical History:  Diagnosis Date  . BV (bacterial vaginosis)   . Medical history non-contributory   . Trichomonas     Family History  Problem Relation Age of Onset  . Cancer Brother   . Hypertension Maternal Grandfather   . Diabetes Brother   . Heart disease Neg Hx     Social History   Social History  . Marital status: Single    Spouse name: N/A  . Number of children: N/A  . Years of education: N/A   Social History Main Topics  . Smoking status: Former Smoker    Quit date: 04/02/2013  . Smokeless tobacco: Former NeurosurgeonUser  . Alcohol use No     Comment: occasionally   . Drug use: No  . Sexual activity: Yes    Partners: Male    Birth control/ protection: None   Other Topics Concern  . None   Social History Narrative  . None    Allergies  Allergen Reactions  . Latex Rash  . Sulfa Antibiotics Hives    No current facility-administered medications on file prior to encounter.    Current Outpatient Prescriptions on File Prior to Encounter  Medication Sig Dispense Refill  . acetaminophen (TYLENOL) 500 MG tablet Take 1,000 mg by mouth every 6 (six) hours as needed for mild pain, moderate pain or headache.     .  Prenat-FeAsp-Meth-FA-DHA w/o A (PRENATE PIXIE) 10-0.6-0.4-200 MG CAPS Take 1 capsule by mouth at bedtime.       Review of Systems  Constitutional: Negative.   Gastrointestinal: Positive for abdominal pain. Negative for diarrhea and vomiting.  Genitourinary: Positive for vaginal discharge. Negative for vaginal bleeding.   Physical Exam   Vitals:   09/04/16 0714 09/04/16 0725  BP:  129/64  Pulse:  98  Resp:  18  Temp:  98.1 F (36.7 C)  TempSrc:  Oral  SpO2:  99%  Weight: 202 lb 12 oz (92 kg)   Height:  5\' 3"  (1.6 m)    Physical Exam  Nursing note and vitals reviewed. Constitutional: She is oriented to person, place, and time. She appears well-developed and well-nourished. No distress.  HENT:  Head: Normocephalic and atraumatic.  Eyes: Conjunctivae are normal. Right eye exhibits no discharge. Left eye exhibits no discharge. No scleral icterus.  Neck: Normal range of motion.  Respiratory: Effort normal. No respiratory distress.  Genitourinary: Vaginal discharge (small amount of yellow mucoid discharge, no  pooling) found.  Neurological: She is alert and oriented to person, place, and time.  Skin: Skin is warm and dry. She is not diaphoretic.  Psychiatric: She has a normal mood and affect. Her behavior is normal. Judgment and thought content normal.   Fetal Tracing:  Baseline: 130 Variability: moderate Accelerations: 15x15 Decelerations: none  Toco: irr ctx MAU Course  Procedures Results for orders placed or performed during the hospital encounter of 09/04/16 (from the past 24 hour(s))  POCT fern test     Status: None   Collection Time: 09/04/16  8:13 AM  Result Value Ref Range   POCT Fern Test Negative = intact amniotic membranes     MDM Category 1 tracing No pooling , Fern negative  Assessment and Plan  A: Vaginal discharge in third trimester of pregnancy  P: RN will continue as labor eval & discuss plan of care with oncoming provider based on cervical  exam   Judeth Horn, NP 09/04/2016 7:37 AM

## 2016-09-04 NOTE — MAU Note (Signed)
Seen here earlier for labor eval and sent home. Ctxs stronger since 1500. Denies LOF or bleeding. 4-5cm last ck.

## 2016-09-04 NOTE — Anesthesia Preprocedure Evaluation (Signed)
Anesthesia Evaluation  Patient identified by MRN, date of birth, ID band Patient awake    Reviewed: Allergy & Precautions, H&P , NPO status , Patient's Chart, lab work & pertinent test results  Airway Mallampati: I  TM Distance: >3 FB Neck ROM: full    Dental no notable dental hx.    Pulmonary former smoker,    Pulmonary exam normal        Cardiovascular negative cardio ROS Normal cardiovascular exam     Neuro/Psych negative neurological ROS  negative psych ROS   GI/Hepatic negative GI ROS, Neg liver ROS,   Endo/Other  negative endocrine ROS  Renal/GU      Musculoskeletal   Abdominal (+) + obese,   Peds  Hematology negative hematology ROS (+)   Anesthesia Other Findings   Reproductive/Obstetrics (+) Pregnancy                             Anesthesia Physical Anesthesia Plan  ASA: II  Anesthesia Plan: Epidural   Post-op Pain Management:    Induction:   Airway Management Planned:   Additional Equipment:   Intra-op Plan:   Post-operative Plan:   Informed Consent: I have reviewed the patients History and Physical, chart, labs and discussed the procedure including the risks, benefits and alternatives for the proposed anesthesia with the patient or authorized representative who has indicated his/her understanding and acceptance.     Plan Discussed with:   Anesthesia Plan Comments:         Anesthesia Quick Evaluation

## 2016-09-04 NOTE — Anesthesia Pain Management Evaluation Note (Signed)
  CRNA Pain Management Visit Note  Patient: [name], 26 y.o., female  "Hello I am a member of the anesthesia [facility]. We have an anesthesia team available at all times to provide care throughout the hospital, including epidural management and anesthesia for C-section. I don't know your plan for the delivery whether it a natural birth, water birth, IV sedation, nitrous supplementation, doula or epidural, but we want to meet your pain goals."   1.Was your pain managed to your expectations on prior hospitalizations?   Yes   2.What is your expectation for pain management during this hospitalization?     Epidural  3.How can we help you reach that goal? Epidural in place  Record the patient's initial score and the patient's pain goal.   Pain: 0  Pain Goal: 5 [facility] wants you to be able to say your pain was always managed very well.  Mirca Yale 09/04/2016

## 2016-09-04 NOTE — Discharge Instructions (Signed)

## 2016-09-05 ENCOUNTER — Encounter (HOSPITAL_COMMUNITY): Payer: Self-pay

## 2016-09-05 DIAGNOSIS — Z3A40 40 weeks gestation of pregnancy: Secondary | ICD-10-CM

## 2016-09-05 LAB — RPR: RPR: NONREACTIVE

## 2016-09-05 MED ORDER — ZOLPIDEM TARTRATE 5 MG PO TABS: 5.0000 mg | ORAL_TABLET | Freq: Every evening | ORAL | Status: DC | PRN

## 2016-09-05 MED ORDER — COCONUT OIL OIL: 1.0000 "application " | TOPICAL_OIL | Status: DC | PRN

## 2016-09-05 MED ORDER — IBUPROFEN 600 MG PO TABS
600.0000 mg | ORAL_TABLET | Freq: Four times a day (QID) | ORAL | Status: DC
  Administered 2016-09-05 – 2016-09-06 (×6): 600 mg via ORAL
  Filled 2016-09-05 (×6): qty 1

## 2016-09-05 MED ORDER — DIPHENHYDRAMINE HCL 25 MG PO CAPS: 25.0000 mg | ORAL_CAPSULE | Freq: Four times a day (QID) | ORAL | Status: DC | PRN

## 2016-09-05 MED ORDER — MISOPROSTOL 200 MCG PO TABS
ORAL_TABLET | ORAL | Status: AC
  Filled 2016-09-05: qty 5

## 2016-09-05 MED ORDER — METHYLERGONOVINE MALEATE 0.2 MG/ML IJ SOLN
0.2000 mg | Freq: Once | INTRAMUSCULAR | Status: AC
  Administered 2016-09-05: 0.2 mg via INTRAMUSCULAR

## 2016-09-05 MED ORDER — OXYCODONE HCL 5 MG PO TABS
5.0000 mg | ORAL_TABLET | ORAL | Status: DC | PRN
  Administered 2016-09-05: 5 mg via ORAL
  Filled 2016-09-05: qty 1

## 2016-09-05 MED ORDER — SENNOSIDES-DOCUSATE SODIUM 8.6-50 MG PO TABS
2.0000 | ORAL_TABLET | ORAL | Status: DC
  Administered 2016-09-05: 2 via ORAL
  Filled 2016-09-05: qty 2

## 2016-09-05 MED ORDER — SIMETHICONE 80 MG PO CHEW: 80.0000 mg | CHEWABLE_TABLET | ORAL | Status: DC | PRN

## 2016-09-05 MED ORDER — TETANUS-DIPHTH-ACELL PERTUSSIS 5-2.5-18.5 LF-MCG/0.5 IM SUSP: 0.5000 mL | Freq: Once | INTRAMUSCULAR | Status: DC

## 2016-09-05 MED ORDER — METHYLERGONOVINE MALEATE 0.2 MG/ML IJ SOLN
INTRAMUSCULAR | Status: AC
  Filled 2016-09-05: qty 1

## 2016-09-05 MED ORDER — BENZOCAINE-MENTHOL 20-0.5 % EX AERO
1.0000 "application " | INHALATION_SPRAY | CUTANEOUS | Status: DC | PRN
  Administered 2016-09-05: 1 via TOPICAL
  Filled 2016-09-05: qty 56

## 2016-09-05 MED ORDER — TERBUTALINE SULFATE 1 MG/ML IJ SOLN
0.2500 mg | Freq: Once | INTRAMUSCULAR | Status: DC | PRN
  Filled 2016-09-05: qty 1

## 2016-09-05 MED ORDER — WITCH HAZEL-GLYCERIN EX PADS: 1.0000 "application " | MEDICATED_PAD | CUTANEOUS | Status: DC | PRN

## 2016-09-05 MED ORDER — OXYTOCIN 40 UNITS IN LACTATED RINGERS INFUSION - SIMPLE MED
1.0000 m[IU]/min | INTRAVENOUS | Status: DC
  Administered 2016-09-05: 2 m[IU]/min via INTRAVENOUS

## 2016-09-05 MED ORDER — METHYLERGONOVINE MALEATE 0.2 MG PO TABS: 0.2000 mg | ORAL_TABLET | ORAL | Status: DC

## 2016-09-05 MED ORDER — METHYLERGONOVINE MALEATE 0.2 MG PO TABS
0.2000 mg | ORAL_TABLET | ORAL | Status: AC
  Administered 2016-09-05 (×2): 0.2 mg via ORAL
  Filled 2016-09-05 (×2): qty 1

## 2016-09-05 MED ORDER — MISOPROSTOL 200 MCG PO TABS
1000.0000 ug | ORAL_TABLET | Freq: Once | ORAL | Status: AC
  Administered 2016-09-05: 1000 ug via RECTAL

## 2016-09-05 MED ORDER — ONDANSETRON HCL 4 MG PO TABS: 4.0000 mg | ORAL_TABLET | ORAL | Status: DC | PRN

## 2016-09-05 MED ORDER — PRENATAL MULTIVITAMIN CH
1.0000 | ORAL_TABLET | Freq: Every day | ORAL | Status: DC
  Administered 2016-09-05 – 2016-09-06 (×2): 1 via ORAL
  Filled 2016-09-05 (×2): qty 1

## 2016-09-05 MED ORDER — ONDANSETRON HCL 4 MG/2ML IJ SOLN: 4.0000 mg | INTRAMUSCULAR | Status: DC | PRN

## 2016-09-05 MED ORDER — ACETAMINOPHEN 325 MG PO TABS
650.0000 mg | ORAL_TABLET | ORAL | Status: DC | PRN
  Administered 2016-09-05 (×3): 650 mg via ORAL
  Filled 2016-09-05 (×3): qty 2

## 2016-09-05 MED ORDER — DIBUCAINE 1 % RE OINT
1.0000 "application " | TOPICAL_OINTMENT | RECTAL | Status: DC | PRN
  Administered 2016-09-05: 1 via RECTAL
  Filled 2016-09-05: qty 28

## 2016-09-05 NOTE — Progress Notes (Signed)
Patient ID: Diana EvesHope S Depaul, female   DOB: 1990-10-03, 26 y.o.   MRN: [id number]  S: Patient seen & examined for progress of labor. Patient comfortable with epidural.     O:  Vitals:   09/04/16 2121 09/04/16 2131 09/04/16 2301 09/04/16 2331  BP: (!) 111/54 (!) 142/112 125/65 124/72  Pulse: 90 100 82 87  Resp:      Temp:      TempSrc:      SpO2:      Weight:      Height:        Dilation: 6 Effacement (%): 100 Cervical Position: Middle Station: -1 Presentation: Vertex Exam by:: Marvell FullerPamela Rumley,RN  Bulging bag felt, AROM performed, clear fluid returned.  FHT: 125 bpm, mod var, +accels, no decels TOCO: q7-9 min  IUPC placed  A/P: IUPC placed due to not able to pick up contractions Pitocin to be started AROM performed, clear fluid returned Continue expectant management Anticipate SVD

## 2016-09-05 NOTE — Anesthesia Postprocedure Evaluation (Signed)
Anesthesia Post Note  Patient: [name]  Procedure(s) Performed: * No procedures listed *  Patient location during evaluation: Mother Baby Anesthesia Type: Epidural Level of consciousness: awake, awake and alert, oriented and patient cooperative Pain management: pain level controlled Vital Signs Assessment: post-procedure vital signs reviewed and stable Respiratory status: spontaneous breathing, nonlabored ventilation and respiratory function stable Cardiovascular status: stable Postop Assessment: no headache, no backache, patient able to bend at knees and no signs of nausea or vomiting Anesthetic complications: no        Last Vitals:  Vitals:   09/05/16 0533 09/05/16 0655  BP: 130/69   Pulse: 98   Resp: 18   Temp: 37.9 C 36.8 C    Last Pain:  Vitals:   09/05/16 0655  TempSrc: Oral  PainSc:    Pain Goal: Patients Stated Pain Goal: 0 (09/04/16 1924)               Trai Ells L

## 2016-09-05 NOTE — Lactation Note (Signed)
This note was copied from a baby's chart. Lactation Consultation Note  Patient Name: [name] WUJWJ'XToday's Date: 09/05/2016 Reason for consult: Initial assessment Mom reports baby has latched well few times today. Mom reports she BF 1st baby for 4-5 months but stopped due to yeast infections. Advised Mom not to use Lanolin if she develops sore nipples. Discussed considering probiotics and limiting refined sugars in her diet. Advised Mom to continue to BF with feeding ques, basic teaching reviewed. Lactation brochure left for review, advised of OP services and support group. Encouraged to call for latch check.   Maternal Data Has patient been taught Hand Expression?: No (Mom reports she knows how to hand express) Does the patient have breastfeeding experience prior to this delivery?: Yes  Feeding Feeding Type: Breast Fed Length of feed: 10 min  LATCH Score/Interventions                      Lactation Tools Discussed/Used WIC Program: No   Consult Status Consult Status: Follow-up Date: 09/06/16 Follow-up type: In-patient    Alfred LevinsGranger, [name] 09/05/2016, 3:54 PM

## 2016-09-06 ENCOUNTER — Other Ambulatory Visit: Payer: Medicaid Other

## 2016-09-06 LAB — CBC
HEMATOCRIT: 26.7 % — AB (ref 36.0–46.0)
Hemoglobin: 8.8 g/dL — ABNORMAL LOW (ref 12.0–15.0)
MCH: 31.2 pg (ref 26.0–34.0)
MCHC: 33 g/dL (ref 30.0–36.0)
MCV: 94.7 fL (ref 78.0–100.0)
PLATELETS: 223 10*3/uL (ref 150–400)
RBC: 2.82 MIL/uL — AB (ref 3.87–5.11)
RDW: 13.8 % (ref 11.5–15.5)
WBC: 12 10*3/uL — AB (ref 4.0–10.5)

## 2016-09-06 MED ORDER — IBUPROFEN 600 MG PO TABS: 600.0000 mg | ORAL_TABLET | Freq: Four times a day (QID) | ORAL | 0 refills | Status: DC

## 2016-09-06 NOTE — Discharge Instructions (Signed)
Intrauterine Device Information An intrauterine device (IUD) is inserted into your uterus to prevent pregnancy. There are two types of IUDs available:  Copper IUD--This type of IUD is wrapped in copper wire and is placed inside the uterus. Copper makes the uterus and fallopian tubes produce a fluid that kills sperm. The copper IUD can stay in place for 10 years.  Hormone IUD--This type of IUD contains the hormone progestin (synthetic progesterone). The hormone thickens the cervical mucus and prevents sperm from entering the uterus. It also thins the uterine lining to prevent implantation of a fertilized egg. The hormone can weaken or kill the sperm that get into the uterus. One type of hormone IUD can stay in place for 5 years, and another type can stay in place for 3 years. Your health care provider will make sure you are a good candidate for a contraceptive IUD. Discuss with your health care provider the possible side effects. Advantages of an intrauterine device  IUDs are highly effective, reversible, long acting, and low maintenance.  There are no estrogen-related side effects.  An IUD can be used when breastfeeding.  IUDs are not associated with weight gain.  The copper IUD works immediately after insertion.  The hormone IUD works right away if inserted within 7 days of your period starting. You will need to use a backup method of birth control for 7 days if the hormone IUD is inserted at any other time in your cycle.  The copper IUD does not interfere with your female hormones.  The hormone IUD can make heavy menstrual periods lighter and decrease cramping.  The hormone IUD can be used for 3 or 5 years.  The copper IUD can be used for 10 years. Disadvantages of an intrauterine device  The hormone IUD can be associated with irregular bleeding patterns.  The copper IUD can make your menstrual flow heavier and more painful.  You may experience cramping and vaginal bleeding after  insertion. This information is not intended to replace advice given to you by your health care provider. Make sure you discuss any questions you have with your health care provider. Document Released: 05/18/2004 Document Revised: 11/20/2015 Document Reviewed: 12/03/2012 Elsevier Interactive Patient Education  2017 [employer] Home Care Instructions for Mom  ACTIVITY  Gradually return to your regular activities.  Let yourself rest. Nap while your baby sleeps.  Avoid lifting anything that is heavier than 10 lb (4.5 kg) until your health care provider says it is okay.  Avoid activities that take a lot of effort and energy (are strenuous) until approved by your health care provider. Walking at a slow-to-moderate pace is usually safe.  If you had a cesarean delivery:  Do not vacuum, climb stairs, or drive a car for 4-6 weeks.  Have someone help you at home until you feel like you can do your usual activities yourself.  Do exercises as told by your health care provider, if this applies. VAGINAL BLEEDING You may continue to bleed for 4-6 weeks after delivery. Over time, the amount of blood usually decreases and the color of the blood usually gets lighter. However, the flow of bright red blood may increase if you have been too active. If you need to use more than one pad in an hour because your pad gets soaked, or if you pass a large clot:  Lie down.  Raise your feet.  Place a cold compress on your lower abdomen.  Rest.  Call your health care provider. If you  are breastfeeding, your period should return anytime between 8 weeks after delivery and the time that you stop breastfeeding. If you are not breastfeeding, your period should return 6-8 weeks after delivery. PERINEAL CARE The perineal area, or perineum, is the part of your body between your thighs. After delivery, this area needs special care. Follow these instructions as told by your health care provider.  Take warm tub baths  for 15-20 minutes.  Use medicated pads and pain-relieving sprays and creams as told.  Do not use tampons or douches until vaginal bleeding has stopped.  Each time you go to the bathroom:  Use a peri bottle.  Change your pad.  Use towelettes in place of toilet paper until your stitches have healed.  Do Kegel exercises every day. Kegel exercises help to maintain the muscles that support the vagina, bladder, and bowels. You can do these exercises while you are standing, sitting, or lying down. To do Kegel exercises:  Tighten the muscles of your abdomen and the muscles that surround your birth canal.  Hold for a few seconds.  Relax.  Repeat until you have done this 5 times in a row.  To prevent hemorrhoids from developing or getting worse:  Drink enough fluid to keep your urine clear or pale yellow.  Avoid straining when having a bowel movement.  Take over-the-counter medicines and stool softeners as told by your health care provider. BREAST CARE  Wear a tight-fitting bra.  Avoid taking over-the-counter pain medicine for breast discomfort.  Apply ice to the breasts to help with discomfort as needed:  Put ice in a plastic bag.  Place a towel between your skin and the bag.  Leave the ice on for 20 minutes or as told by your health care provider. NUTRITION  Eat a well-balanced diet.  Do not try to lose weight quickly by cutting back on calories.  Take your prenatal vitamins until your postpartum checkup or until your health care provider tells you to stop. POSTPARTUM DEPRESSION You may find yourself crying for no apparent reason and unable to cope with all of the changes that come with having a newborn. This mood is called postpartum depression. Postpartum depression happens because your hormone levels change after delivery. If you have postpartum depression, get support from your partner, friends, and family. If the depression does not go away on its own after several  weeks, contact your health care provider. BREAST SELF-EXAM Do a breast self-exam each month, at the same time of the month. If you are breastfeeding, check your breasts just after a feeding, when your breasts are less full. If you are breastfeeding and your period has started, check your breasts on day 5, 6, or 7 of your period. Report any lumps, bumps, or discharge to your health care provider. Know that breasts are normally lumpy if you are breastfeeding. This is temporary, and it is not a health risk. INTIMACY AND SEXUALITY Avoid sexual activity for at least 3-4 weeks after delivery or until the brownish-red vaginal flow is completely gone. If you want to avoid pregnancy, use some form of birth control. You can get pregnant after delivery, even if you have not had your period. SEEK MEDICAL CARE IF:  You feel unable to cope with the changes that a child brings to your life, and these feelings do not go away after several weeks.  You notice a lump, a bump, or discharge on your breast. SEEK IMMEDIATE MEDICAL CARE IF:  Blood soaks your pad in  1 hour or less.  You have:  Severe pain or cramping in your lower abdomen.  A bad-smelling vaginal discharge.  A fever that is not controlled by medicine.  A fever, and an area of your breast is red and sore.  Pain or redness in your calf.  Sudden, severe chest pain.  Shortness of breath.  Painful or bloody urination.  Problems with your vision.  You vomit for 12 hours or longer.  You develop a severe headache.  You have serious thoughts about hurting yourself, your child, or anyone else. This information is not intended to replace advice given to you by your health care provider. Make sure you discuss any questions you have with your health care provider. Document Released: 06/11/2000 Document Revised: 11/20/2015 Document Reviewed: 12/16/2014 Elsevier Interactive Patient Education  2017 [employer].

## 2016-09-06 NOTE — Lactation Note (Signed)
This note was copied from a baby's chart. Lactation Consultation Note  Patient Name: [name] WUJWJ'XToday's Date: 09/06/2016 Reason for consult: Follow-up assessment   LC was called by mom for feeding assessment and hung up before LC could answer phone. LC was not able to get there for feeding and mom had already fed infant per FOB when LC went into room. Mom was in the bathroom currently. Asked FOB to have mom call back out with next feeding.   Maternal Data Formula Feeding for Exclusion: No Has patient been taught Hand Expression?: Yes Does the patient have breastfeeding experience prior to this delivery?: Yes  Feeding    LATCH Score/Interventions                      Lactation Tools Discussed/Used WIC Program: No   Consult Status Consult Status: Follow-up Date: 09/06/16 Follow-up type: In-patient    [name] [name] 09/06/2016, 10:05 AM

## 2016-09-06 NOTE — Discharge Summary (Signed)
Obstetric Discharge Summary Reason for Admission: 40 weeks SOL Prenatal Procedures: ultrasound Intrapartum Procedures: spontaneous vaginal delivery Postpartum Procedures: none Complications-Operative and Postpartum: none Hemoglobin  Date Value Ref Range Status  09/06/2016 8.8 (L) 12.0 - 15.0 g/dL Final   HCT  Date Value Ref Range Status  09/06/2016 26.7 (L) 36.0 - 46.0 % Final    Physical Exam:  General: alert and cooperative Lochia: appropriate Uterine Fundus: firm and NT at U-2 DVT Evaluation: No evidence of DVT seen on physical exam.  Discharge Diagnoses: Term Pregnancy-delivered  Discharge Information: Date: 09/06/2016 Activity: pelvic rest Diet: routine Medications: PNV and Ibuprofen Condition: stable Instructions: refer to practice specific booklet Discharge to: home Follow-up Information    [name] Artice Holohan, MD. Schedule an appointment as soon as possible for a visit in 6 week(s).   Specialty:  Obstetrics and Gynecology Why:  considering IUD Contact information: [address] [place] [id number] [phone]           Newborn Data: Live born female  Birth Weight: 6 lb 11.8 oz (3055 g) APGAR: 9, 9  Home with mother pending peds approval.  [name] [name] 09/06/2016, 6:30 AM

## 2016-09-06 NOTE — Lactation Note (Signed)
This note was copied from a baby's chart. Lactation Consultation Note  Patient Name: [name] JXBJY'NToday's Date: 09/06/2016 Reason for consult: Follow-up assessment   Follow up with mom of 30 hour old infant. Infant asleep on mom's chest. Mom reports BF is going ok. She reports she is having difficulty getting infant to latch to left breast, offered assistance and mom reports she would like that. Left LC phone # on her board for her to call when infant wakes up to feed for LATCH assessment and assisting mom with left breast. Mom voiced understanding.  Infant with 6 BF for 10-35 minutes, 1 attempt, 6 voids and 5 stool in the 24 hours preceding this assessment. LATCH scores 7-8. Infant weight 6 lb 5.1 oz with 6% weight loss since birth, also within the first 24 hours. Mom denies feeling fuller and denies breast pain/tenderness.   Reviewed engorgement prevention/treatment, I/O and Breast milk handling and storage. Mom reports she has an [employer] Single pump for home use. Mom is planning to call this morning to make follow up Ped appt for Tuesday or Wednesday. Mom without any further questions/concerns at this time.    Maternal Data Formula Feeding for Exclusion: No Has patient been taught Hand Expression?: Yes Does the patient have breastfeeding experience prior to this delivery?: Yes  Feeding Feeding Type: Breast Fed Length of feed: 30 min  LATCH Score/Interventions                      Lactation Tools Discussed/Used WIC Program: No   Consult Status Consult Status: Follow-up Date: 09/06/16 Follow-up type: In-patient    [name] 09/06/2016, 8:18 AM

## 2016-09-07 MED ORDER — LIDOCAINE HCL (PF) 1 % IJ SOLN
INTRAMUSCULAR | Status: DC | PRN
  Administered 2016-09-04 (×2): 6 mL via EPIDURAL

## 2016-09-07 NOTE — Anesthesia Procedure Notes (Signed)
Epidural Patient location during procedure: OB Start time: 09/04/2016 8:33 PM End time: 09/04/2016 8:37 PM  Staffing Anesthesiologist: Leilani AbleHATCHETT, Charika Mikelson Performed: anesthesiologist   Preanesthetic Checklist Completed: patient identified, surgical consent, pre-op evaluation, timeout performed, IV checked, risks and benefits discussed and monitors and equipment checked  Epidural Patient position: sitting Prep: site prepped and draped and DuraPrep Patient monitoring: continuous pulse ox and blood pressure Approach: midline Location: L3-L4 Injection technique: LOR air  Needle:  Needle type: Tuohy  Needle gauge: 17 G Needle length: 9 cm and 9 Needle insertion depth: 6 cm Catheter type: closed end flexible Catheter size: 19 Gauge Catheter at skin depth: 11 cm Test dose: negative and Other  Assessment Sensory level: T9 Events: blood not aspirated, injection not painful, no injection resistance, negative IV test and no paresthesia  Additional Notes Reason for block:procedure for pain

## 2016-09-08 LAB — TYPE AND SCREEN
ABO/RH(D): A POS
ANTIBODY SCREEN: POSITIVE
DAT, IgG: NEGATIVE
UNIT DIVISION: 0
Unit division: 0

## 2016-09-08 LAB — BPAM RBC
BLOOD PRODUCT EXPIRATION DATE: 201804042359
Blood Product Expiration Date: 201804042359
UNIT TYPE AND RH: 5100
Unit Type and Rh: 5100

## 2016-09-09 ENCOUNTER — Inpatient Hospital Stay (HOSPITAL_COMMUNITY): Admission: RE | Admit: 2016-09-09 | Payer: Medicaid Other | Source: Ambulatory Visit

## 2016-11-26 IMAGING — US US MFM OB TRANSVAGINAL
1 series · 13 of 13 positions shown · non-contrast
Comparison: none

[Series 1: us mfm ob transvaginal · 13 of 13 slices shown]
[im 1/13]
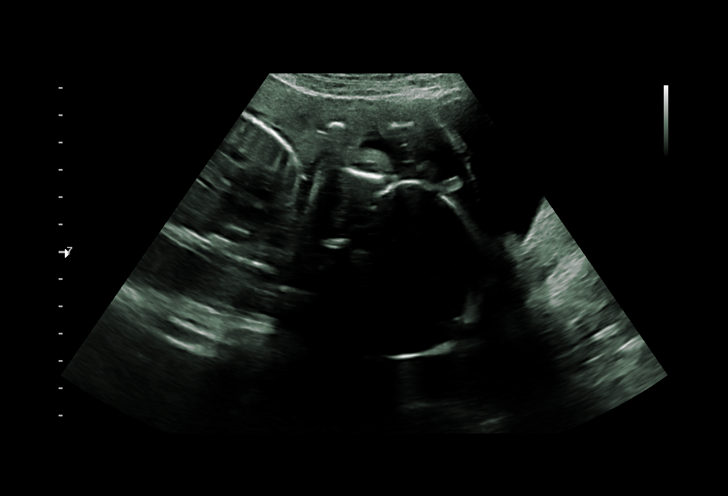
[im 2/13]
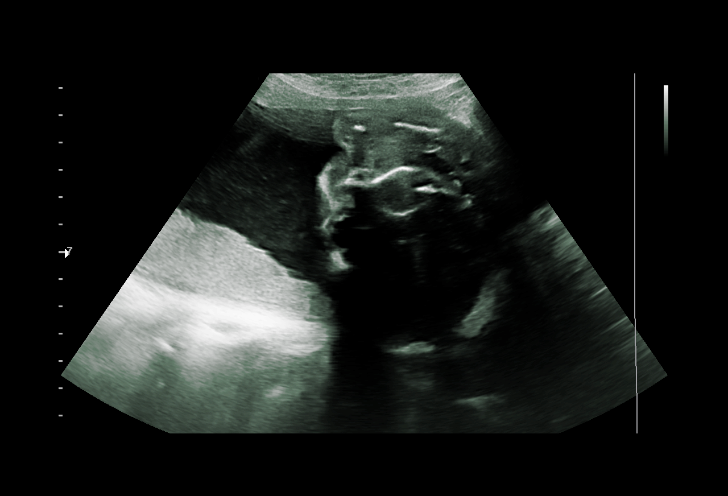
[im 3/13]
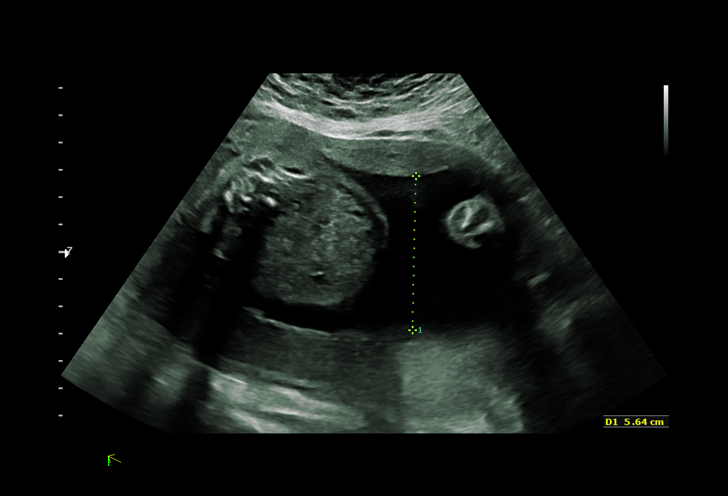
[im 4/13]
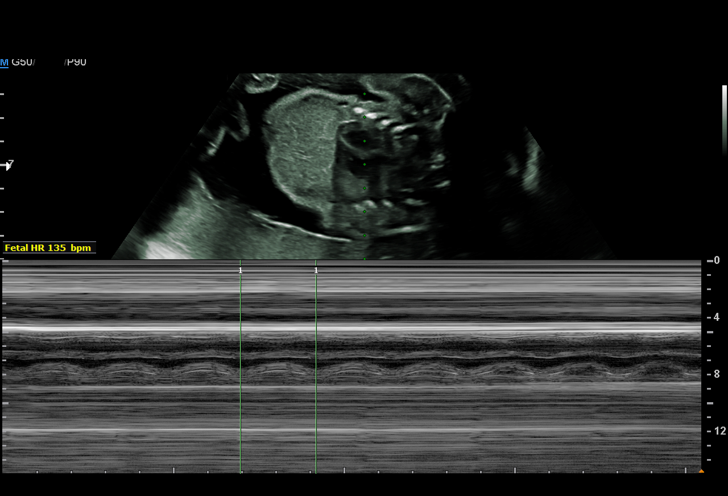
[im 5/13]
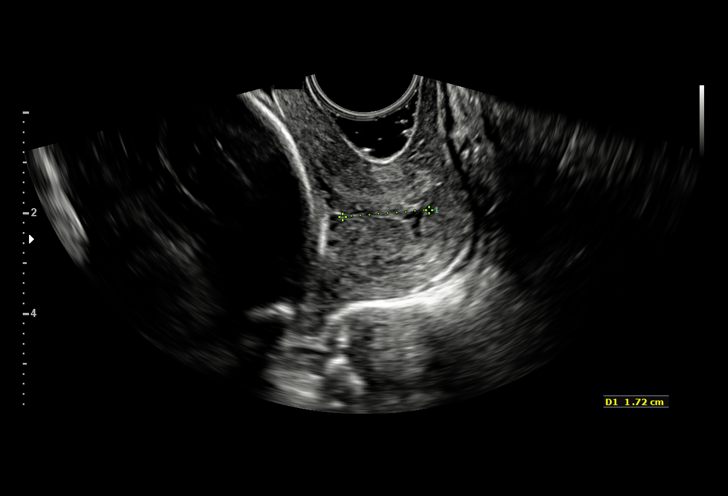
[im 6/13]
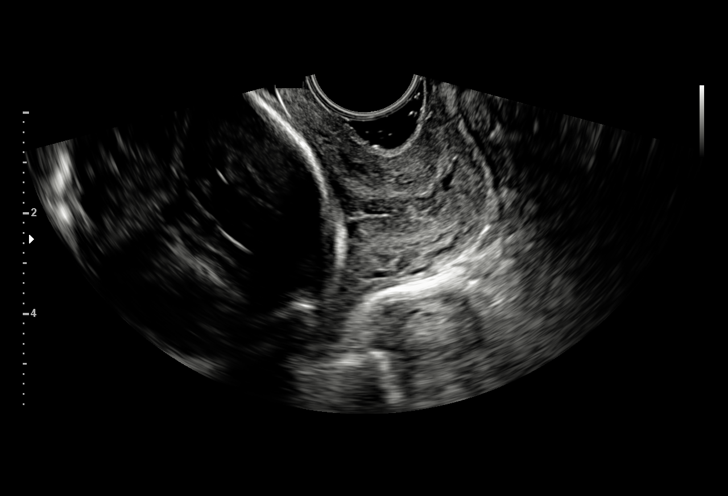
[im 7/13]
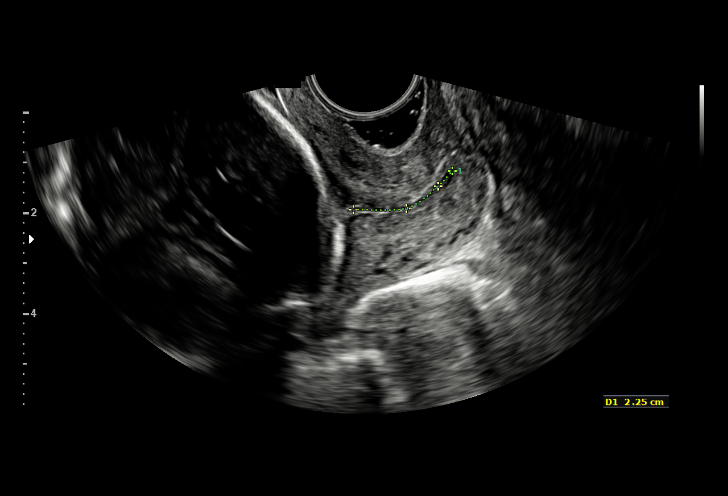
[im 8/13]
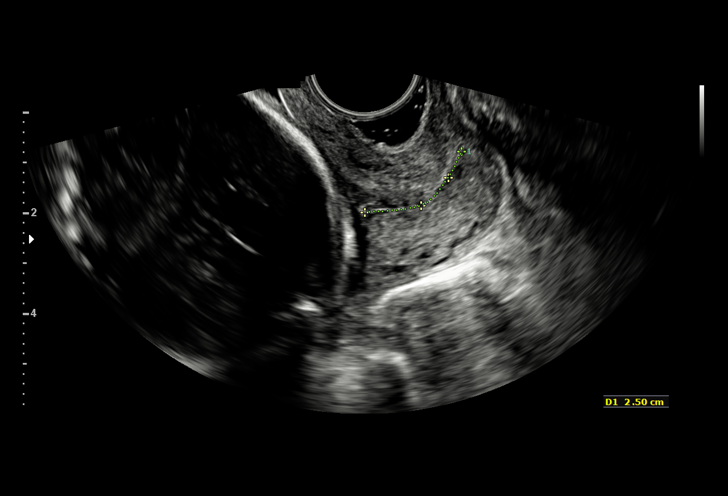
[im 9/13]
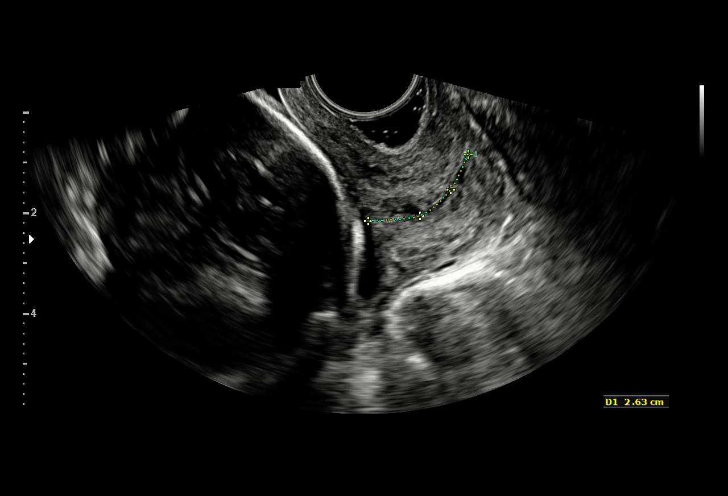
[im 10/13]
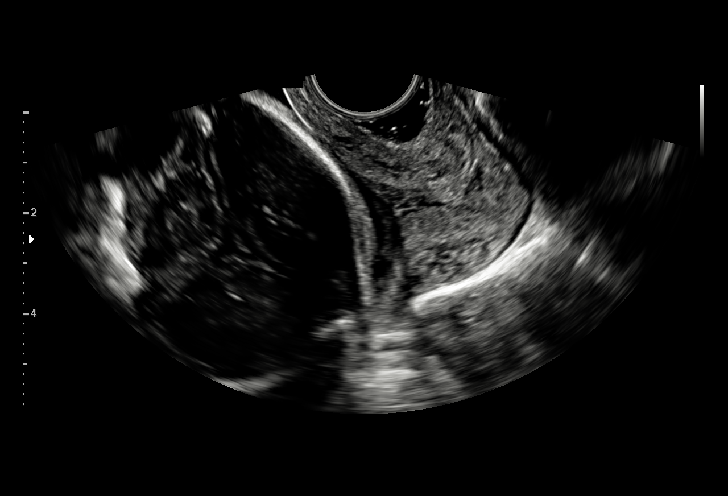
[im 11/13]
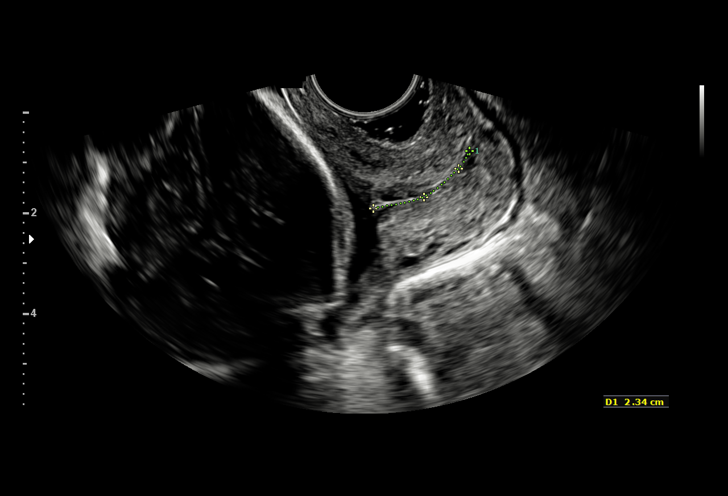
[im 12/13]
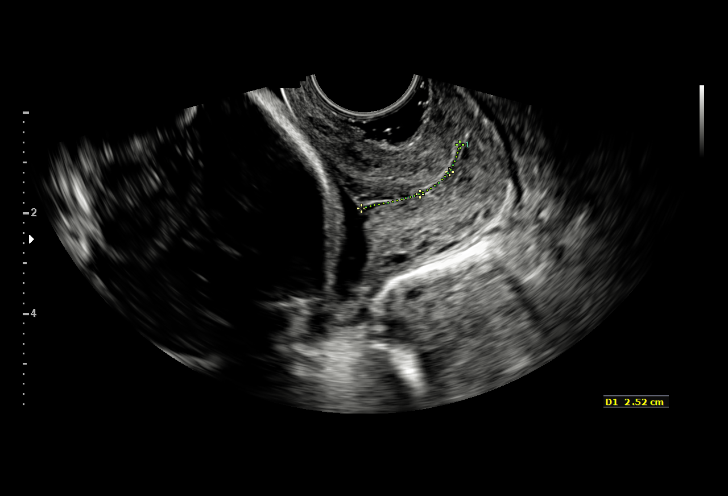
[im 13/13]
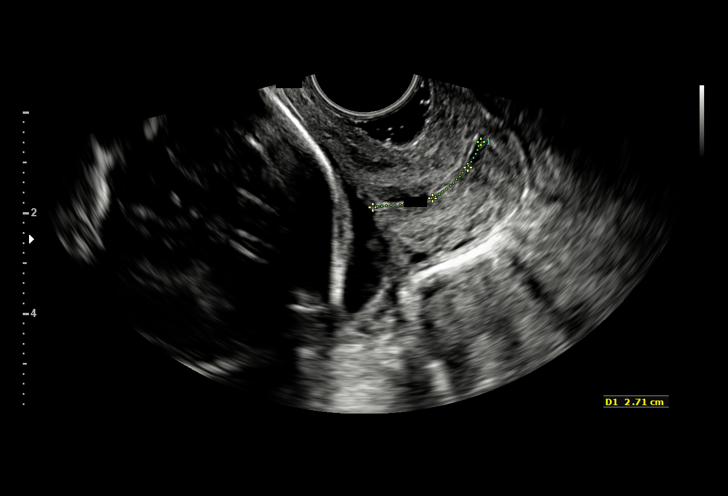

[13 of 13 positions shown; findings below may reference images not displayed]

[REDACTED]

Indications

24 weeks gestation of pregnancy
Cervical shortening complicating pregnancy
Fetal abnormality - other known; bilateral
UTD A2-3
OB History

Gravidity:    3         Term:   1        Prem:   0        SAB:   1
TOP:          0       Ectopic:  0        Living: 1
Fetal Evaluation

Num Of Fetuses:     1
Fetal Heart         135
Rate(bpm):
Cardiac Activity:   Observed
Presentation:       Cephalic
Placenta:           Posterior, above cervical os

Amniotic Fluid
AFI FV:      Subjectively within normal limits

Largest Pocket(cm)
5.6
Gestational Age

Best:          24w 5d    Det. By:   U/S  (04/19/16)          EDD:   09/03/16
Cervix Uterus Adnexa

Cervix
Length:            2.5  cm.
Measured transvaginally.
Impression

SIUP at 24+5 weeks
Normal amniotic fluid volume
EV views of cervix: slightly shortened cervical length (2.5
cms); no funneling
Recommendations

Continue vaginal progesterone
CL previously scheduled for [DATE]
F/U US for kidneys on [DATE]

## 2016-12-02 IMAGING — US US MFM OB TRANSVAGINAL
1 series · 12 of 28 positions shown · non-contrast
Comparison: none

[Series 1: us mfm ob transvaginal · 30 acquisitions, 12 frames shown]
[im 2/30]
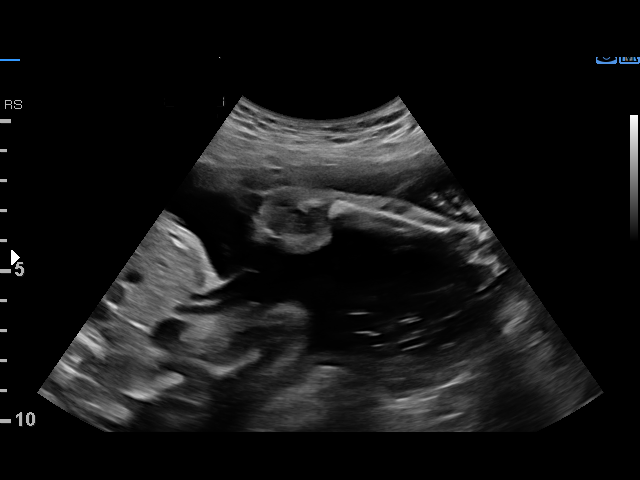
[im 4/30]
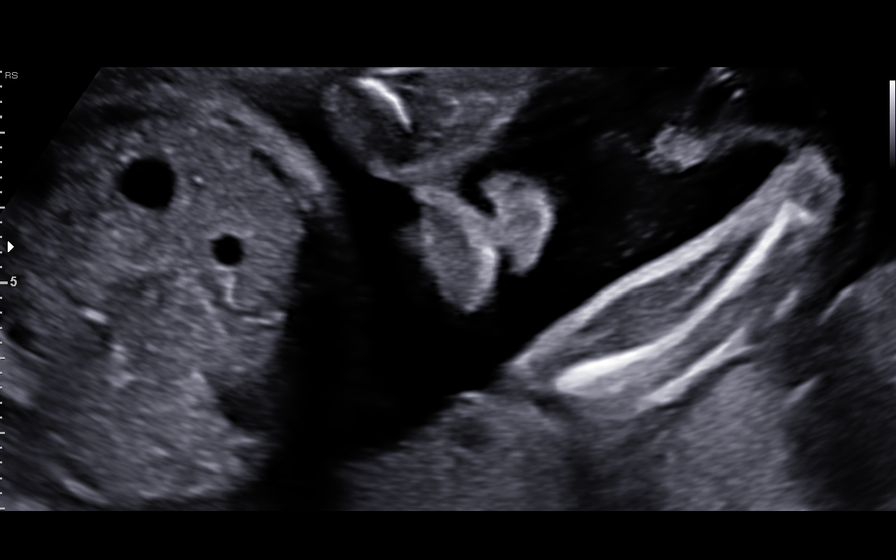
[im 6/30]
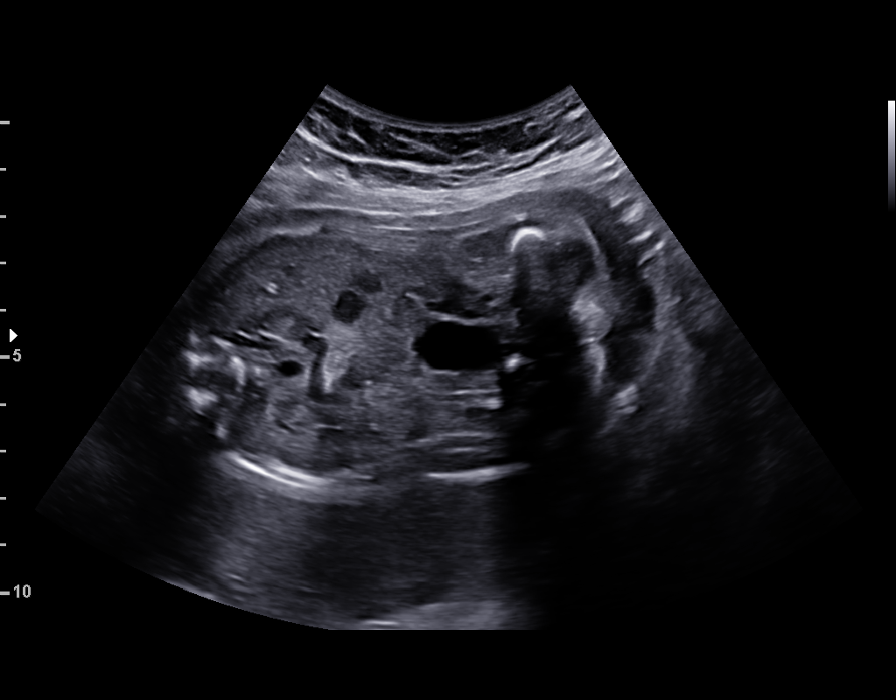
[im 9/30]
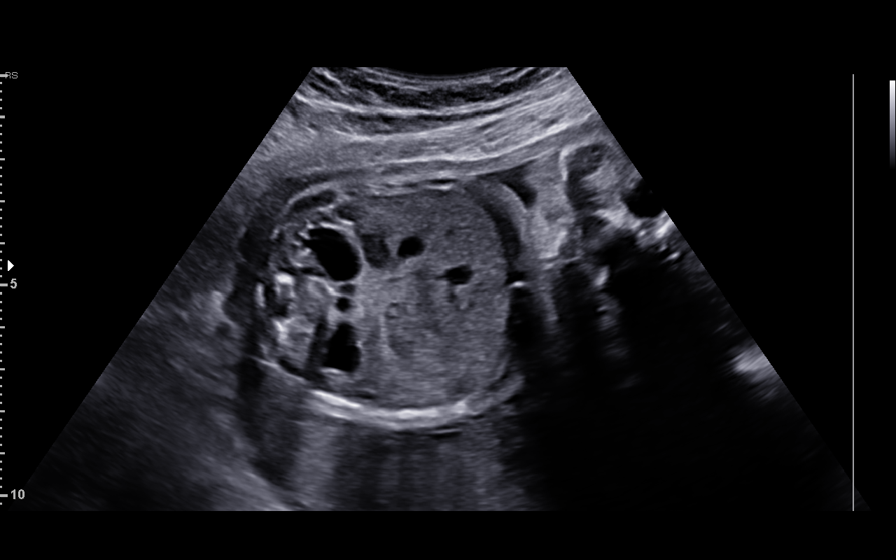
[im 11/30]
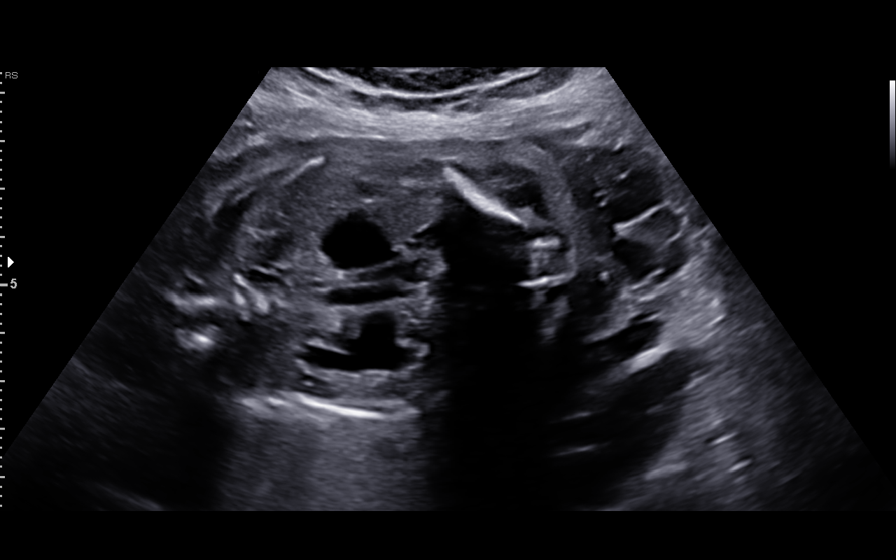
[im 13/30]
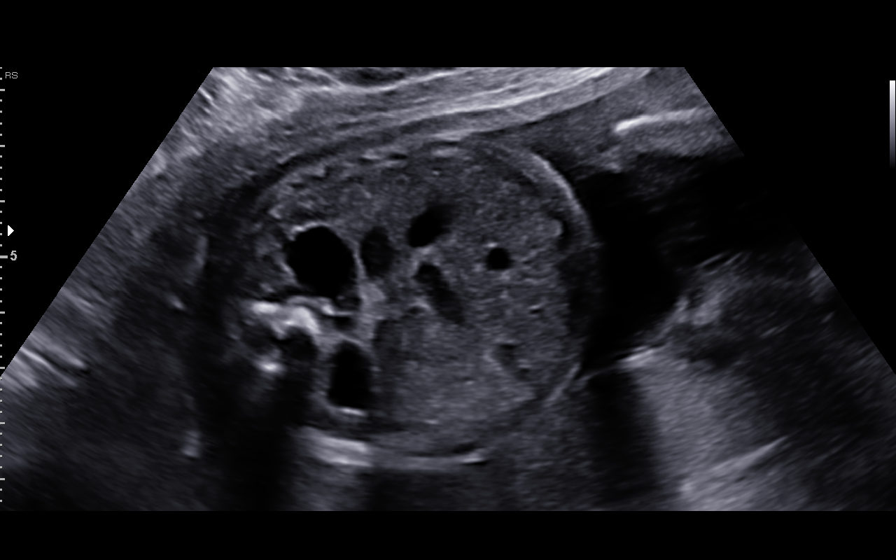
[im 17/30]
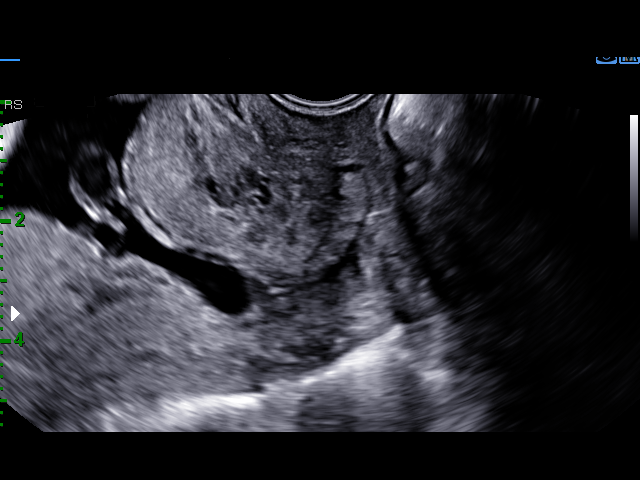
[im 19/30]
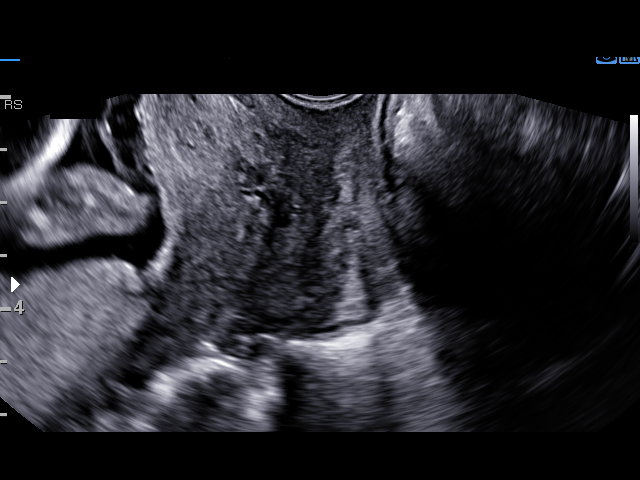
[im 21/30]
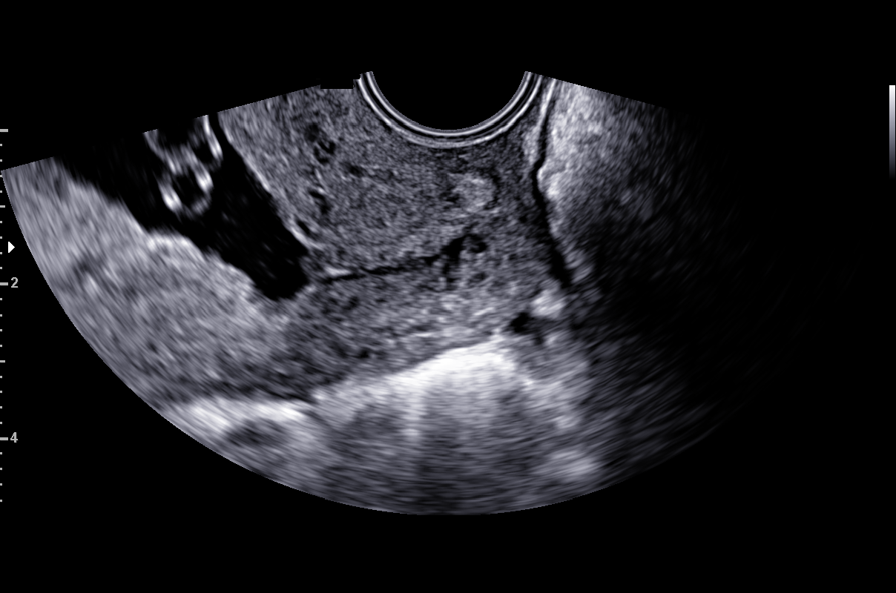
[im 24/30]
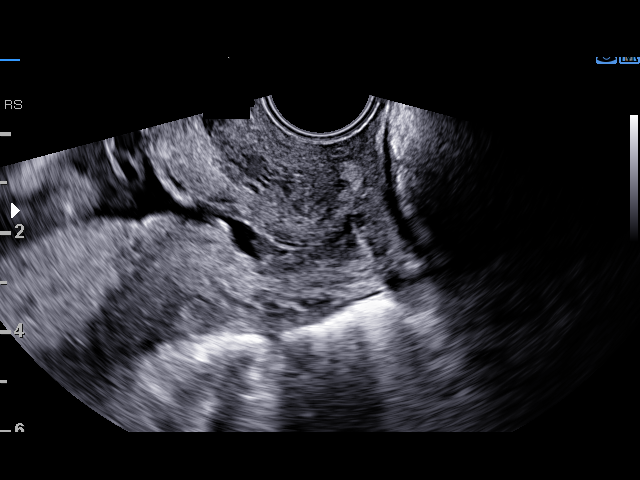
[im 26/30]
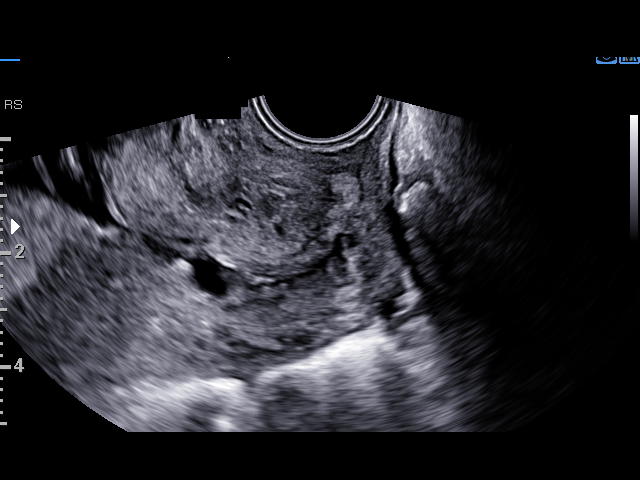
[im 28/30]
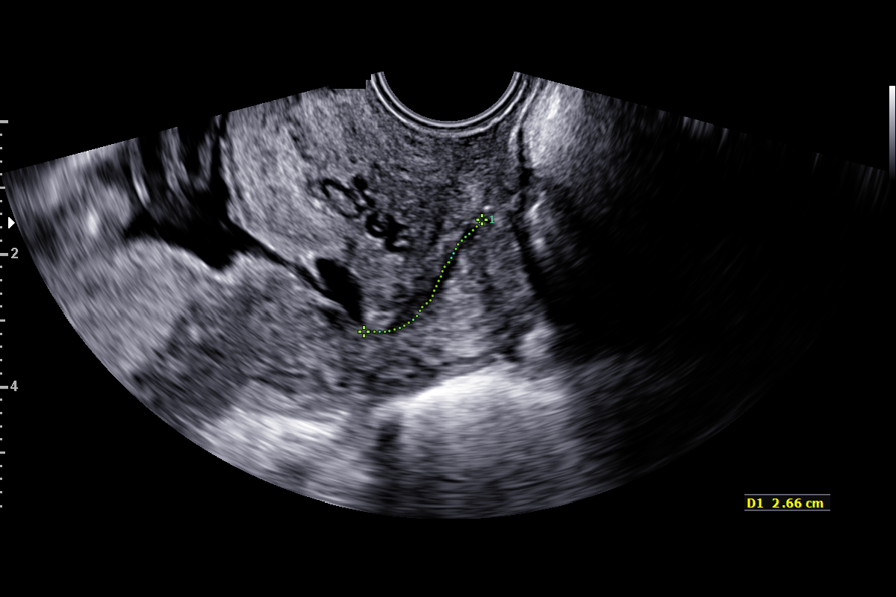

[12 of 28 positions shown; findings below may reference images not displayed]

[REDACTED]

1  KAYY TURGOOSE            [id number]      [id number]     [id number]
Indications

25 weeks gestation of pregnancy
Fetal abnormality - other known; bilateral
UTD A2-3
Pyelectasis of fetus on prenatal ultrasound
OB History

Gravidity:    3         Term:   1        Prem:   0        SAB:   1
TOP:          0       Ectopic:  0        Living: 1
Fetal Evaluation

Num Of Fetuses:     1
Fetal Heart         142
Rate(bpm):
Cardiac Activity:   Observed
Presentation:       Transverse, head to maternal left

Amniotic Fluid
AFI FV:      Subjectively within normal limits

Largest Pocket(cm)
4.12
Gestational Age
Best:          25w 4d    Det. By:   U/S  (04/19/16)          EDD:   09/03/16
Anatomy

Stomach:               Appears normal, left   Bladder:                Appears normal
sided
Cervix Uterus Adnexa

Cervix
Length:            2.3  cm.
Measured transvaginally.
Impression

IUP at 25+4 weeks with short dynamic cervical anatomy and
fetal pyelectasis
Normal fetal movement and cardiac activity is noted
The previously noted pyelectasis is still present
Transvaginal cervical evaluation shows a dynamic cervix,
measuring 18-23 mm
Recommendations

The cervical exam is essential unchanged. Continue current
treatment regimen. F/U scan in 2 weeks for growth and
cervical anatomy

## 2016-12-07 ENCOUNTER — Ambulatory Visit: Payer: Medicaid Other | Admitting: Obstetrics and Gynecology

## 2017-01-03 ENCOUNTER — Ambulatory Visit: Payer: Medicaid Other | Admitting: Family Medicine

## 2017-01-14 ENCOUNTER — Encounter: Payer: Self-pay | Admitting: Obstetrics and Gynecology

## 2017-01-14 ENCOUNTER — Ambulatory Visit: Payer: Medicaid Other | Admitting: Obstetrics and Gynecology

## 2017-01-14 NOTE — Progress Notes (Signed)
Patient did not keep postpartum appointment for 01/14/2017.  [name], Jr MD Attending Center for [facility](Faculty Practice)

## 2017-03-01 ENCOUNTER — Encounter: Payer: Self-pay | Admitting: *Deleted

## 2017-03-01 ENCOUNTER — Ambulatory Visit: Payer: Medicaid Other | Admitting: Obstetrics and Gynecology

## 2017-03-16 ENCOUNTER — Ambulatory Visit: Payer: Medicaid Other | Admitting: Obstetrics and Gynecology

## 2017-04-08 ENCOUNTER — Ambulatory Visit: Payer: Medicaid Other | Admitting: Obstetrics and Gynecology

## 2017-04-08 NOTE — Progress Notes (Deleted)
Last pap 02/2016 - Normal No PCP listed

## 2017-05-10 ENCOUNTER — Encounter (INDEPENDENT_AMBULATORY_CARE_PROVIDER_SITE_OTHER): Payer: Self-pay | Admitting: *Deleted

## 2017-05-10 DIAGNOSIS — Z029 Encounter for administrative examinations, unspecified: Secondary | ICD-10-CM

## 2017-08-16 ENCOUNTER — Encounter: Payer: Self-pay | Admitting: Obstetrics and Gynecology

## 2017-08-16 ENCOUNTER — Ambulatory Visit (INDEPENDENT_AMBULATORY_CARE_PROVIDER_SITE_OTHER): Payer: Medicaid Other | Admitting: Obstetrics and Gynecology

## 2017-08-16 ENCOUNTER — Other Ambulatory Visit (HOSPITAL_COMMUNITY)
Admission: RE | Admit: 2017-08-16 | Discharge: 2017-08-16 | Disposition: A | Discharge status: Home / Self Care | Payer: Medicaid Other | Source: Ambulatory Visit | Attending: Obstetrics and Gynecology | Admitting: Obstetrics and Gynecology

## 2017-08-16 VITALS — BP 119/79 | HR 72 | Wt 201.1 lb

## 2017-08-16 DIAGNOSIS — Z01419 Encounter for gynecological examination (general) (routine) without abnormal findings: Secondary | ICD-10-CM

## 2017-08-16 DIAGNOSIS — Z309 Encounter for contraceptive management, unspecified: Secondary | ICD-10-CM | POA: Diagnosis not present

## 2017-08-16 DIAGNOSIS — M545 Low back pain, unspecified: Secondary | ICD-10-CM

## 2017-08-16 NOTE — Progress Notes (Signed)
Obstetrics and Gynecology Annual Patient Evaluation  Appointment Date: 08/16/2017  [facility]  Chief Complaint:  Annual and low back and cramping  History of Present Illness: [name] is a 27 y.o. African-American [phone] (Patient's last menstrual period was 07/25/2017.), seen for the above chief complaint. Her past medical history is significant for BMI 30s   LMP 1/28 x 4 days which was her first one since she had her child in march 2018 b/c she's breastfeeding.  Last intercourse in January 2019.   Cramping x 2wks mid back and lower left, better with moving to different position and mid low belly. When she found out she was pregnant. Doesn't happen every day   No breast s/s, fevers, chills, chest pain, SOB, nausea, vomiting, abdominal pain, dysuria, hematuria, vaginal itching, dyspareunia, diarrhea, constipation, blood in BMs  Review of Systems:  as noted in the History of Present Illness.   Past Medical History:  Past Medical History:  Diagnosis Date  . BV (bacterial vaginosis)   . Trichomonas     Past Surgical History:  Past Surgical History:  Procedure Laterality Date  . DILATION AND CURETTAGE OF UTERUS      Past Obstetrical History:  OB History  Gravida Para Term Preterm AB Living  4 2 2  0 2 2  SAB TAB Ectopic Multiple Live Births  1 0 0 0 2    # Outcome Date GA Lbr Len/2nd Weight Sex Delivery Anes PTL Lv  4 Term 09/05/16 8738w3d 10:07 / 00:24 6 lb 11.8 oz (3.055 kg) M Vag-Spont EPI  LIV  3 Term 12/06/13 7110w2d 10:43 / 00:44 7 lb 0.2 oz (3.181 kg) M Vag-Spont Local, EPI  LIV  2 AB 06/28/09 8270w0d         1 SAB               Past Gynecological History: As per HPI. Periods: as per HPI.  History of Pap Smear(s): Yes.   Last pap 2017, which was NILM She is currently using nothing for contraception.   Social History:  Social History   Socioeconomic History  . Marital status: Single    Spouse name: Not on file   . Number of children: Not on file  . Years of education: Not on file  . Highest education level: Not on file  Social Needs  . Financial resource strain: Not on file  . Food insecurity - worry: Not on file  . Food insecurity - inability: Not on file  . Transportation needs - medical: Not on file  . Transportation needs - non-medical: Not on file  Occupational History  . Not on file  Tobacco Use  . Smoking status: Former Smoker    Last attempt to quit: 04/02/2013    Years since quitting: 4.3  . Smokeless tobacco: Former Engineer, waterUser  Substance and Sexual Activity  . Alcohol use: No    Alcohol/week: 0.0 oz    Comment: occasionally   . Drug use: No  . Sexual activity: Yes    Partners: Male    Birth control/protection: None  Other Topics Concern  . Not on file  Social History Narrative  . Not on file    Family History:  Family History  Problem Relation Age of Onset  . Cancer Brother   . Hypertension Maternal Grandfather   . Diabetes Brother   . Heart disease Neg Hx     Medications Current Outpatient Medications  Medication Sig Dispense Refill  .  Prenat-FeAsp-Meth-FA-DHA w/o A (PRENATE PIXIE) 10-0.6-0.4-200 MG CAPS Take 1 capsule by mouth at bedtime.     No current facility-administered medications for this visit.     Allergies Latex and Sulfa antibiotics   Physical Exam:  BP 119/79   Pulse 72   Wt 201 lb 1.6 oz (91.2 kg)   LMP 07/25/2017   BMI 35.62 kg/m  Body mass index is 35.62 kg/m. General appearance: Well nourished, well developed female in no acute distress.  Neck:  Supple, normal appearance, and no thyromegaly  Cardiovascular: normal s1 and s2.  No murmurs, rubs or gallops. Respiratory:  Clear to auscultation bilateral. Normal respiratory effort Abdomen: positive bowel sounds and no masses, hernias; diffusely non tender to palpation, non distended Breasts: pt denies any breast s/s Neuro/Psych:  Normal mood and affect.  Skin:  Warm and dry.  Lymphatic:  No  inguinal lymphadenopathy.   Pelvic exam: is not limited by body habitus EGBUS: within normal limits, Vagina: within normal limits and with no blood or discharge in the vault, Cervix: normal appearing cervix without tenderness, discharge or lesions. Uterus:  nonenlarged and non tender and Adnexa:  normal adnexa and no mass, fullness, tenderness Rectovaginal: deferred  Laboratory: UPT negative. POC U/A +blood  Radiology: none  Assessment: pt stable  Plan:  1. Encounter for gynecological examination Will send for UCx. Blood contaminant could be from infection or b/c specimen collected after exam and pap. Pt declines BC - Cervicovaginal ancillary only - HIV antibody (with reflex) - Hepatitis B Surface AntiGEN - Hepatitis C Antibody - RPR   RTC PRN  Cornelia Copa MD Attending Center for [facility])

## 2017-08-16 NOTE — Progress Notes (Signed)
STI Testing

## 2017-08-17 LAB — CERVICOVAGINAL ANCILLARY ONLY
CHLAMYDIA, DNA PROBE: NEGATIVE
NEISSERIA GONORRHEA: NEGATIVE
TRICH (WINDOWPATH): NEGATIVE

## 2017-08-17 LAB — URINE CULTURE

## 2017-08-17 LAB — HIV ANTIBODY (ROUTINE TESTING W REFLEX): HIV Screen 4th Generation wRfx: NONREACTIVE

## 2017-08-17 LAB — RPR: RPR Ser Ql: NONREACTIVE

## 2017-08-17 LAB — HEPATITIS C ANTIBODY: HEP C VIRUS AB: 0.1 {s_co_ratio} (ref 0.0–0.9)

## 2017-08-17 LAB — HEPATITIS B SURFACE ANTIGEN: HEP B S AG: NEGATIVE

## 2018-05-04 ENCOUNTER — Ambulatory Visit: Payer: Medicaid Other

## 2019-02-19 ENCOUNTER — Other Ambulatory Visit (HOSPITAL_COMMUNITY)
Admission: RE | Admit: 2019-02-19 | Discharge: 2019-02-19 | Disposition: A | Discharge status: Home / Self Care | Payer: Medicaid Other | Source: Ambulatory Visit | Attending: Obstetrics and Gynecology | Admitting: Obstetrics and Gynecology

## 2019-02-19 ENCOUNTER — Encounter: Payer: Self-pay | Admitting: Obstetrics and Gynecology

## 2019-02-19 ENCOUNTER — Ambulatory Visit (INDEPENDENT_AMBULATORY_CARE_PROVIDER_SITE_OTHER): Payer: Medicaid Other | Admitting: Obstetrics and Gynecology

## 2019-02-19 ENCOUNTER — Other Ambulatory Visit: Payer: Self-pay

## 2019-02-19 VITALS — BP 120/81 | HR 103 | Ht 62.0 in | Wt 210.0 lb

## 2019-02-19 DIAGNOSIS — E669 Obesity, unspecified: Secondary | ICD-10-CM

## 2019-02-19 DIAGNOSIS — Z0000 Encounter for general adult medical examination without abnormal findings: Secondary | ICD-10-CM

## 2019-02-19 DIAGNOSIS — Z30011 Encounter for initial prescription of contraceptive pills: Secondary | ICD-10-CM

## 2019-02-19 DIAGNOSIS — N946 Dysmenorrhea, unspecified: Secondary | ICD-10-CM | POA: Insufficient documentation

## 2019-02-19 MED ORDER — LO LOESTRIN FE 1 MG-10 MCG / 10 MCG PO TABS: 1.0000 | ORAL_TABLET | Freq: Every day | ORAL | 3 refills | Status: DC

## 2019-02-19 MED ORDER — IBUPROFEN 600 MG PO TABS: ORAL_TABLET | ORAL | 0 refills | Status: DC

## 2019-02-19 NOTE — Progress Notes (Signed)
Obstetrics and Gynecology Annual Patient Evaluation  Appointment Date: 02/19/2019  OBGYN Clinic: Center for [facility]  Primary Care Provider: None  Chief Complaint: Annual exam, vag discharge  History of Present Illness: [name] is a 28 y.o. African-American [phone] (Patient's last menstrual period was 01/28/2019.), seen for the above chief complaint. Her past medical history is significant for h/o STD, BMI 30s   Only issue is cramping and discomfort with periods.   No breast s/s, fevers, chills, chest pain, SOB, nausea, vomiting, abdominal pain, dysuria, hematuria, vaginal itching, dyspareunia, diarrhea, constipation, blood in BMs  Review of Systems: as noted in the History of Present Illness.  Patient Active Problem List   Diagnosis Date Noted  . Dysmenorrhea 02/19/2019  . Obesity (BMI 30-39.9) 03/15/2016    Past Medical History:  Past Medical History:  Diagnosis Date  . BV (bacterial vaginosis)   . Trichomonas     Past Surgical History:  Past Surgical History:  Procedure Laterality Date  . DILATION AND CURETTAGE OF UTERUS      Past Obstetrical History:  OB History  Gravida Para Term Preterm AB Living  4 2 2  0 2 2  SAB TAB Ectopic Multiple Live Births  1 0 0 0 2    # Outcome Date GA Lbr Len/2nd Weight Sex Delivery Anes PTL Lv  4 Term 09/05/16 [redacted]w[redacted]d 10:07 / 00:24 6 lb 11.8 oz (3.055 kg) M Vag-Spont EPI  LIV  3 Term 12/06/13 [redacted]w[redacted]d 10:43 / 00:44 7 lb 0.2 oz (3.181 kg) M Vag-Spont Local, EPI  LIV  2 AB 06/28/09 [redacted]w[redacted]d         1 SAB             Past Gynecological History: As per HPI. Periods: qmonth, 3-7d, not heavy, +dysmenorrhea cramping in low belly and some in mid right belly History of Pap Smear(s): Yes.   Last pap 2017, which was neg History of STI(s): Yes.   She is currently using no method for contraception.   Social History:  Social History   Socioeconomic History  . Marital status: Single    Spouse name: Not on file  .  Number of children: Not on file  . Years of education: Not on file  . Highest education level: Not on file  Occupational History  . Not on file  Social Needs  . Financial resource strain: Not on file  . Food insecurity    Worry: Not on file    Inability: Not on file  . Transportation needs    Medical: Not on file    Non-medical: Not on file  Tobacco Use  . Smoking status: Former Smoker    Quit date: 04/02/2013    Years since quitting: 5.8  . Smokeless tobacco: Former Network engineer and Sexual Activity  . Alcohol use: No    Alcohol/week: 0.0 standard drinks    Comment: occasionally   . Drug use: No  . Sexual activity: Yes    Partners: Male    Birth control/protection: None  Lifestyle  . Physical activity    Days per week: Not on file    Minutes per session: Not on file  . Stress: Not on file  Relationships  . Social Herbalist on phone: Not on file    Gets together: Not on file    Attends religious service: Not on file    Active member of club or organization: Not on file    Attends meetings  of clubs or organizations: Not on file    Relationship status: Not on file  . Intimate partner violence    Fear of current or ex partner: Not on file    Emotionally abused: Not on file    Physically abused: Not on file    Forced sexual activity: Not on file  Other Topics Concern  . Not on file  Social History Narrative  . Not on file    Family History:  Family History  Problem Relation Age of Onset  . Cancer Brother   . Hypertension Maternal Grandfather   . Diabetes Brother   . Heart disease Neg Hx     Medications [name] had no medications administered during this visit. Current Outpatient Medications  Medication Sig Dispense Refill  . acetaminophen (TYLENOL) 500 MG tablet Take 1,000 mg by mouth every 6 (six) hours as needed for mild pain, moderate pain or headache.     . ibuprofen (ADVIL,MOTRIN) 600 MG tablet Take 1 tablet (600 mg total) by mouth  every 6 (six) hours. 30 tablet 0  . Prenat-FeAsp-Meth-FA-DHA w/o A (PRENATE PIXIE) 10-0.6-0.4-200 MG CAPS Take 1 capsule by mouth at bedtime.     No current facility-administered medications for this visit.     Allergies Latex and Sulfa antibiotics   Physical Exam:  BP 120/81   Pulse (!) 103   Ht 5\' 2"  (1.575 m)   Wt 210 lb (95.3 kg)   LMP 01/28/2019   BMI 38.41 kg/m  Body mass index is 38.41 kg/m. General appearance: Well nourished, well developed female in no acute distress.  Neck:  Supple, normal appearance, and no thyromegaly  Cardiovascular: normal s1 and s2.  No murmurs, rubs or gallops. Respiratory:  Clear to auscultation bilateral. Normal respiratory effort Abdomen: positive bowel sounds and no masses, hernias; diffusely non tender to palpation, non distended Breasts: breasts appear normal, no suspicious masses, no skin or nipple changes or axillary nodes, and negative palpation. Neuro/Psych:  Normal mood and affect.  Skin:  Warm and dry.  Lymphatic:  No inguinal lymphadenopathy.   Pelvic exam: is not limited by body habitus EGBUS: within normal limits, Vagina: within normal limits and with no blood or discharge in the vault, Cervix: normal appearing cervix without tenderness, discharge or lesions. Uterus:  nonenlarged and non tender and Adnexa:  normal adnexa and no mass, fullness, tenderness Rectovaginal: deferred  Laboratory: none  Radiology: none  Assessment: pt doing well  Plan:  1. Obesity (BMI 30-39.9) See below  2. Encounter for annual physical examination excluding gynecological examination in a patient older than 17 years Patient doesn't have PCP. Patient amenable to labs D/w her re: NSAIDs, heating pads for cramping and birth control. R/B/A d/w her (she's tried nexplanon and depo in the past and didn't like them) and she'd like to start OCPs. [name] sent in and pt to do Sunday start after next period (7 minutes) - Cytology - PAP( [place]) -  Cervicovaginal ancillary only( [place])  3. Dysmenorrhea See above  RTC 1 year.   Cornelia Copaharlie Susanne Baumgarner, Jr MD Attending Center for [facility](Faculty Practice)

## 2019-02-19 NOTE — Progress Notes (Signed)
Last pap 2017- normal  STI testing Flu?

## 2019-02-20 LAB — COMPREHENSIVE METABOLIC PANEL
ALT: 14 IU/L (ref 0–32)
AST: 14 IU/L (ref 0–40)
Albumin/Globulin Ratio: 1.4 (ref 1.2–2.2)
Albumin: 4.3 g/dL (ref 3.9–5.0)
Alkaline Phosphatase: 67 IU/L (ref 39–117)
BUN/Creatinine Ratio: 19 (ref 9–23)
BUN: 15 mg/dL (ref 6–20)
Bilirubin Total: 0.3 mg/dL (ref 0.0–1.2)
CO2: 20 mmol/L (ref 20–29)
Calcium: 9.5 mg/dL (ref 8.7–10.2)
Chloride: 104 mmol/L (ref 96–106)
Creatinine, Ser: 0.79 mg/dL (ref 0.57–1.00)
GFR calc Af Amer: 116 mL/min/{1.73_m2} (ref >59)
GFR calc non Af Amer: 101 mL/min/{1.73_m2} (ref >59)
Globulin, Total: 3.1 g/dL (ref 1.5–4.5)
Glucose: 104 mg/dL — ABNORMAL HIGH (ref 65–99)
Potassium: 4.1 mmol/L (ref 3.5–5.2)
Sodium: 137 mmol/L (ref 134–144)
Total Protein: 7.4 g/dL (ref 6.0–8.5)

## 2019-02-20 LAB — CBC
Hematocrit: 36.2 % (ref 34.0–46.6)
Hemoglobin: 12.2 g/dL (ref 11.1–15.9)
MCH: 30.5 pg (ref 26.6–33.0)
MCHC: 33.7 g/dL (ref 31.5–35.7)
MCV: 91 fL (ref 79–97)
Platelets: 326 10*3/uL (ref 150–450)
RBC: 4 x10E6/uL (ref 3.77–5.28)
RDW: 12.1 % (ref 11.7–15.4)
WBC: 10.7 10*3/uL (ref 3.4–10.8)

## 2019-02-20 LAB — HEMOGLOBIN A1C
Est. average glucose Bld gHb Est-mCnc: 103 mg/dL
Hgb A1c MFr Bld: 5.2 % (ref 4.8–5.6)

## 2019-02-20 LAB — HEPATITIS B SURFACE ANTIGEN: Hepatitis B Surface Ag: NEGATIVE

## 2019-02-20 LAB — HIV ANTIBODY (ROUTINE TESTING W REFLEX): HIV Screen 4th Generation wRfx: NONREACTIVE

## 2019-02-20 LAB — LIPID PANEL
Chol/HDL Ratio: 5.4 ratio — ABNORMAL HIGH (ref 0.0–4.4)
Cholesterol, Total: 220 mg/dL — ABNORMAL HIGH (ref 100–199)
HDL: 41 mg/dL (ref >39)
LDL Calculated: 146 mg/dL — ABNORMAL HIGH (ref 0–99)
Triglycerides: 166 mg/dL — ABNORMAL HIGH (ref 0–149)
VLDL Cholesterol Cal: 33 mg/dL (ref 5–40)

## 2019-02-20 LAB — TSH: TSH: 1.34 u[IU]/mL (ref 0.450–4.500)

## 2019-02-20 LAB — RPR: RPR Ser Ql: NONREACTIVE

## 2019-02-20 LAB — HEPATITIS C ANTIBODY: Hep C Virus Ab: <0.1 s/co ratio (ref 0.0–0.9)

## 2019-02-22 ENCOUNTER — Encounter: Payer: Self-pay | Admitting: Obstetrics and Gynecology

## 2019-02-22 DIAGNOSIS — E7800 Pure hypercholesterolemia, unspecified: Secondary | ICD-10-CM | POA: Insufficient documentation

## 2019-02-22 DIAGNOSIS — E781 Pure hyperglyceridemia: Secondary | ICD-10-CM | POA: Insufficient documentation

## 2019-02-22 LAB — CERVICOVAGINAL ANCILLARY ONLY: Candida vaginitis: NEGATIVE

## 2019-02-22 NOTE — Addendum Note (Signed)
Addended by: Aletha Halim on: 02/22/2019 11:11 AM   Modules accepted: Orders

## 2019-02-23 ENCOUNTER — Other Ambulatory Visit: Payer: Self-pay

## 2019-02-23 LAB — CYTOLOGY - PAP
Chlamydia: NEGATIVE
Diagnosis: NEGATIVE
HPV: NOT DETECTED
Neisseria Gonorrhea: NEGATIVE
Trichomonas: POSITIVE — AB

## 2019-02-23 MED ORDER — METRONIDAZOLE 500 MG PO TABS: 500.0000 mg | ORAL_TABLET | Freq: Two times a day (BID) | ORAL | 0 refills | Status: DC

## 2019-02-23 NOTE — Telephone Encounter (Signed)
Patient has BV and will need to have flagyl called into her pharmacy. I have sent patient a mychart message.

## 2019-02-26 ENCOUNTER — Encounter: Payer: Self-pay | Admitting: Obstetrics and Gynecology

## 2019-02-26 ENCOUNTER — Other Ambulatory Visit: Payer: Self-pay | Admitting: Obstetrics and Gynecology

## 2019-02-26 DIAGNOSIS — A599 Trichomoniasis, unspecified: Secondary | ICD-10-CM | POA: Insufficient documentation

## 2019-03-07 ENCOUNTER — Inpatient Hospital Stay (HOSPITAL_COMMUNITY)
Admission: EM | Admit: 2019-03-07 | Discharge: 2019-03-08 | Disposition: A | Discharge status: Home / Self Care | Payer: Medicaid Other | Attending: Obstetrics & Gynecology | Admitting: Obstetrics & Gynecology

## 2019-03-07 ENCOUNTER — Encounter (HOSPITAL_COMMUNITY): Payer: Self-pay | Admitting: Emergency Medicine

## 2019-03-07 ENCOUNTER — Other Ambulatory Visit: Payer: Self-pay

## 2019-03-07 DIAGNOSIS — Z3A01 Less than 8 weeks gestation of pregnancy: Secondary | ICD-10-CM | POA: Diagnosis not present

## 2019-03-07 DIAGNOSIS — O020 Blighted ovum and nonhydatidiform mole: Secondary | ICD-10-CM | POA: Diagnosis not present

## 2019-03-07 DIAGNOSIS — R109 Unspecified abdominal pain: Secondary | ICD-10-CM | POA: Insufficient documentation

## 2019-03-07 DIAGNOSIS — Z87891 Personal history of nicotine dependence: Secondary | ICD-10-CM | POA: Diagnosis not present

## 2019-03-07 DIAGNOSIS — O26899 Other specified pregnancy related conditions, unspecified trimester: Secondary | ICD-10-CM

## 2019-03-07 DIAGNOSIS — O26891 Other specified pregnancy related conditions, first trimester: Secondary | ICD-10-CM | POA: Diagnosis not present

## 2019-03-07 LAB — URINALYSIS, ROUTINE W REFLEX MICROSCOPIC
Bilirubin Urine: NEGATIVE
Glucose, UA: NEGATIVE mg/dL
Ketones, ur: NEGATIVE mg/dL
Leukocytes,Ua: NEGATIVE
Nitrite: NEGATIVE
Protein, ur: NEGATIVE mg/dL
Specific Gravity, Urine: 1.023 (ref 1.005–1.030)
pH: 6 (ref 5.0–8.0)

## 2019-03-07 LAB — COMPREHENSIVE METABOLIC PANEL
ALT: 23 U/L (ref 0–44)
AST: 21 U/L (ref 15–41)
Albumin: 3.7 g/dL (ref 3.5–5.0)
Alkaline Phosphatase: 52 U/L (ref 38–126)
Anion gap: 7 (ref 5–15)
BUN: 9 mg/dL (ref 6–20)
CO2: 20 mmol/L — ABNORMAL LOW (ref 22–32)
Calcium: 9 mg/dL (ref 8.9–10.3)
Chloride: 108 mmol/L (ref 98–111)
Creatinine, Ser: 0.56 mg/dL (ref 0.44–1.00)
GFR calc Af Amer: >60 mL/min (ref >60)
GFR calc non Af Amer: >60 mL/min (ref >60)
Glucose, Bld: 105 mg/dL — ABNORMAL HIGH (ref 70–99)
Potassium: 4 mmol/L (ref 3.5–5.1)
Sodium: 135 mmol/L (ref 135–145)
Total Bilirubin: 0.3 mg/dL (ref 0.3–1.2)
Total Protein: 7.6 g/dL (ref 6.5–8.1)

## 2019-03-07 LAB — CBC
HCT: 35.9 % — ABNORMAL LOW (ref 36.0–46.0)
Hemoglobin: 11.8 g/dL — ABNORMAL LOW (ref 12.0–15.0)
MCH: 31 pg (ref 26.0–34.0)
MCHC: 32.9 g/dL (ref 30.0–36.0)
MCV: 94.2 fL (ref 80.0–100.0)
Platelets: 383 10*3/uL (ref 150–400)
RBC: 3.81 MIL/uL — ABNORMAL LOW (ref 3.87–5.11)
RDW: 12.5 % (ref 11.5–15.5)
WBC: 9.5 10*3/uL (ref 4.0–10.5)
nRBC: 0 % (ref 0.0–0.2)

## 2019-03-07 LAB — I-STAT BETA HCG BLOOD, ED (MC, WL, AP ONLY): I-stat hCG, quantitative: >2000 m[IU]/mL — ABNORMAL HIGH (ref <5)

## 2019-03-07 LAB — LIPASE, BLOOD: Lipase: 30 U/L (ref 11–51)

## 2019-03-07 MED ORDER — SODIUM CHLORIDE 0.9% FLUSH: 3.0000 mL | Freq: Once | INTRAVENOUS | Status: DC

## 2019-03-07 NOTE — ED Notes (Signed)
Preg test +, Called Lauren in MAU and pt will go to MAU.

## 2019-03-07 NOTE — ED Triage Notes (Signed)
Patient reports intermittent low abdominal pain radiating to lower back onset 3 days ago , denies urinary discomfort , no fever or chill s.

## 2019-03-08 ENCOUNTER — Inpatient Hospital Stay (HOSPITAL_COMMUNITY): Payer: Medicaid Other

## 2019-03-08 DIAGNOSIS — Z3A01 Less than 8 weeks gestation of pregnancy: Secondary | ICD-10-CM | POA: Diagnosis not present

## 2019-03-08 DIAGNOSIS — O26891 Other specified pregnancy related conditions, first trimester: Secondary | ICD-10-CM | POA: Diagnosis not present

## 2019-03-08 DIAGNOSIS — R109 Unspecified abdominal pain: Secondary | ICD-10-CM | POA: Diagnosis not present

## 2019-03-08 MED ORDER — ACETAMINOPHEN 500 MG PO TABS
1000.0000 mg | ORAL_TABLET | Freq: Once | ORAL | Status: AC
  Administered 2019-03-08: 1000 mg via ORAL
  Filled 2019-03-08: qty 2

## 2019-03-08 NOTE — MAU Note (Signed)
Here for lower abdominal pain.  No VB.  Found out she was pregnant in the ED & was sent here for evaluation.

## 2019-03-08 NOTE — MAU Provider Note (Signed)
History     CSN: [id number]  Arrival date and time: 03/07/19 2131   First Provider Initiated Contact with Patient 03/07/19 2358      Chief Complaint  Patient presents with  . Abdominal Pain  . Back Pain   [name] is a 28 y.o. G5P2 at [redacted]w[redacted]d by LMP who presents to MAU with complaints abdominal pain. She reports abdominal pain started occurring 3 days ago, describes as lower abdominal cramping that radiates to her back. Rates pain 7/10- has taken Tylenol for pain yesterday with relief, denies taking medication today. She denies vaginal bleeding or discharge. Was treated for BV on 8/26. She denies urinary symptoms. She presented to [facility] for abdominal pain and was sent to MAU after finding out she was pregnant.    OB History    Gravida  5   Para  2   Term  2   Preterm  0   AB  2   Living  2     SAB  1   TAB  0   Ectopic  0   Multiple  0   Live Births  2           Past Medical History:  Diagnosis Date  . BV (bacterial vaginosis)   . Trichomonas     Past Surgical History:  Procedure Laterality Date  . DILATION AND CURETTAGE OF UTERUS      Family History  Problem Relation Age of Onset  . Cancer Brother   . Hypertension Maternal Grandfather   . Diabetes Brother   . Heart disease Neg Hx     Social History   Tobacco Use  . Smoking status: Former Smoker    Quit date: 04/02/2013    Years since quitting: 5.9  . Smokeless tobacco: Former Network engineer Use Topics  . Alcohol use: No    Alcohol/week: 0.0 standard drinks    Comment: occasionally before pregnancy  . Drug use: No    Allergies:  Allergies  Allergen Reactions  . Latex Rash  . Sulfa Antibiotics Hives    Medications Prior to Admission  Medication Sig Dispense Refill Last Dose  . acetaminophen (TYLENOL) 500 MG tablet Take 1,000 mg by mouth every 6 (six) hours as needed for mild pain, moderate pain or headache.    03/07/2019 at 0800  . ibuprofen (ADVIL) 600 MG tablet 600mg  by mount  every 6 hours for 3 days 30 tablet 0   . LO LOESTRIN FE 1 MG-10 MCG / 10 MCG tablet Take 1 tablet by mouth daily. 3 Package 3   . metroNIDAZOLE (FLAGYL) 500 MG tablet Take 1 tablet (500 mg total) by mouth 2 (two) times daily. 14 tablet 0   . Prenat-FeAsp-Meth-FA-DHA w/o A (PRENATE PIXIE) 10-0.6-0.4-200 MG CAPS Take 1 capsule by mouth at bedtime.       Review of Systems  Constitutional: Negative.   Respiratory: Negative.   Cardiovascular: Negative.   Gastrointestinal: Positive for abdominal pain. Negative for constipation, diarrhea, nausea and vomiting.  Genitourinary: Negative.   Musculoskeletal: Positive for back pain.  Neurological: Negative.    Physical Exam   Blood pressure 129/68, pulse 88, temperature 98 F (36.7 C), resp. rate 19, last menstrual period 01/28/2019, SpO2 99 %, unknown if currently breastfeeding.  Physical Exam  Nursing note and vitals reviewed. Constitutional: She is oriented to person, place, and time. She appears well-developed and well-nourished. No distress.  Cardiovascular: Normal rate and regular rhythm.  Respiratory: Effort normal and breath  sounds normal. No respiratory distress. She has no wheezes.  GI: Soft. There is abdominal tenderness. There is no rebound and no guarding.  Tenderness in LLQ   Genitourinary:    Genitourinary Comments: Blind swabs obtained, negative CMT on bimanual examination, no mass palpated in adnexa    Musculoskeletal: Normal range of motion.        General: No edema.  Neurological: She is alert and oriented to person, place, and time.  Psychiatric: She has a normal mood and affect. Her behavior is normal. Thought content normal.    MAU Course  Procedures  MDM GC/C and US ordered in MAU  [facility]   Upon chart review patient tested positive for trich on 8/24- resulted on 8/28 after being treated for BV. Patient unaware of +trich- medication for BV covered treatment for trich, no additional  medication needed. Patient denies IC with partner since prior to treatment for BV. Educated and discussed importance of having partner treated for trich.   GC/C pending  US report reviewed:   [place] Ob Less Than 14 Weeks With Ob Transvaginal  Result Date: 03/08/2019 CLINICAL DATA:  Abdominal cramping EXAM: OBSTETRIC <14 WK US AND TRANSVAGINAL OB US TECHNIQUE: Both transabdominal and transvaginal ultrasound examinations were performed for complete evaluation of the gestation as well as the maternal uterus, adnexal regions, and pelvic cul-de-sac. Transvaginal technique was performed to assess early pregnancy. COMPARISON:  None. FINDINGS: Intrauterine gestational sac: Single Yolk sac:  Visualized Embryo:  Not visualized Cardiac Activity: Not visualized Heart Rate:   bpm MSD: 8.7 mm   5 w   4 d CRL:    mm    w    d                  US EDC: Subchorionic hemorrhage:  None visualized. Maternal uterus/adnexae: No adnexal mass or free fluid. IMPRESSION: Early intrauterine gestational sac, 5 weeks 4 days by mean sac diameter. No fetal pole currently. This could be followed with repeat ultrasound in 14 days to ensure expected progression. No acute maternal findings. Electronically Signed   By: Charlett NoseKevin  Dover M.D.   On: 03/08/2019 00:46   Discussed results of US with patient. Educated on importance of follow up HCG in 48 hours, patient to return to MAU on Saturday morning for repeat labs. Threatened miscarriage precautions given to patient and reasons to return to MAU. Pt stable at time of discharge.   Assessment and Plan   1. Empty gestational sac with ongoing pregnancy   2. Abdominal pain during pregnancy    Discharge home Return to MAU on Saturday morning for repeat HCG  Return to MAU as needed for reasons discussed  Importance of partner being treated for trich and to abstain from IC for 10 days after he is treated.   Follow-up Information    [facility]. Go on 03/10/2019.   Specialty:  Obstetrics and Gynecology Why: Return on Saturday around 0900 for repeat HCG  Contact information: [address] [id number] mc [address] [id number] [phone]         Sharyon CableVeronica C Obed Samek CNM 03/08/2019, 1:28 AM

## 2019-03-09 LAB — GC/CHLAMYDIA PROBE AMP (~~LOC~~) NOT AT ARMC
Chlamydia: NEGATIVE
Neisseria Gonorrhea: NEGATIVE

## 2019-04-10 ENCOUNTER — Encounter: Payer: Medicaid Other | Admitting: Obstetrics and Gynecology

## 2019-07-24 ENCOUNTER — Other Ambulatory Visit: Payer: Self-pay

## 2019-07-24 MED ORDER — METRONIDAZOLE 500 MG PO TABS: 500.0000 mg | ORAL_TABLET | Freq: Two times a day (BID) | ORAL | 0 refills | Status: DC

## 2019-08-22 ENCOUNTER — Other Ambulatory Visit: Payer: Self-pay

## 2019-08-22 NOTE — Telephone Encounter (Signed)
Patient is requesting a refill on her birth control.

## 2019-08-23 ENCOUNTER — Other Ambulatory Visit: Payer: Self-pay | Admitting: *Deleted

## 2019-08-23 MED ORDER — LO LOESTRIN FE 1 MG-10 MCG / 10 MCG PO TABS: 1.0000 | ORAL_TABLET | Freq: Every day | ORAL | 3 refills | Status: DC

## 2019-12-04 ENCOUNTER — Encounter: Payer: Self-pay | Admitting: Radiology

## 2021-01-26 ENCOUNTER — Other Ambulatory Visit: Payer: Self-pay | Admitting: Obstetrics and Gynecology

## 2022-03-27 ENCOUNTER — Encounter (HOSPITAL_COMMUNITY): Payer: Self-pay | Admitting: Obstetrics and Gynecology

## 2022-03-27 ENCOUNTER — Inpatient Hospital Stay (HOSPITAL_COMMUNITY)
Admission: AD | Admit: 2022-03-27 | Discharge: 2022-03-27 | Disposition: A | Discharge status: Home / Self Care | Payer: 59 | Attending: Obstetrics and Gynecology | Admitting: Obstetrics and Gynecology

## 2022-03-27 ENCOUNTER — Inpatient Hospital Stay (HOSPITAL_COMMUNITY): Payer: 59

## 2022-03-27 DIAGNOSIS — O26891 Other specified pregnancy related conditions, first trimester: Secondary | ICD-10-CM | POA: Diagnosis present

## 2022-03-27 DIAGNOSIS — O23591 Infection of other part of genital tract in pregnancy, first trimester: Secondary | ICD-10-CM | POA: Diagnosis not present

## 2022-03-27 DIAGNOSIS — N760 Acute vaginitis: Secondary | ICD-10-CM | POA: Diagnosis not present

## 2022-03-27 DIAGNOSIS — B9689 Other specified bacterial agents as the cause of diseases classified elsewhere: Secondary | ICD-10-CM

## 2022-03-27 DIAGNOSIS — Z3A01 Less than 8 weeks gestation of pregnancy: Secondary | ICD-10-CM

## 2022-03-27 DIAGNOSIS — Z3491 Encounter for supervision of normal pregnancy, unspecified, first trimester: Secondary | ICD-10-CM

## 2022-03-27 LAB — CBC
HCT: 34.9 % — ABNORMAL LOW (ref 36.0–46.0)
Hemoglobin: 11.7 g/dL — ABNORMAL LOW (ref 12.0–15.0)
MCH: 31.1 pg (ref 26.0–34.0)
MCHC: 33.5 g/dL (ref 30.0–36.0)
MCV: 92.8 fL (ref 80.0–100.0)
Platelets: 397 10*3/uL (ref 150–400)
RBC: 3.76 MIL/uL — ABNORMAL LOW (ref 3.87–5.11)
RDW: 11.9 % (ref 11.5–15.5)
WBC: 9.1 10*3/uL (ref 4.0–10.5)
nRBC: 0 % (ref 0.0–0.2)

## 2022-03-27 LAB — URINALYSIS, ROUTINE W REFLEX MICROSCOPIC
Bilirubin Urine: NEGATIVE
Glucose, UA: NEGATIVE mg/dL
Hgb urine dipstick: NEGATIVE
Ketones, ur: NEGATIVE mg/dL
Leukocytes,Ua: NEGATIVE
Nitrite: NEGATIVE
Protein, ur: NEGATIVE mg/dL
Specific Gravity, Urine: 1.02 (ref 1.005–1.030)
pH: 6 (ref 5.0–8.0)

## 2022-03-27 LAB — WET PREP, GENITAL
Sperm: NONE SEEN
Trich, Wet Prep: NONE SEEN
WBC, Wet Prep HPF POC: <10 (ref <10)
Yeast Wet Prep HPF POC: NONE SEEN

## 2022-03-27 LAB — POCT PREGNANCY, URINE: Preg Test, Ur: POSITIVE — AB

## 2022-03-27 LAB — HCG, QUANTITATIVE, PREGNANCY: hCG, Beta Chain, Quant, S: 6894 m[IU]/mL — ABNORMAL HIGH (ref <5)

## 2022-03-27 MED ORDER — METRONIDAZOLE 500 MG PO TABS: 500.0000 mg | ORAL_TABLET | Freq: Two times a day (BID) | ORAL | 0 refills | Status: DC

## 2022-03-27 NOTE — MAU Provider Note (Signed)
History     CSN: [id number]  Arrival date and time: 03/27/22 2115   Event Date/Time   First Provider Initiated Contact with Patient 03/27/22 2148      Chief Complaint  Patient presents with   Abdominal Pain   HPI  [name] is a 31 y.o. [id number] at [redacted]w[redacted]d who presents for evaluation of lower abdominal pain. Patient reports the pain started 2-3 days ago and has stayed the same. Patient rates the pain as a 5/10 and has tried tylenol for the pain with no relief. She reports 2 days ago she had vaginal spotting, none since then. Denies any constipation, diarrhea or any urinary complaints.   OB History     Gravida  6   Para  2   Term  2   Preterm  0   AB  2   Living  2      SAB  1   IAB  0   Ectopic  0   Multiple  0   Live Births  2           Past Medical History:  Diagnosis Date   BV (bacterial vaginosis)    Trichomonas     Past Surgical History:  Procedure Laterality Date   DILATION AND CURETTAGE OF UTERUS      Family History  Problem Relation Age of Onset   Cancer Brother    Hypertension Maternal Grandfather    Diabetes Brother    Heart disease Neg Hx     Social History   Tobacco Use   Smoking status: Former    Types: Cigarettes    Quit date: 04/02/2013    Years since quitting: 8.9   Smokeless tobacco: Former  Scientific laboratory technician Use: Never used  Substance Use Topics   Alcohol use: No    Alcohol/week: 0.0 standard drinks of alcohol    Comment: occasionally before pregnancy   Drug use: No    Allergies:  Allergies  Allergen Reactions   Latex Rash   Sulfa Antibiotics Hives    No medications prior to admission.    Review of Systems  Constitutional: Negative.  Negative for fatigue and fever.  HENT: Negative.    Respiratory: Negative.  Negative for shortness of breath.   Cardiovascular: Negative.  Negative for chest pain.  Gastrointestinal:  Positive for abdominal pain. Negative for constipation, diarrhea, nausea and  vomiting.  Genitourinary: Negative.  Negative for dysuria, vaginal bleeding and vaginal discharge.  Neurological: Negative.  Negative for dizziness and headaches.   Physical Exam   Blood pressure 124/74, pulse 68, temperature 98 F (36.7 C), temperature source Oral, resp. rate 18, height 5\' 2"  (1.575 m), weight 68.7 kg, last menstrual period 02/15/2022, SpO2 100 %, unknown if currently breastfeeding.  Patient Vitals for the past 24 hrs:  BP Temp Temp src Pulse Resp SpO2 Height Weight  03/27/22 2309 124/74 -- -- 68 18 100 % -- --  03/27/22 2129 108/68 98 F (36.7 C) Oral 78 15 99 % 5\' 2"  (1.575 m) 68.7 kg    Physical Exam Vitals and nursing note reviewed.  Constitutional:      General: She is not in acute distress.    Appearance: She is well-developed.  HENT:     Head: Normocephalic.  Eyes:     Pupils: Pupils are equal, round, and reactive to light.  Cardiovascular:     Rate and Rhythm: Normal rate and regular rhythm.     Heart sounds: Normal  heart sounds.  Pulmonary:     Effort: Pulmonary effort is normal. No respiratory distress.     Breath sounds: Normal breath sounds.  Abdominal:     General: Bowel sounds are normal. There is no distension.     Palpations: Abdomen is soft.     Tenderness: There is abdominal tenderness in the right lower quadrant, suprapubic area and left lower quadrant.  Skin:    General: Skin is warm and dry.  Neurological:     Mental Status: She is alert and oriented to person, place, and time.  Psychiatric:        Mood and Affect: Mood normal.        Behavior: Behavior normal.        Thought Content: Thought content normal.        Judgment: Judgment normal.     MAU Course  Procedures  Results for orders placed or performed during the hospital encounter of 03/27/22 (from the past 24 hour(s))  Pregnancy, urine POC     Status: Abnormal   Collection Time: 03/27/22  9:33 PM  Result Value Ref Range   Preg Test, Ur POSITIVE (A) NEGATIVE  Wet  prep, genital     Status: Abnormal   Collection Time: 03/27/22  9:36 PM  Result Value Ref Range   Yeast Wet Prep HPF POC NONE SEEN NONE SEEN   Trich, Wet Prep NONE SEEN NONE SEEN   Clue Cells Wet Prep HPF POC PRESENT (A) NONE SEEN   WBC, Wet Prep HPF POC <10 <10   Sperm NONE SEEN   CBC     Status: Abnormal   Collection Time: 03/27/22  9:41 PM  Result Value Ref Range   WBC 9.1 4.0 - 10.5 K/uL   RBC 3.76 (L) 3.87 - 5.11 MIL/uL   Hemoglobin 11.7 (L) 12.0 - 15.0 g/dL   HCT 08.6 (L) 57.8 - 46.9 %   MCV 92.8 80.0 - 100.0 fL   MCH 31.1 26.0 - 34.0 pg   MCHC 33.5 30.0 - 36.0 g/dL   RDW 62.9 52.8 - 41.3 %   Platelets 397 150 - 400 K/uL   nRBC 0.0 0.0 - 0.2 %  hCG, quantitative, pregnancy     Status: Abnormal   Collection Time: 03/27/22  9:41 PM  Result Value Ref Range   hCG, Beta Chain, Quant, S 6,894 (H) <5 mIU/mL  Urinalysis, Routine w reflex microscopic     Status: Abnormal   Collection Time: 03/27/22  9:51 PM  Result Value Ref Range   Color, Urine YELLOW YELLOW   APPearance HAZY (A) CLEAR   Specific Gravity, Urine 1.020 1.005 - 1.030   pH 6.0 5.0 - 8.0   Glucose, UA NEGATIVE NEGATIVE mg/dL   Hgb urine dipstick NEGATIVE NEGATIVE   Bilirubin Urine NEGATIVE NEGATIVE   Ketones, ur NEGATIVE NEGATIVE mg/dL   Protein, ur NEGATIVE NEGATIVE mg/dL   Nitrite NEGATIVE NEGATIVE   Leukocytes,Ua NEGATIVE NEGATIVE     US OB LESS THAN 14 WEEKS WITH OB TRANSVAGINAL  Result Date: 03/27/2022 CLINICAL DATA:  Cramping and spotting; beta HCG pending: GA by LMP: 5 weeks 5 days EXAM: OBSTETRIC <14 WK [place] AND TRANSVAGINAL OB US TECHNIQUE: Both transabdominal and transvaginal ultrasound examinations were performed for complete evaluation of the gestation as well as the maternal uterus, adnexal regions, and pelvic cul-de-sac. Transvaginal technique was performed to assess early pregnancy. COMPARISON:  None available. FINDINGS: Intrauterine gestational sac: Single Yolk sac:  Visualized. Embryo:  Not  Visualized.  Cardiac Activity: Not applicable Heart Rate: Not applicable bpm MSD: 11.2 mm  GA: 5 w   6 d Subchorionic hemorrhage:  None visualized. Maternal uterus/adnexae: Normal appearance of the bilateral ovaries. Small amount of free fluid in the pelvis likely physiologic. IMPRESSION: Single intrauterine gestational sac measuring 11.2 mm corresponding with a gestational age of [redacted] weeks 6 days. No unexpected findings. Electronically Signed   By: Minerva Fester M.D.   On: 03/27/2022 23:28     MDM Labs ordered and reviewed.   UA, UPT CBC, HCG ABO/Rh- A Pos Wet prep and gc/chlamydia US OB Comp Less 14 weeks with Transvaginal  CNM independently reviewed the imaging ordered. Imaging show gestational sac with yolk sac  Assessment and Plan   1. Normal intrauterine pregnancy on prenatal ultrasound in first trimester   2. [redacted] weeks gestation of pregnancy   3. Bacterial vaginosis     -Discharge home in stable condition -Rx for metronidazole sent to pharmacy -First trimester precautions discussed -Patient advised to follow-up with [facility] in 10 days for repeat ultrasound -Patient may return to MAU as needed or if her condition were to change or worsen  Rolm Bookbinder, CNM 03/27/2022, 9:48 PM

## 2022-03-27 NOTE — Discharge Instructions (Signed)

## 2022-03-27 NOTE — MAU Note (Signed)
.  [name] is a 31 y.o. at Unknown here in MAU reporting: lower abdominal cramping that began about two days ago. Had vaginal spotting 2 days ago but none now.  Had a positive pregnancy at home test last week.  LMP: 02/15/2022  Pain score: 8/10 Vitals:   03/27/22 2129  BP: 108/68  Pulse: 78  Resp: 15  Temp: 98 F (36.7 C)  SpO2: 99%      Lab orders placed from triage:  pregnancy test

## 2022-03-29 LAB — GC/CHLAMYDIA PROBE AMP (~~LOC~~) NOT AT ARMC
Chlamydia: POSITIVE — AB
Comment: NEGATIVE
Comment: NORMAL
Neisseria Gonorrhea: NEGATIVE

## 2022-03-31 ENCOUNTER — Telehealth (HOSPITAL_COMMUNITY): Payer: Self-pay

## 2022-03-31 MED ORDER — AZITHROMYCIN 500 MG PO TABS: 1000.0000 mg | ORAL_TABLET | Freq: Once | ORAL | 0 refills | Status: AC

## 2022-04-06 ENCOUNTER — Other Ambulatory Visit: Payer: Self-pay

## 2022-04-06 ENCOUNTER — Ambulatory Visit (INDEPENDENT_AMBULATORY_CARE_PROVIDER_SITE_OTHER): Payer: 59

## 2022-04-06 ENCOUNTER — Other Ambulatory Visit: Payer: Self-pay | Admitting: *Deleted

## 2022-04-06 DIAGNOSIS — O3680X0 Pregnancy with inconclusive fetal viability, not applicable or unspecified: Secondary | ICD-10-CM

## 2022-04-06 DIAGNOSIS — Z3A01 Less than 8 weeks gestation of pregnancy: Secondary | ICD-10-CM | POA: Diagnosis not present

## 2022-04-06 DIAGNOSIS — Z3490 Encounter for supervision of normal pregnancy, unspecified, unspecified trimester: Secondary | ICD-10-CM | POA: Diagnosis not present

## 2022-04-06 NOTE — Progress Notes (Signed)
   GYNECOLOGY OFFICE VISIT NOTE  History:   Brandin Dilday Truszkowski is a 31 y.o. [id number] here today for ultrasound results.  Ultrasound shows live IUP consistent with LMP with FHR 104 bpm. Appropriate growth in pregnancy since last scan  Abdominal pain: No   Vaginal bleeding: No   OB History  Gravida Para Term Preterm AB Living  6 2 2  0 2 2  SAB IAB Ectopic Multiple Live Births  1 0 0 0 2    # Outcome Date GA Lbr Len/2nd Weight Sex Delivery Anes PTL Lv  6 Current           5 Term 09/05/16 [redacted]w[redacted]d 10:07 / 00:24 6 lb 11.8 oz (3.055 kg) M Vag-Spont EPI  LIV  4 Term 12/06/13 [redacted]w[redacted]d 10:43 / 00:44 7 lb 0.2 oz (3.181 kg) M Vag-Spont Local, EPI  LIV  3 AB 06/28/09 [redacted]w[redacted]d         2 Gravida           1 SAB              Health Maintenance Due  Topic Date Due   COVID-19 Vaccine (1) Never done   INFLUENZA VACCINE  Never done   PAP SMEAR-Modifier  02/18/2022    Past Medical History:  Diagnosis Date   BV (bacterial vaginosis)    Trichomonas     Past Surgical History:  Procedure Laterality Date   DILATION AND CURETTAGE OF UTERUS      The following portions of the patient's history were reviewed and updated as appropriate: allergies, current medications, past family history, past medical history, past social history, past surgical history and problem list.   Review of Systems:  Pertinent items noted in HPI and remainder of comprehensive ROS otherwise negative.  Physical Exam:  LMP 02/15/2022  BP 122/86, P 94, Temp 97.8 CONSTITUTIONAL: Well-developed, well-nourished female in no acute distress.  HEENT:  Normocephalic, atraumatic. External right and left ear normal. No scleral icterus.  NECK: Normal range of motion, supple, no masses noted on observation SKIN: No rash noted. Not diaphoretic. No erythema. No pallor. MUSCULOSKELETAL: Normal range of motion. No edema noted. NEUROLOGIC: Alert and oriented to person, place, and time. Normal muscle tone coordination.  PSYCHIATRIC: Normal mood and  affect. Normal behavior. Normal judgment and thought content. RESPIRATORY: Effort normal, no problems with respiration noted  Assessment and Plan:  Viable Pregnancy - Z34.90  Reviewed results with patient that show a viable intrauterine pregnancy at 7 weeks of gestation. Recommended she contact providers to start prenatal care, and she was given a list of options in her AVS. Prescription sent for prenatal vitamins. Reviewed ED precautions including vaginal bleeding like a period or heavier, severe abdominal pain, and fever. All questions answered.   Please refer to After Visit Summary for other counseling recommendations.   Patient to follow up OB to establish prenatal care as soon as possible, list given  Total face-to-face time with patient: 20 minutes.  Over 50% of encounter was spent on counseling and coordination of care.   Wende Mott, [place] for [employer], [place]

## 2022-04-06 NOTE — Patient Instructions (Addendum)
Safe Medications in Pregnancy    Acne: Benzoyl Peroxide Salicylic Acid  Backache/Headache: Tylenol: 2 regular strength every 4 hours OR              2 Extra strength every 6 hours  Colds/Coughs/Allergies: Benadryl (alcohol free) 25 mg every 6 hours as needed Breath right strips Claritin Cepacol throat lozenges Chloraseptic throat spray Cold-Eeze- up to three times per day Cough drops, alcohol free Flonase (by prescription only) Guaifenesin Mucinex Robitussin DM (plain only, alcohol free) Saline nasal spray/drops Sudafed (pseudoephedrine) & Actifed ** use only after [redacted] weeks gestation and if you do not have high blood pressure Tylenol Vicks Vaporub Zinc lozenges Zyrtec   Constipation: Colace Ducolax suppositories Fleet enema Glycerin suppositories Metamucil Milk of magnesia Miralax Senokot Smooth move tea  Diarrhea: Kaopectate Imodium A-D  *NO pepto Bismol  Hemorrhoids: Anusol Anusol HC Preparation H Tucks  Indigestion: Tums Maalox Mylanta Zantac  Pepcid  Insomnia: Benadryl (alcohol free) 25mg every 6 hours as needed Tylenol PM Unisom, no Gelcaps  Leg Cramps: Tums MagGel  Nausea/Vomiting:  Bonine Dramamine Emetrol Ginger extract Sea bands Meclizine  Nausea medication to take during pregnancy:  Unisom (doxylamine succinate 25 mg tablets) Take one tablet daily at bedtime. If symptoms are not adequately controlled, the dose can be increased to a maximum recommended dose of two tablets daily (1/2 tablet in the morning, 1/2 tablet mid-afternoon and one at bedtime). Vitamin B6 100mg tablets. Take one tablet twice a day (up to 200 mg per day).  Skin Rashes: Aveeno products Benadryl cream or 25mg every 6 hours as needed Calamine Lotion 1% cortisone cream  Yeast infection: Gyne-lotrimin 7 Monistat 7   **If taking multiple medications, please check labels to avoid duplicating the same active ingredients **take  medication as directed on the label ** Do not exceed 4000 mg of tylenol in 24 hours **Do not take medications that contain aspirin or ibuprofen   Prenatal Care Providers           [facility]  [address] [phone]  [facility] @ [facility]   [address]  [phone]  [facility] @ [place]       [address] [phone]            [facility] @ [place]     [address] [phone]          [facility] @ [place]   [address] [phone]  [facility] @ [facility]  [address] [phone]     [facility] @ [facility]  [address]   [phone]     [facility]  Phone: [phone]  [facility]  Phone: [phone]  [facility] Phone: [phone]  [facility] Phone: [phone]  [facility] Phone: [phone]  [facility] Phone: [phone]  [facility]  Phone: [phone]  

## 2022-04-12 ENCOUNTER — Telehealth: Payer: Self-pay | Admitting: *Deleted

## 2022-04-12 DIAGNOSIS — O3680X0 Pregnancy with inconclusive fetal viability, not applicable or unspecified: Secondary | ICD-10-CM

## 2022-04-12 NOTE — Telephone Encounter (Signed)
Received VM message from [employer] [facility].  She stated that pt had ultrasound @ their location on 10/3. Results showed fetal pole measuring [redacted]w[redacted]d and FHR of 94. Pt returned for follow up ultrasound today and results showed fetal pole measuring [redacted]w[redacted]d with absent FHR. Pt requires follow up. She denies bleeding or cramping. Per chart review, pt also had [place] in our office on 10/10 which showed +FHR of 104 bpm. I called pt and informed her of ultrasound scheduled on 10/18 @ 3 pm.  She will need to arrive @ 2:45 and have a full bladder. Pt was advised that she should go to [facility] if she develops heavy vaginal bleeding or strong abdominal pain. Pt voiced understanding of all information and instructions given.

## 2022-04-14 ENCOUNTER — Telehealth: Payer: Self-pay

## 2022-04-14 ENCOUNTER — Encounter: Payer: Self-pay | Admitting: Family Medicine

## 2022-04-14 ENCOUNTER — Ambulatory Visit
Admission: RE | Admit: 2022-04-14 | Discharge: 2022-04-14 | Disposition: A | Discharge status: Home / Self Care | Payer: 59 | Source: Ambulatory Visit | Attending: Obstetrics & Gynecology | Admitting: Obstetrics & Gynecology

## 2022-04-14 ENCOUNTER — Ambulatory Visit (INDEPENDENT_AMBULATORY_CARE_PROVIDER_SITE_OTHER): Payer: 59 | Admitting: Family Medicine

## 2022-04-14 DIAGNOSIS — O021 Missed abortion: Secondary | ICD-10-CM | POA: Diagnosis not present

## 2022-04-14 DIAGNOSIS — Z3A01 Less than 8 weeks gestation of pregnancy: Secondary | ICD-10-CM | POA: Diagnosis not present

## 2022-04-14 DIAGNOSIS — O3680X0 Pregnancy with inconclusive fetal viability, not applicable or unspecified: Secondary | ICD-10-CM | POA: Diagnosis not present

## 2022-04-14 DIAGNOSIS — O039 Complete or unspecified spontaneous abortion without complication: Secondary | ICD-10-CM | POA: Diagnosis not present

## 2022-04-14 NOTE — Progress Notes (Signed)
GYNECOLOGY OFFICE VISIT NOTE  History:   [name] is a 31 y.o. [id number] here today for [place] results.  Patient had MAU [place] on 03/27/2022 that showed yolk sac Follow up US on 04/06/2022 showed viable IUP measuring about 7 weeks [place] today shows no fetal cardiac activity and no interval growth c/w missed AB  Patient very sad Reports she has tried misoprostol in prior pregnancy and did not work, had to have emergent D&C  Health Maintenance Due  Topic Date Due   COVID-19 Vaccine (1) Never done   INFLUENZA VACCINE  Never done    Past Medical History:  Diagnosis Date   BV (bacterial vaginosis)    Trichomonas     Past Surgical History:  Procedure Laterality Date   DILATION AND CURETTAGE OF UTERUS      The following portions of the patient's history were reviewed and updated as appropriate: allergies, current medications, past family history, past medical history, past social history, past surgical history and problem list.   Health Maintenance:   Last pap: Lab Results  Component Value Date   DIAGPAP  02/19/2019    NEGATIVE FOR INTRAEPITHELIAL LESIONS OR MALIGNANCY.   DIAGPAP TRICHOMONAS VAGINALIS PRESENT. 02/19/2019   HPV NOT Detected 02/19/2019     Last mammogram:  N/a    Review of Systems:  Pertinent items noted in HPI and remainder of comprehensive ROS otherwise negative.  Physical Exam:  BP 117/75   Pulse 76   Wt 155 lb 3.2 oz (70.4 kg)   LMP 02/15/2022   BMI 28.39 kg/m  CONSTITUTIONAL: Well-developed, well-nourished female in no acute distress.  HEENT:  Normocephalic, atraumatic. External right and left ear normal. No scleral icterus.  NECK: Normal range of motion, supple, no masses noted on observation SKIN: No rash noted. Not diaphoretic. No erythema. No pallor. MUSCULOSKELETAL: Normal range of motion. No edema noted. NEUROLOGIC: Alert and oriented to person, place, and time. Normal muscle tone coordination.  PSYCHIATRIC: Normal mood and affect. Normal  behavior. Normal judgment and thought content. RESPIRATORY: Effort normal, no problems with respiration noted   Labs and Imaging No results found for this or any previous visit (from the past 168 hour(s)). US OB Transvaginal  Result Date: 04/14/2022 CLINICAL DATA:  Viability. Report of ultrasound 2 days ago without fetal heart rate. EXAM: TRANSVAGINAL OB ULTRASOUND TECHNIQUE: Transvaginal ultrasound was performed for complete evaluation of the gestation as well as the maternal uterus, adnexal regions, and pelvic cul-de-sac. COMPARISON:  Ob ultrasound 04/06/2022 FINDINGS: Intrauterine gestational sac: Single Yolk sac:  Present Embryo:  Present Cardiac Activity: No visible cardiac activity. CRL:   7.8 mm   6 w 5 d Subchorionic hemorrhage:  None visualized. Maternal uterus/adnexae: Unremarkable IMPRESSION: 1. Missed abortion. No cardiac activity and no significant interval growth. This confirms the absent fetal heart rate reported 2 days ago. Electronically Signed   By: [name] Morelle M.D.   On: 04/14/2022 15:21   US OB Transvaginal  Result Date: 04/06/2022 CLINICAL DATA:  31 yo [id number] at [redacted]w[redacted]d by LMP for ultrasound for viability. Exam: OBSTETRIC <14 WK [place] and TRANSVAGINAL OB US Technique:  Transvaginal ultrasound examination was performed for complete evaluation of the gestation as well at the maternal uterus, adnexal regions, and pelvic cul-de-sac.  Transvaginal technique was performed to assess early pregnancy. Comparison:  03/27/22 Findings: [name] IUP noted Yolk sac: visualized Embryo: visualized Cardiac activity: visualized, FHR 104 CRL: 8.4 mm   [place] EDC:  11/25/2022 Cervix: long, closed Adnexa: Normal bilaterally  Subchorionic hemorrhage:  None Other findings:  Trace free fluid Impression: Single live IUP at [redacted]w[redacted]d by [facility] 5/27 by LMP confirmed by ultrasound. Recommendations: Follow up as clinically indicated.   US OB LESS THAN 14 WEEKS WITH OB TRANSVAGINAL  Result Date: 03/27/2022 CLINICAL  DATA:  Cramping and spotting; beta HCG pending: GA by LMP: 5 weeks 5 days EXAM: OBSTETRIC <14 WK [place] AND TRANSVAGINAL OB US TECHNIQUE: Both transabdominal and transvaginal ultrasound examinations were performed for complete evaluation of the gestation as well as the maternal uterus, adnexal regions, and pelvic cul-de-sac. Transvaginal technique was performed to assess early pregnancy. COMPARISON:  None available. FINDINGS: Intrauterine gestational sac: Single Yolk sac:  Visualized. Embryo:  Not Visualized. Cardiac Activity: Not applicable Heart Rate: Not applicable bpm MSD: 11.2 mm  GA: 5 w   6 d Subchorionic hemorrhage:  None visualized. Maternal uterus/adnexae: Normal appearance of the bilateral ovaries. Small amount of free fluid in the pelvis likely physiologic. IMPRESSION: Single intrauterine gestational sac measuring 11.2 mm corresponding with a gestational age of [redacted] weeks 6 days. No unexpected findings. Electronically Signed   By: Minerva Fester M.D.   On: 03/27/2022 23:28      Assessment and Plan:   Problem List Items Addressed This Visit       Other   SAB (spontaneous abortion)    Patient did not have any questions about her results. Reviewed options of expectant, medical, or surgical management. After counseling she elected for surgical management. Message sent to scheduler. Advised patient to expect phone call in the next few days. . Blood type A positive on multiple prior evaluations, rhogam was not indicated. Did not discuss contraception with patient as she was visibly upset. Given MAU precautions in event she begins to have spontaneous passage of pregnancy.        Routine preventative health maintenance measures emphasized. Please refer to After Visit Summary for other counseling recommendations.   Return as needed.    Total face-to-face time with patient: 20 minutes.  Over 50% of encounter was spent on counseling and coordination of care.   Venora Maples, MD/MPH Attending  Family Medicine Physician, [facility] for [facility], [facility]

## 2022-04-14 NOTE — Telephone Encounter (Signed)
Called patient, surgery date, time and preop instructions given, patient expressed understanding.  Patient wants Anora. Added to OR posting.

## 2022-04-14 NOTE — Assessment & Plan Note (Addendum)
Patient did not have any questions about her results. Reviewed options of expectant, medical, or surgical management. After counseling she elected for surgical management. Message sent to scheduler. Advised patient to expect phone call in the next few days. . Blood type A positive on multiple prior evaluations, rhogam was not indicated. Did not discuss contraception with patient as she was visibly upset. Given MAU precautions in event she begins to have spontaneous passage of pregnancy.

## 2022-04-15 ENCOUNTER — Encounter: Payer: Self-pay | Admitting: Family Medicine

## 2022-04-15 NOTE — Progress Notes (Signed)
Called pt around 5 PM today for a pre-op call. She states that she has decided not to have the surgery and "do the pills instead". I notified Dr. [name] via Secure chat and told her this and also told her that she needed to cancel the surgery with OR. She stated that she would call pt. I just noticed that the patient is still on the OR schedule. I called the patient again and she states she is NOT having the surgery tomorrow.

## 2022-04-16 ENCOUNTER — Ambulatory Visit (HOSPITAL_COMMUNITY): Admission: RE | Admit: 2022-04-16 | Payer: 59 | Source: Home / Self Care | Admitting: Obstetrics & Gynecology

## 2022-04-16 ENCOUNTER — Other Ambulatory Visit: Payer: Self-pay | Admitting: Obstetrics & Gynecology

## 2022-04-16 ENCOUNTER — Encounter (HOSPITAL_COMMUNITY): Admission: RE | Payer: Self-pay | Source: Home / Self Care

## 2022-04-16 DIAGNOSIS — O039 Complete or unspecified spontaneous abortion without complication: Secondary | ICD-10-CM

## 2022-04-16 SURGERY — DILATION AND EVACUATION, UTERUS
Anesthesia: Choice

## 2022-04-16 MED ORDER — MISOPROSTOL 200 MCG PO TABS: ORAL_TABLET | ORAL | 0 refills | Status: DC

## 2022-04-16 NOTE — Progress Notes (Signed)
Spoke with patient does not want surgery.  Would prefer medication.  Rx sent in for cytotec.  Advised to call office in one week to schedule follow up.  Janyth Pupa, DO Attending [place], [facility] for [employer], [place]

## 2022-04-28 ENCOUNTER — Ambulatory Visit: Payer: 59 | Admitting: Obstetrics & Gynecology

## 2022-06-28 NOTE — L&D Delivery Note (Signed)
OB/GYN Faculty Practice Delivery Note  [name] is a 32 y.o. [id number] s/p SVD at [redacted]w[redacted]d. She was admitted for preterm labor.   ROM: 16h 84m with clear fluid for both Twin A and Twin B GBS Status: Unknown, treated with Ampicillin   Maximum Maternal Temperature: 98.37F   Labor Progress: Initial SVE: 5/90/ballotable. She then progressed to complete.   Delivery Date/Time: 05/15/23   Twin A: 2123  Twin B: 2158 Delivery: Called to room and patient was complete and pushing. Twin A delivered ROA. No nuchal cord present. Shoulder and body delivered in usual fashion. Infant with spontaneous cry, placed on mother's abdomen, dried and stimulated. Cord clamped x 2 after 1-minute delay, and cut by father of baby. Clamp placed on cord for Twin A to identify cord later. Cord blood drawn.   Twin B noted to be cephalic on cervical check with compound left hand which was able to be reduced back. Patient then began pushing with Twin B. After 34 minutes of pushing, Twin B was delivered LOA. No nuchal cord present. Shoulder and body delivered in the usual fashion. Infant with spontaneous cry, placed on mother's abdomen, dried and stimulated. Cord clamped x 2 after 1-minute delay, and cut by father of baby. Cord blood drawn.   Placenta for Twin B delivered spontaneously with gentle cord traction at 2200. Placenta for Twin A then delivered spontaneously with gentle cord traction at 2202. Pitocin started, fundus still noted to be boggy and steady brisk bleeding noted so TXA and Methergine given. Fundus more firm with manual sweep with several moderate sized clots followed by bimanual massage. Fundus firm at umbilicus. Rectal Cytotec administered at this point and bleeding improved to a slight trickle. After a few minutes, steady trickle again noted. An straight catheterization was performed with 50cc clear urine drained. Another fundal sweep was performed with extraction of small clots. JADA placed and hooked  up to suction at this point. Fundus noted to be firm.   Labia, perineum, vagina, and cervix inspected inspected with a small hemostatic superficial laceration noted at the introitus around 7:00 that did not require repair.   Baby Weight: pending  Placenta: Sent to pathology Complications: Postpartum bleeding, requiring Methergine, Cytotec, TXA, and placement of JADA Lacerations: Hemostatic superficial laceration at vaginal introitus EBL: 450 mL Analgesia: Epidural   Infant:  APGAR (1 MIN):   Twin A: 8  Twin B: 8  APGAR (5 MIN):  Twin A: 9  Twin B: 9  Sundra Aland, MD OB Fellow, Faculty Practice [place], [facility] for [employer]

## 2022-08-11 ENCOUNTER — Emergency Department (HOSPITAL_COMMUNITY)
Admission: AD | Admit: 2022-08-11 | Discharge: 2022-08-11 | Disposition: A | Discharge status: Home / Self Care | Payer: 59 | Attending: Obstetrics & Gynecology | Admitting: Obstetrics & Gynecology

## 2022-08-11 ENCOUNTER — Encounter (HOSPITAL_COMMUNITY): Payer: Self-pay | Admitting: Obstetrics & Gynecology

## 2022-08-11 ENCOUNTER — Emergency Department (HOSPITAL_COMMUNITY): Payer: 59

## 2022-08-11 DIAGNOSIS — Z91040 Latex allergy status: Secondary | ICD-10-CM | POA: Insufficient documentation

## 2022-08-11 DIAGNOSIS — R109 Unspecified abdominal pain: Secondary | ICD-10-CM | POA: Diagnosis not present

## 2022-08-11 DIAGNOSIS — N858 Other specified noninflammatory disorders of uterus: Secondary | ICD-10-CM | POA: Diagnosis not present

## 2022-08-11 DIAGNOSIS — N938 Other specified abnormal uterine and vaginal bleeding: Secondary | ICD-10-CM

## 2022-08-11 DIAGNOSIS — R1909 Other intra-abdominal and pelvic swelling, mass and lump: Secondary | ICD-10-CM

## 2022-08-11 DIAGNOSIS — R1030 Lower abdominal pain, unspecified: Secondary | ICD-10-CM | POA: Diagnosis present

## 2022-08-11 LAB — COMPREHENSIVE METABOLIC PANEL
ALT: 11 U/L (ref 0–44)
AST: 21 U/L (ref 15–41)
Albumin: 4.1 g/dL (ref 3.5–5.0)
Alkaline Phosphatase: 56 U/L (ref 38–126)
Anion gap: 9 (ref 5–15)
BUN: 7 mg/dL (ref 6–20)
CO2: 21 mmol/L — ABNORMAL LOW (ref 22–32)
Calcium: 9.2 mg/dL (ref 8.9–10.3)
Chloride: 102 mmol/L (ref 98–111)
Creatinine, Ser: 0.67 mg/dL (ref 0.44–1.00)
GFR, Estimated: >60 mL/min (ref >60)
Glucose, Bld: 79 mg/dL (ref 70–99)
Potassium: 3.7 mmol/L (ref 3.5–5.1)
Sodium: 132 mmol/L — ABNORMAL LOW (ref 135–145)
Total Bilirubin: 0.6 mg/dL (ref 0.3–1.2)
Total Protein: 7.6 g/dL (ref 6.5–8.1)

## 2022-08-11 LAB — CBC WITH DIFFERENTIAL/PLATELET
Abs Immature Granulocytes: 0.03 10*3/uL (ref 0.00–0.07)
Basophils Absolute: 0 10*3/uL (ref 0.0–0.1)
Basophils Relative: 0 %
Eosinophils Absolute: 0.1 10*3/uL (ref 0.0–0.5)
Eosinophils Relative: 1 %
HCT: 34.1 % — ABNORMAL LOW (ref 36.0–46.0)
Hemoglobin: 11.8 g/dL — ABNORMAL LOW (ref 12.0–15.0)
Immature Granulocytes: 0 %
Lymphocytes Relative: 19 %
Lymphs Abs: 1.7 10*3/uL (ref 0.7–4.0)
MCH: 32.5 pg (ref 26.0–34.0)
MCHC: 34.6 g/dL (ref 30.0–36.0)
MCV: 93.9 fL (ref 80.0–100.0)
Monocytes Absolute: 0.5 10*3/uL (ref 0.1–1.0)
Monocytes Relative: 5 %
Neutro Abs: 6.6 10*3/uL (ref 1.7–7.7)
Neutrophils Relative %: 75 %
Platelets: 405 10*3/uL — ABNORMAL HIGH (ref 150–400)
RBC: 3.63 MIL/uL — ABNORMAL LOW (ref 3.87–5.11)
RDW: 11.9 % (ref 11.5–15.5)
WBC: 8.9 10*3/uL (ref 4.0–10.5)
nRBC: 0 % (ref 0.0–0.2)

## 2022-08-11 LAB — URINALYSIS, ROUTINE W REFLEX MICROSCOPIC
Bacteria, UA: NONE SEEN
Bilirubin Urine: NEGATIVE
Glucose, UA: NEGATIVE mg/dL
Ketones, ur: 5 mg/dL — AB
Leukocytes,Ua: NEGATIVE
Nitrite: NEGATIVE
Protein, ur: NEGATIVE mg/dL
Specific Gravity, Urine: 1.004 — ABNORMAL LOW (ref 1.005–1.030)
pH: 5 (ref 5.0–8.0)

## 2022-08-11 LAB — LIPASE, BLOOD: Lipase: 33 U/L (ref 11–51)

## 2022-08-11 LAB — WET PREP, GENITAL
Clue Cells Wet Prep HPF POC: NONE SEEN
Sperm: NONE SEEN
Trich, Wet Prep: NONE SEEN
WBC, Wet Prep HPF POC: >=10 — AB (ref <10)
Yeast Wet Prep HPF POC: NONE SEEN

## 2022-08-11 LAB — I-STAT BETA HCG BLOOD, ED (MC, WL, AP ONLY): I-stat hCG, quantitative: <5 m[IU]/mL (ref <5)

## 2022-08-11 MED ORDER — ONDANSETRON HCL 4 MG/2ML IJ SOLN
4.0000 mg | Freq: Once | INTRAMUSCULAR | Status: AC
  Administered 2022-08-11: 4 mg via INTRAVENOUS
  Filled 2022-08-11: qty 2

## 2022-08-11 MED ORDER — MORPHINE SULFATE (PF) 4 MG/ML IV SOLN
4.0000 mg | Freq: Once | INTRAVENOUS | Status: AC
  Administered 2022-08-11: 4 mg via INTRAVENOUS
  Filled 2022-08-11: qty 1

## 2022-08-11 MED ORDER — KETOROLAC TROMETHAMINE 15 MG/ML IJ SOLN
15.0000 mg | Freq: Once | INTRAMUSCULAR | Status: AC
  Administered 2022-08-11: 15 mg via INTRAVENOUS
  Filled 2022-08-11: qty 1

## 2022-08-11 MED ORDER — OXYCODONE-ACETAMINOPHEN 5-325 MG PO TABS: 1.0000 | ORAL_TABLET | Freq: Four times a day (QID) | ORAL | 0 refills | Status: DC | PRN

## 2022-08-11 MED ORDER — IBUPROFEN 600 MG PO TABS: 600.0000 mg | ORAL_TABLET | Freq: Four times a day (QID) | ORAL | 0 refills | Status: DC | PRN

## 2022-08-11 MED ORDER — DOXYCYCLINE HYCLATE 100 MG PO CAPS: 100.0000 mg | ORAL_CAPSULE | Freq: Two times a day (BID) | ORAL | 0 refills | Status: DC

## 2022-08-11 MED ORDER — LACTATED RINGERS IV BOLUS
1000.0000 mL | Freq: Once | INTRAVENOUS | Status: AC
  Administered 2022-08-11: 1000 mL via INTRAVENOUS

## 2022-08-11 MED ORDER — OXYCODONE-ACETAMINOPHEN 5-325 MG PO TABS
1.0000 | ORAL_TABLET | Freq: Once | ORAL | Status: AC
  Administered 2022-08-11: 1 via ORAL
  Filled 2022-08-11: qty 1

## 2022-08-11 MED ORDER — IOHEXOL 350 MG/ML SOLN
75.0000 mL | Freq: Once | INTRAVENOUS | Status: AC | PRN
  Administered 2022-08-11: 75 mL via INTRAVENOUS

## 2022-08-11 NOTE — ED Provider Notes (Signed)
[place] Provider Note   CSN: [id number] Arrival date & time: 08/11/22  0425     History  Chief Complaint  Patient presents with   Abdominal Pain   Vaginal Pain    [name] is a 32 y.o. female.  89 37-year-old female presents today for evaluation of lower abdominal pain/pelvic pain for about 1 week duration.  She reports associated vaginal bleeding.  Denies lightheadedness.  States in October she had a miscarriage.  She was seen at urgent care yesterday and was told to come to the emergency department for patient as this could be a complication related to the miscarriage.  She initially went to MAU where she was triaged and determined not to be a complication from the miscarriage.  She was transferred to the main emergency department for further evaluation and workup.  She denies dysuria, vaginal discharge, nausea, vomiting, flank pain.  Pain does not radiate anywhere else.  She is sexually active with 1 female partner.  Denies recent change in sexual partner.  Again no vaginal discharge.  The history is provided by the patient. No language interpreter was used.       Home Medications Prior to Admission medications   Medication Sig Start Date End Date Taking? Authorizing Provider  acetaminophen (TYLENOL) 500 MG tablet Take 1,000 mg by mouth every 6 (six) hours as needed for mild pain, moderate pain or headache.     [provider]  metroNIDAZOLE (FLAGYL) 500 MG tablet Take 1 tablet (500 mg total) by mouth 2 (two) times daily. 03/27/22   Wende Mott, CNM  misoprostol (CYTOTEC) 200 MCG tablet Place four tablets in the vagina once.  If not successful, can repeat in 24hrs 04/16/22   Janyth Pupa, DO  Prenat-FeAsp-Meth-FA-DHA w/o A (PRENATE PIXIE) 10-0.6-0.4-200 MG CAPS Take 1 capsule by mouth at bedtime.    [provider]      Allergies    Latex and Sulfa antibiotics    Review of Systems   Review of  Systems  Constitutional:  Negative for chills.  Respiratory:  Negative for shortness of breath.   Cardiovascular:  Negative for chest pain.  Gastrointestinal:  Positive for abdominal pain. Negative for nausea.  All other systems reviewed and are negative.   Physical Exam Updated Vital Signs BP 114/79   Pulse 74   Temp 98 F (36.7 C)   Resp 17   Ht 5' 2"$  (1.575 m)   Wt 69.4 kg   LMP 02/15/2022   SpO2 98%   Breastfeeding Unknown   BMI 27.98 kg/m  Physical Exam Vitals and nursing note reviewed.  Constitutional:      General: She is not in acute distress.    Appearance: Normal appearance. She is not ill-appearing.  HENT:     Head: Normocephalic and atraumatic.     Nose: Nose normal.  Eyes:     General: No scleral icterus.    Extraocular Movements: Extraocular movements intact.     Conjunctiva/sclera: Conjunctivae normal.  Cardiovascular:     Rate and Rhythm: Normal rate and regular rhythm.     Pulses: Normal pulses.  Pulmonary:     Effort: Pulmonary effort is normal. No respiratory distress.     Breath sounds: Normal breath sounds. No wheezing or rales.  Abdominal:     General: There is no distension.     Palpations: Abdomen is soft.     Tenderness: There is abdominal tenderness (lower abdomen bilaterally).  There is no guarding.  Musculoskeletal:        General: Normal range of motion.     Cervical back: Normal range of motion.  Skin:    General: Skin is warm and dry.  Neurological:     General: No focal deficit present.     Mental Status: She is alert and oriented to person, place, and time. Mental status is at baseline.     ED Results / Procedures / Treatments   Labs (all labs ordered are listed, but only abnormal results are displayed) Labs Reviewed  URINALYSIS, ROUTINE W REFLEX MICROSCOPIC - Abnormal; Notable for the following components:      Result Value   Color, Urine STRAW (*)    Specific Gravity, Urine 1.004 (*)    Hgb urine dipstick MODERATE (*)     Ketones, ur 5 (*)    All other components within normal limits    EKG None  Radiology No results found.  Procedures Procedures    Medications Ordered in ED Medications - No data to display  ED Course/ Medical Decision Making/ A&P Clinical Course as of 08/11/22 0638  Wed Aug 11, 2022  [id number] CBC without leukocytosis.  Hemoglobin at 11.8 which is around her baseline.  No other acute findings. HCG negative [AA]    Clinical Course User Index [AA] Evlyn Courier, PA-C                             Medical Decision Making Amount and/or Complexity of Data Reviewed Labs: ordered. Radiology: ordered.  Risk Prescription drug management.   Medical Decision Making / ED Course   This patient presents to the ED for concern of abdominal pain, this involves an extensive number of treatment options, and is a complaint that carries with it a high risk of complications and morbidity.  The differential diagnosis includes appendicitis, ovarian torsion, menstrual cramps, gastroenteritis,  MDM: 32 year old female presents today for evaluation of above-mentioned complaints.  She is without acute distress.  Mild tenderness to palpation across the lower abdomen.  UA which was obtained in the MAU shows moderate amount of hemoglobin otherwise no evidence of UTI.  Will obtain blood work, CT abdomen pelvis with contrast, as well as pelvic ultrasound to rule out torsion.  CT is to rule out acute intra-abdominal infection.  Will provide IV fluids, pain medication for comfort.  Patient at the end of my shift awaiting labs and imaging.  Signed out to Jaquita Folds to follow-up on labs and imaging and reevaluate patient.  Disposition pending workup.  Lab Tests: -I ordered, reviewed, and interpreted labs.   The pertinent results include:   Labs Reviewed  URINALYSIS, ROUTINE W REFLEX MICROSCOPIC - Abnormal; Notable for the following components:      Result Value   Color, Urine STRAW (*)    Specific Gravity,  Urine 1.004 (*)    Hgb urine dipstick MODERATE (*)    Ketones, ur 5 (*)    All other components within normal limits  CBC WITH DIFFERENTIAL/PLATELET  COMPREHENSIVE METABOLIC PANEL  LIPASE, BLOOD  I-STAT BETA HCG BLOOD, ED ([facility], [facility], [facility] ONLY)      EKG  EKG Interpretation  Date/Time:    Ventricular Rate:    PR Interval:    QRS Duration:   QT Interval:    QTC Calculation:   R Axis:     Text Interpretation:  Imaging Studies ordered: I ordered imaging studies including pelvic US and ct abdomen pelvis ordered but not resulted at the end of my shift. I independently visualized and interpreted imaging. I agree with the radiologist interpretation   Medicines ordered and prescription drug management: Meds ordered this encounter  Medications   lactated ringers bolus 1,000 mL   ondansetron (ZOFRAN) injection 4 mg   morphine (PF) 4 MG/ML injection 4 mg    -I have reviewed the patients home medicines and have made adjustments as needed  Co morbidities that complicate the patient evaluation  Past Medical History:  Diagnosis Date   BV (bacterial vaginosis)    Trichomonas       Dispostion: Patient signed out to oncoming provider.  Final Clinical Impression(s) / ED Diagnoses Final diagnoses:  Lower abdominal pain    Rx / DC Orders ED Discharge Orders     None         Evlyn Courier, PA-C 08/11/22 [id number]    Merryl Hacker, MD 08/11/22 [phone]

## 2022-08-11 NOTE — MAU Note (Addendum)
.  [name] is a 32 y.o. here in MAU reporting SAB in Oct. 2023. Periods have been irreg with mostly spotting. Last Weds when completed workout started having more vag bleeding and blood clots until yesterday. Yesterday started having worse abdominal. Feels like labor pains with a lot of pelvic pressure. Pt was seen at [facility] this morning and sent to MAU for vag u/s. Bleeding is minimal now but is being seen for the severe abd pain. States the shot they gave her at [place] did not help LMP: 08/04/22 Onset of complaint: 7days Pain score: 10 Vitals:   08/11/22 0443 08/11/22 0446  BP:  118/77  Pulse: 87   Resp: 17   Temp: 98.1 F (36.7 C)   SpO2: 100%      FHT:n/a Lab orders placed from triage:  u/a

## 2022-08-11 NOTE — Discharge Instructions (Addendum)
Please take antibiotic as prescribed for the full duration.  Take ibuprofen as needed for pain.  If your pain is severe you may take Percocet but be aware that it can cause drowsiness.  Call and follow-up closely with Dr. Elgie Congo in 1 week for outpatient evaluation.  Return if you have any concern.

## 2022-08-11 NOTE — MAU Note (Signed)
Hansel Feinstein CNM in Family Rm to see pt and discuss plan of care. Pt will be transferred to main ED for further eval of lower abd pain. Pt transferred by w/c by NT. Claiborne Billings RN CN in [place] ED given report. Dr Dina Rich accepting physician

## 2022-08-11 NOTE — Consult Note (Signed)
OB/GYN Consult Note  Referring Provider: Domenic Moras, PA  [facility] [name] is a 32 y.o. [phone] presenting for severe abdominal pain and vaginal bleeding. Gyn consulted for obvious mass/tissue in the cervix.   Pt was initially seen in urgent care after increased vaginal bleeding with passage of clots for the last two days.  The clots and bleeding decreased, but pt noted increased vaginal pressure and discomfort. While being evaluated in the ER, the PA noted a mass in the cervix that he was unable to remove.  During her interrogation the pt denied a history of uterine fibroid.  Older scans also did not show any fibroids.  Pt did note she had a miscarriage treated with misoprostol in September.  She admitted she did not complete follow up for this condition.      Past Medical History:  Diagnosis Date   BV (bacterial vaginosis)    Trichomonas     Past Surgical History:  Procedure Laterality Date   DILATION AND CURETTAGE OF UTERUS      OB History  Gravida Para Term Preterm AB Living  6 2 2 $ 0 2 2  SAB IAB Ectopic Multiple Live Births  1 0 0 0 2    # Outcome Date GA Lbr Len/2nd Weight Sex Delivery Anes PTL Lv  6 Gravida           5 Term 09/05/16 35w3d10:07 / 00:24 3055 g M Vag-Spont EPI  LIV  4 Term 12/06/13 3100w2d0:43 / 00:44 3181 g M Vag-Spont Local, EPI  LIV  3 AB 06/28/09 128w0d      2 Gravida           1 SAB             Social History   Socioeconomic History   Marital status: Single    Spouse name: Not on file   Number of children: Not on file   Years of education: Not on file   Highest education level: Not on file  Occupational History   Not on file  Tobacco Use   Smoking status: Former    Types: Cigarettes    Quit date: 04/02/2013    Years since quitting: 9.3   Smokeless tobacco: Former  [employer] laboratory techniciane: Never used  Substance and Sexual Activity   Alcohol use: No    Alcohol/week: 0.0 standard drinks of alcohol    Comment: occasionally before  pregnancy   Drug use: No   Sexual activity: Not Currently    Partners: Male    Birth control/protection: None  Other Topics Concern   Not on file  Social History Narrative   Not on file   Social Determinants of Health   Financial Resource Strain: Not on file  Food Insecurity: Not on file  Transportation Needs: Not on file  Physical Activity: Not on file  Stress: Not on file  Social Connections: Not on file    Family History  Problem Relation Age of Onset   Cancer Brother    Hypertension Maternal Grandfather    Diabetes Brother    Heart disease Neg Hx     (Not in a hospital admission)   Allergies  Allergen Reactions   Latex Rash   Sulfa Antibiotics Hives    Review of Systems: Negative except for what is mentioned in HPI.     Physical Exam: BP 129/85   Pulse 62   Temp 98 F (36.7 C)   Resp 16  Ht 5' 2"$  (1.575 m)   Wt 69.4 kg   LMP 02/15/2022   SpO2 100%   Breastfeeding Unknown   BMI 27.98 kg/m  CONSTITUTIONAL: Well-developed, well-nourished female in no acute distress.  HENT:  Normocephalic, atraumatic, External right and left ear normal. Oropharynx is clear and moist EYES: Conjunctivae and EOM are normal.  NECK: Normal range of motion, supple, no masses SKIN: Skin is warm and dry. No rash noted. Not diaphoretic. No erythema. No pallor. [place]: Alert and oriented to person, place, and time. Normal reflexes, muscle tone coordination. No cranial nerve deficit noted. PSYCHIATRIC: Normal mood and affect. Normal behavior. Normal judgment and thought content. CARDIOVASCULAR: Normal heart rate noted, regular rhythm RESPIRATORY: Effort and breath sounds normal, no problems with respiration noted ABDOMEN: Soft, mildly tender, nondistended. PELVIC: SSE: normal vagina.  Cervix dilated 1-2 cm with obvious mass just behind the cervical os, mass orange- tan in color.  See separate paragraph regarding removal MUSCULOSKELETAL: Normal range of motion. No edema and no  tenderness. 2+ distal pulses.  Pelvic exam done with  chaperone present.   Procedure:  Consent regarding bedside removal of mass obtained. Pt given risks and benefits of procedure including bleeding/infection.    Cervix was cleaned x 2 with betadine after speculum was placed.  Sterile ring forcep was used to grasp the mass and place it on gentle traction.  The mass was twisted in a clockwise fashion until the mass was removed in its entirety.  There was minimal bleeding after removal of the mass.  The mass was sent for pathology.  Spoke with the PA and advised 5-7 days of po doxycycline, po ibuprofen and po oxycodone. Message sent to help arrange follow up at [facility] in 2-3 weeks.  Pertinent Labs/Studies:   Results for orders placed or performed during the hospital encounter of 08/11/22 (from the past 72 hour(s))  Urinalysis, Routine w reflex microscopic -Urine, Clean Catch     Status: Abnormal   Collection Time: 08/11/22  5:00 AM  Result Value Ref Range   Color, Urine STRAW (A) YELLOW   APPearance CLEAR CLEAR   Specific Gravity, Urine 1.004 (L) 1.005 - 1.030   pH 5.0 5.0 - 8.0   Glucose, UA NEGATIVE NEGATIVE mg/dL   Hgb urine dipstick MODERATE (A) NEGATIVE   Bilirubin Urine NEGATIVE NEGATIVE   Ketones, ur 5 (A) NEGATIVE mg/dL   Protein, ur NEGATIVE NEGATIVE mg/dL   Nitrite NEGATIVE NEGATIVE   Leukocytes,Ua NEGATIVE NEGATIVE   RBC / HPF 0-5 0 - 5 RBC/hpf   WBC, UA 0-5 0 - 5 WBC/hpf   Bacteria, UA NONE SEEN NONE SEEN   Squamous Epithelial / HPF 0-5 0 - 5 /HPF    Comment: Performed at [facility], [address] [place], [place] [address]  CBC with Differential     Status: Abnormal   Collection Time: 08/11/22  5:52 AM  Result Value Ref Range   WBC 8.9 4.0 - 10.5 K/uL   RBC 3.63 (L) 3.87 - 5.11 MIL/uL   Hemoglobin 11.8 (L) 12.0 - 15.0 g/dL   HCT 34.1 (L) 36.0 - 46.0 %   MCV 93.9 80.0 - 100.0 fL   MCH 32.5 26.0 - 34.0 pg   MCHC 34.6 30.0 - 36.0 g/dL   RDW  11.9 11.5 - 15.5 %   Platelets 405 (H) 150 - 400 K/uL   nRBC 0.0 0.0 - 0.2 %   Neutrophils Relative % 75 %   Neutro Abs  6.6 1.7 - 7.7 K/uL   Lymphocytes Relative 19 %   Lymphs Abs 1.7 0.7 - 4.0 K/uL   Monocytes Relative 5 %   Monocytes Absolute 0.5 0.1 - 1.0 K/uL   Eosinophils Relative 1 %   Eosinophils Absolute 0.1 0.0 - 0.5 K/uL   Basophils Relative 0 %   Basophils Absolute 0.0 0.0 - 0.1 K/uL   Immature Granulocytes 0 %   Abs Immature Granulocytes 0.03 0.00 - 0.07 K/uL    Comment: Performed at [facility] [address] [place], [place] [address]  Comprehensive metabolic panel     Status: Abnormal   Collection Time: 08/11/22  5:52 AM  Result Value Ref Range   Sodium 132 (L) 135 - 145 mmol/L   Potassium 3.7 3.5 - 5.1 mmol/L   Chloride 102 98 - 111 mmol/L   CO2 21 (L) 22 - 32 mmol/L   Glucose, Bld 79 70 - 99 mg/dL    Comment: Glucose reference range applies only to samples taken after fasting for at least 8 hours.   BUN 7 6 - 20 mg/dL   Creatinine, Ser 0.67 0.44 - 1.00 mg/dL   Calcium 9.2 8.9 - 10.3 mg/dL   Total Protein 7.6 6.5 - 8.1 g/dL   Albumin 4.1 3.5 - 5.0 g/dL   AST 21 15 - 41 U/L   ALT 11 0 - 44 U/L   Alkaline Phosphatase 56 38 - 126 U/L   Total Bilirubin 0.6 0.3 - 1.2 mg/dL   GFR, Estimated >60 >60 mL/min    Comment: (NOTE) Calculated using the CKD-EPI Creatinine Equation (2021)    Anion gap 9 5 - 15    Comment: Performed at [facility] [address] [place], [place] [address]  Lipase, blood     Status: None   Collection Time: 08/11/22  5:52 AM  Result Value Ref Range   Lipase 33 11 - 51 U/L    Comment: Performed at [facility] [address] [place], [place] [address]  I-Stat Beta hCG blood, ED ([facility], [facility], [facility] only)     Status: None   Collection Time: 08/11/22  6:15 AM  Result Value Ref Range   I-stat hCG, quantitative <5.0 <5 mIU/mL   Comment 3            Comment:   GEST. AGE      CONC.  (mIU/mL)   <=1 WEEK        5 - 50     2  WEEKS       50 - 500     3 WEEKS       100 - 10,000     4 WEEKS     1,000 - 30,000        FEMALE AND NON-PREGNANT FEMALE:     LESS THAN 5 mIU/mL   Wet prep, genital     Status: Abnormal   Collection Time: 08/11/22  8:30 AM   Specimen: PATH Cytology Cervicovaginal Ancillary Only  Result Value Ref Range   Yeast Wet Prep HPF POC NONE SEEN NONE SEEN   Trich, Wet Prep NONE SEEN NONE SEEN   Clue Cells Wet Prep HPF POC NONE SEEN NONE SEEN   WBC, Wet Prep HPF POC >=10 (A) <10   Sperm NONE SEEN     Comment: Performed at [facility], [place] [address], [place], [place] [address]       Assessment and Plan :[facility]  [name] is a 32 y.o. [id number] admitted for abdominal pain and uterine mass.   Pt can be discharged with follow up in 2-3 weeks.  Pt advised to return to MAU or call office if there is increased pain or bleeding   Thank you for this consult, we will sign off, please call or re-consult with further questions.   For OB/GYN questions, please call the Center for [place] [facility] [place] Monday - Friday, 8 am - 5 pm: [phone] All other times: [phone]    Lynnda Shields, M.D. Attending [place], [facility] for [employer], [place]

## 2022-08-11 NOTE — ED Notes (Signed)
Pt states she was sent to MAU from an [facility] and then sent here. She c/o lower abdominal pain and cramping, and vaginal cramping "like I'm in labor." Pt also endorses irregular menstrual cycles since having a miscarriage in October 2023.

## 2022-08-11 NOTE — ED Provider Notes (Signed)
Received signout from previous provider, please see his note for complete H&P.  This is a 32 year old female presenting with lower abdominal pain ongoing for the past 1 week.  She also endorsed some vaginal bleeding as well.  She had a miscarriage in October of last year.  She was seen in urgent care yesterday for her complaint and received Toradol.  She went to [facility]U earlier this morning for for her symptoms but was felt to be non-OBGYN related and was recommended to come to the ER for evaluation.  On exam, patient is laying in bed appears to be in no acute discomfort.  She does have some bowel suprapubic tenderness on palpation but no guarding or rebound tenderness.  Labs and imaging obtained independently viewed interpreted by me and I agree with radiology interpretation.  Labs overall reassuring.  CT scan and ultrasound shows a 3 x 5 cm avascular mass distending the low uterine segment and cervical canal which may be a blood clot.  Discussed this finding with patient, patient endorsed she still having some lower abdominal cramping and request for a pelvic exam.  Her last sexual activity was a month ago.  She does not endorse any vaginal discharge.  Suspicion for STI is low.  Pelvic exam  Date/Time: 08/11/2022 8:34 AM  Performed by: Domenic Moras, PA-C Authorized by: Domenic Moras, PA-C  Comments: [place], RN, was available to chaperone.  No inguinal lymphadenopathy or inguinal hernia noted.  Normal external exam.  Mild discomfort with speculum insertion.  Normal vaginal vault however at the cervical os opening there is a masslike structure occluded at the opening.  Bimanual exam was not performed.  Attempted to remove mass with alligator forcep without success.    9:06 AM Appreciate consultation from on-call OB/GYN, Dr. Elgie Congo, who evaluated patient and will attempt to remove the mass as appropriate.    10:10 AM OB/GYN Dr. Elgie Congo has seen evaluate patient.  He was able to successfully remove the mass.   He will send it for cytology and pathology.  He request patient to be discharged home with doxycycline, ibuprofen, and Percocet with outpatient follow-up in his office in 1 week.  Work note provided.  Patient tolerated procedures well and is stable to be discharged home.        Results for orders placed or performed during the hospital encounter of 08/11/22  Urinalysis, Routine w reflex microscopic -Urine, Clean Catch  Result Value Ref Range   Color, Urine STRAW (A) YELLOW   APPearance CLEAR CLEAR   Specific Gravity, Urine 1.004 (L) 1.005 - 1.030   pH 5.0 5.0 - 8.0   Glucose, UA NEGATIVE NEGATIVE mg/dL   Hgb urine dipstick MODERATE (A) NEGATIVE   Bilirubin Urine NEGATIVE NEGATIVE   Ketones, ur 5 (A) NEGATIVE mg/dL   Protein, ur NEGATIVE NEGATIVE mg/dL   Nitrite NEGATIVE NEGATIVE   Leukocytes,Ua NEGATIVE NEGATIVE   RBC / HPF 0-5 0 - 5 RBC/hpf   WBC, UA 0-5 0 - 5 WBC/hpf   Bacteria, UA NONE SEEN NONE SEEN   Squamous Epithelial / HPF 0-5 0 - 5 /HPF  CBC with Differential  Result Value Ref Range   WBC 8.9 4.0 - 10.5 K/uL   RBC 3.63 (L) 3.87 - 5.11 MIL/uL   Hemoglobin 11.8 (L) 12.0 - 15.0 g/dL   HCT 34.1 (L) 36.0 - 46.0 %   MCV 93.9 80.0 - 100.0 fL   MCH 32.5 26.0 - 34.0 pg   MCHC 34.6 30.0 - 36.0 g/dL  RDW 11.9 11.5 - 15.5 %   Platelets 405 (H) 150 - 400 K/uL   nRBC 0.0 0.0 - 0.2 %   Neutrophils Relative % 75 %   Neutro Abs 6.6 1.7 - 7.7 K/uL   Lymphocytes Relative 19 %   Lymphs Abs 1.7 0.7 - 4.0 K/uL   Monocytes Relative 5 %   Monocytes Absolute 0.5 0.1 - 1.0 K/uL   Eosinophils Relative 1 %   Eosinophils Absolute 0.1 0.0 - 0.5 K/uL   Basophils Relative 0 %   Basophils Absolute 0.0 0.0 - 0.1 K/uL   Immature Granulocytes 0 %   Abs Immature Granulocytes 0.03 0.00 - 0.07 K/uL  Comprehensive metabolic panel  Result Value Ref Range   Sodium 132 (L) 135 - 145 mmol/L   Potassium 3.7 3.5 - 5.1 mmol/L   Chloride 102 98 - 111 mmol/L   CO2 21 (L) 22 - 32 mmol/L   Glucose,  Bld 79 70 - 99 mg/dL   BUN 7 6 - 20 mg/dL   Creatinine, Ser 0.67 0.44 - 1.00 mg/dL   Calcium 9.2 8.9 - 10.3 mg/dL   Total Protein 7.6 6.5 - 8.1 g/dL   Albumin 4.1 3.5 - 5.0 g/dL   AST 21 15 - 41 U/L   ALT 11 0 - 44 U/L   Alkaline Phosphatase 56 38 - 126 U/L   Total Bilirubin 0.6 0.3 - 1.2 mg/dL   GFR, Estimated >60 >60 mL/min   Anion gap 9 5 - 15  Lipase, blood  Result Value Ref Range   Lipase 33 11 - 51 U/L  I-Stat Beta hCG blood, ED ([facility], [facility], [facility] only)  Result Value Ref Range   I-stat hCG, quantitative <5.0 <5 mIU/mL   Comment 3           CT ABDOMEN PELVIS W CONTRAST  Result Date: 08/11/2022 CLINICAL DATA:  Abdominal pain, acute nonlocalized. EXAM: CT ABDOMEN AND PELVIS WITH CONTRAST TECHNIQUE: Multidetector CT imaging of the abdomen and pelvis was performed using the standard protocol following bolus administration of intravenous contrast. RADIATION DOSE REDUCTION: This exam was performed according to the departmental dose-optimization program which includes automated exposure control, adjustment of the mA and/or kV according to patient size and/or use of iterative reconstruction technique. CONTRAST:  43m OMNIPAQUE IOHEXOL 350 MG/ML SOLN COMPARISON:  Same day pelvic ultrasound. FINDINGS: Lower chest: No acute abnormality. Hepatobiliary: No suspicious hepatic lesion. Gallbladder is unremarkable. No biliary ductal dilation Pancreas: No pancreatic ductal dilation or evidence of acute inflammation. Spleen: No splenomegaly or focal splenic lesion. Adrenals/Urinary Tract: Bilateral adrenal glands appear normal. No hydronephrosis. Kidneys demonstrate symmetric enhancement. Urinary bladder is unremarkable for degree of distension. Stomach/Bowel: Wall thickening versus underdistention of the gastric antrum. No pathologic dilation of small or large bowel. Normal appendix and terminal ileum. No evidence of acute bowel inflammation. Vascular/Lymphatic: Normal caliber abdominal aorta. Smooth IVC contours.  No pathologically enlarged abdominal or pelvic lymph nodes Reproductive: Heterogeneous material in the endometrial and endocervical canal centered in the lower uterine segment/cervix measuring up to 3.1 cm in diameter better characterized on same day pelvic ultrasound. No suspicious adnexal mass. Other: Trace pelvic free fluid is within physiologic normal limits. 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 IMPRESSION: 1. Heterogeneous material in the endometrial and endocervical canal centered in the lower uterine segment/cervix measuring up to 3.1 cm in thickness, better characterized on same day pelvic ultrasound. 2. Wall thickening versus underdistention of the gastric antrum.  Correlate for gastritis. Electronically Signed   By: Dahlia Bailiff M.D.   On: 08/11/2022 07:47   US PELVIC COMPLETE W TRANSVAGINAL AND TORSION R/O  Result Date: 08/11/2022 CLINICAL DATA:  Pelvic pain.  Negative urine pregnancy test EXAM: TRANSABDOMINAL AND TRANSVAGINAL ULTRASOUND OF PELVIS DOPPLER ULTRASOUND OF OVARIES TECHNIQUE: Both transabdominal and transvaginal ultrasound examinations of the pelvis were performed. Transabdominal technique was performed for global imaging of the pelvis including uterus, ovaries, adnexal regions, and pelvic cul-de-sac. It was necessary to proceed with endovaginal exam following the transabdominal exam to visualize the endometrium. Color and duplex Doppler ultrasound was utilized to evaluate blood flow to the ovaries. COMPARISON:  04/14/2022 FINDINGS: Uterus 8 x 8 x 5 cm = 170 cc volume.  Normal appearance of the myometrium Endometrium Distended lower uterine segment and cervical canal by a heterogeneous avascular mass measuring up to 3 x 5 x 3 cm. Given the negative urine pregnancy test and absence of a mass on prior ultrasound, question passing blood clot. Right ovary Measurements: 26 x 12 x 17 mm = volume: 3 mL. Normal appearance/no adnexal mass. Left  ovary Measurements: 21 x 13 x 16 mm = volume: Is 2.4 mL. Normal appearance/no adnexal mass. Pulsed Doppler evaluation of both ovaries demonstrates normal low-resistance arterial and venous waveforms. Other findings No abnormal free fluid. IMPRESSION: 1. 3 x 5 cm avascular mass distending the lower uterine segment and cervical canal. This may be blood clot given the avascular appearance, possibly visible on exam. No pre-existing endometrial or myometrial mass on pelvic ultrasound 04/14/2022 and negative urine pregnancy test, recommend follow-up given indeterminate appearance. 2. Normal appearance of the ovaries. Electronically Signed   By: Jorje Guild M.D.   On: 08/11/2022 06:51       Domenic Moras, PA-C 08/11/22 1023    Tretha Sciara, MD 08/12/22 [id number]

## 2022-08-11 NOTE — ED Notes (Signed)
ED Provider at bedside. 

## 2022-08-11 NOTE — MAU Provider Note (Signed)
None     S Ms. Cianna Chauncey Cruel Tucek is a 32 y.o. [phone] patient who presents to MAU today with complaint of severe abdominal pain which got worse tonight.  Was seen in [facility] yesterday morning but pain has gotten worse.  They gave her Toradol and did not do any imaging.   Had her first period with clots last Wednesday but bleeding stopped.  Had a 7 wk SAB in October.  Took Cytotec for resolution of that, no complications..   RN Note Teyana TYRAN NIX is a 32 y.o. at 97w2dhere in MAU reporting SAB in Oct. 2023. Periods have been irreg with mostly spotting. Last Weds when completed workout started having more vag bleeding and blood clots until yesterday. Yesterday started having worse abdominal. Feels like labor pains with a lot of pelvic pressure. [facility]. Bleeding is minimal now but is being seen for the severe abd pain. [place]: 08/04/22 Onset of complaint: 7days Pain score: 10  O BP 118/77   Pulse 87   Temp 98.1 F (36.7 C)   Resp 17   Ht 5' 2"$  (1.575 m)   Wt 69.4 kg   LMP 02/15/2022   SpO2 100%   Breastfeeding Unknown   BMI 27.98 kg/m  Physical Exam Constitutional:      General: She is not in acute distress (but very painful looking).    Appearance: She is ill-appearing (holding abdomen, rocking). She is not toxic-appearing.  HENT:     Head: Normocephalic.  Cardiovascular:     Rate and Rhythm: Normal rate.  Pulmonary:     Effort: Pulmonary effort is normal.  Abdominal:     Tenderness: There is abdominal tenderness.  Skin:    General: Skin is warm and dry.  Neurological:     General: No focal deficit present.     Mental Status: She is alert.  Psychiatric:        Mood and Affect: Mood normal.     A Medical screening exam complete Severe abdominal pain  P Discharge from MAU in stable condition [facility] the [facility] or seek care in outpatient facility of choice  Dr HDina Richaccepts transfer to ED for higher level care  WSeabron Spates CNM 08/11/2022 5:12 AM

## 2022-08-11 NOTE — ED Notes (Signed)
Pt to US.

## 2022-08-11 NOTE — ED Notes (Signed)
Pt back to room from [place], awaiting creatinine level and CT

## 2022-08-12 LAB — GC/CHLAMYDIA PROBE AMP (~~LOC~~) NOT AT ARMC
Chlamydia: NEGATIVE
Comment: NEGATIVE
Comment: NORMAL
Neisseria Gonorrhea: NEGATIVE

## 2022-08-12 LAB — SURGICAL PATHOLOGY

## 2022-08-13 ENCOUNTER — Telehealth: Payer: Self-pay | Admitting: *Deleted

## 2022-08-13 NOTE — Transitions of Care (Post Inpatient/ED Visit) (Signed)
   08/13/2022  Name: [name] MRN: [id number] DOB: 27-Oct-1990  Today's TOC FU Call Status: Today's TOC FU Call Status:: Unsuccessul Call (1st Attempt) Unsuccessful Call (1st Attempt) Date: 08/13/22  Attempted to reach the patient regarding the most recent Inpatient/ED visit.  Follow Up Plan: Additional outreach attempts will be made to reach the patient to complete the Transitions of Care (Post Inpatient/ED visit) call.   Lurena Joiner RN, BSN [place]  [employer] manager

## 2022-08-30 ENCOUNTER — Ambulatory Visit: Payer: 59 | Admitting: Obstetrics and Gynecology

## 2022-09-15 ENCOUNTER — Ambulatory Visit (INDEPENDENT_AMBULATORY_CARE_PROVIDER_SITE_OTHER): Payer: 59 | Admitting: Obstetrics and Gynecology

## 2022-09-15 ENCOUNTER — Other Ambulatory Visit (HOSPITAL_COMMUNITY)
Admission: RE | Admit: 2022-09-15 | Discharge: 2022-09-15 | Disposition: A | Discharge status: Home / Self Care | Payer: 59 | Source: Ambulatory Visit | Attending: Obstetrics and Gynecology | Admitting: Obstetrics and Gynecology

## 2022-09-15 VITALS — BP 116/80 | HR 83 | Ht 62.0 in | Wt 154.0 lb

## 2022-09-15 DIAGNOSIS — N858 Other specified noninflammatory disorders of uterus: Secondary | ICD-10-CM

## 2022-09-15 DIAGNOSIS — Z113 Encounter for screening for infections with a predominantly sexual mode of transmission: Secondary | ICD-10-CM | POA: Diagnosis present

## 2022-09-15 DIAGNOSIS — Z01419 Encounter for gynecological examination (general) (routine) without abnormal findings: Secondary | ICD-10-CM | POA: Diagnosis present

## 2022-09-15 NOTE — Progress Notes (Signed)
Follow up ED, removal of POC from SAB. Has had regular menstrual period x 1 since then and describes as normal. Pain only returns with prolonged standing or working.

## 2022-09-15 NOTE — Progress Notes (Signed)
GYNECOLOGY ANNUAL PREVENTATIVE CARE ENCOUNTER NOTE  History:     Stefan Blehm Jernberg is a 32 y.o. [phone] female here for a routine annual gynecologic exam.  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   Denies abnormal vaginal bleeding, discharge, pelvic pain, problems with intercourse or other gynecologic concerns.   Since removal of the mass the patient has had a normal menstrual cycle from 3/12-3/17.   Gynecologic History Patient's last menstrual period was 09/07/2022. Contraception: condoms Last Pap: 2020. Results were: normal with trich  Obstetric History OB History  Gravida Para Term Preterm AB Living  6 2 2  0 2 2  SAB IAB Ectopic Multiple Live Births  1 0 0 0 2    # Outcome Date GA Lbr Len/2nd Weight Sex Delivery Anes PTL Lv  6 Gravida           5 Term 09/05/16 [redacted]w[redacted]d 10:07 / 00:24 6 lb 11.8 oz (3.055 kg) M Vag-Spont EPI  LIV  4 Term 12/06/13 [redacted]w[redacted]d 10:43 / 00:44 7 lb 0.2 oz (3.181 kg) M Vag-Spont Local, EPI  LIV  3 AB 06/28/09 [redacted]w[redacted]d         2 Gravida           1 SAB             Past Medical History:  Diagnosis Date   BV (bacterial vaginosis)    Trichomonas     Past Surgical History:  Procedure Laterality Date   DILATION AND CURETTAGE OF UTERUS      Current Outpatient Medications on File Prior to Visit  Medication Sig Dispense Refill   acetaminophen (TYLENOL) 500 MG tablet Take 1,000 mg by mouth every 6 (six) hours as needed for mild pain, moderate pain or headache.      ibuprofen (ADVIL) 600 MG tablet Take 1 tablet (600 mg total) by mouth every 6 (six) hours as needed. 30 tablet 0   oxyCODONE-acetaminophen (PERCOCET) 5-325 MG tablet Take 1 tablet by mouth every 6 (six) hours as needed for moderate pain or severe pain. 10 tablet 0   doxycycline (VIBRAMYCIN) 100 MG capsule Take 1 capsule (100 mg total) by mouth 2 (two) times daily. 20 capsule 0   metroNIDAZOLE (FLAGYL) 500 MG tablet Take 1  tablet (500 mg total) by mouth 2 (two) times daily. 14 tablet 0   misoprostol (CYTOTEC) 200 MCG tablet Place four tablets in the vagina once.  If not successful, can repeat in 24hrs 4 tablet 0   Prenat-FeAsp-Meth-FA-DHA w/o A (PRENATE PIXIE) 10-0.6-0.4-200 MG CAPS Take 1 capsule by mouth at bedtime.     No current facility-administered medications on file prior to visit.    Allergies  Allergen Reactions   Latex Rash   Sulfa Antibiotics Hives    Social History:  reports that she quit smoking about 9 years ago. Her smoking use included cigarettes. She has quit using smokeless tobacco. She reports that she does not drink alcohol and does not use drugs.  Family History  Problem Relation Age of Onset   Cancer Brother    Hypertension Maternal Grandfather    Diabetes Brother    Heart disease Neg Hx     The following portions of the patient's history were reviewed and updated as appropriate: allergies, current medications, past family history, past medical history, past social history, past surgical history and problem list.  Review of Systems Pertinent items noted in HPI and  remainder of comprehensive ROS otherwise negative.  Physical Exam:  BP 116/80   Pulse 83   Ht 5\' 2"  (1.575 m)   Wt 154 lb (69.9 kg)   LMP 09/07/2022 Comment: 3/12-/17/24  BMI 28.17 kg/m  CONSTITUTIONAL: Well-developed, well-nourished female in no acute distress.  HENT:  Normocephalic, atraumatic, External right and left ear normal. Oropharynx is clear and moist EYES: Conjunctivae and EOM are normal. NECK: Normal range of motion, supple, no masses.  Normal thyroid.  SKIN: Skin is warm and dry. No rash noted. Not diaphoretic. No erythema. No pallor. MUSCULOSKELETAL: Normal range of motion. No tenderness.  No cyanosis, clubbing, or edema.  2+ distal pulses. NEUROLOGIC: Alert and oriented to person, place, and time. Normal reflexes, muscle tone coordination.  PSYCHIATRIC: Normal mood and affect. Normal behavior.  Normal judgment and thought content. CARDIOVASCULAR: Normal heart rate noted, regular rhythm RESPIRATORY: Clear to auscultation bilaterally. Effort and breath sounds normal, no problems with respiration noted. BREASTS: deferred ABDOMEN: Soft, no distention noted.  No tenderness, rebound or guarding.  PELVIC: Normal appearing external genitalia and urethral meatus; normal appearing vaginal mucosa and cervix.   Cervix still slightly dilated. No abnormal discharge noted.  Pap smear obtained.  Normal uterine size, no other palpable masses, no uterine or adnexal tenderness.  Performed in the presence of a chaperone.   Assessment and Plan:    1. Women's annual routine gynecological examination Normal annual exam - Cytology - PAP  2. Routine screening for STI (sexually transmitted infection) Per pt request - Cervicovaginal ancillary only - Hepatitis B surface antigen - Hepatitis C antibody - HIV Antibody (routine testing w rflx) - RPR  3. 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 No return of pain or symptoms.  Pt desires pelvic ultrasound to make sure there are no further fragments still within the uterus. - US PELVIC COMPLETE WITH TRANSVAGINAL; Future  Will follow up results of pap smear and manage accordingly. Routine preventative health maintenance measures emphasized. Please refer to After Visit Summary for other counseling recommendations.     F/u in 6 weeks with virtual visit to discuss ultrasound Lynnda Shields, MD, [place], [facility] [place]

## 2022-09-16 LAB — CERVICOVAGINAL ANCILLARY ONLY
Bacterial Vaginitis (gardnerella): NEGATIVE
Candida Glabrata: NEGATIVE
Candida Vaginitis: NEGATIVE
Chlamydia: NEGATIVE
Comment: NEGATIVE
Comment: NEGATIVE
Comment: NEGATIVE
Comment: NEGATIVE
Comment: NEGATIVE
Comment: NORMAL
Neisseria Gonorrhea: NEGATIVE
Trichomonas: NEGATIVE

## 2022-09-17 LAB — HEPATITIS C ANTIBODY: Hep C Virus Ab: NONREACTIVE

## 2022-09-17 LAB — HIV ANTIBODY (ROUTINE TESTING W REFLEX): HIV Screen 4th Generation wRfx: NONREACTIVE

## 2022-09-17 LAB — RPR: RPR Ser Ql: NONREACTIVE

## 2022-09-17 LAB — HEPATITIS B SURFACE ANTIGEN: Hepatitis B Surface Ag: NEGATIVE

## 2022-09-20 LAB — CYTOLOGY - PAP
Chlamydia: NEGATIVE
Comment: NEGATIVE
Comment: NEGATIVE
Comment: NORMAL
Diagnosis: NEGATIVE
High risk HPV: NEGATIVE
Neisseria Gonorrhea: NEGATIVE

## 2022-09-29 ENCOUNTER — Ambulatory Visit (HOSPITAL_BASED_OUTPATIENT_CLINIC_OR_DEPARTMENT_OTHER)
Admission: RE | Admit: 2022-09-29 | Discharge: 2022-09-29 | Disposition: A | Discharge status: Home / Self Care | Payer: 59 | Source: Ambulatory Visit | Attending: Obstetrics and Gynecology | Admitting: Obstetrics and Gynecology

## 2022-09-29 DIAGNOSIS — N858 Other specified noninflammatory disorders of uterus: Secondary | ICD-10-CM | POA: Insufficient documentation

## 2022-10-18 ENCOUNTER — Encounter (HOSPITAL_COMMUNITY): Payer: Self-pay | Admitting: *Deleted

## 2022-10-18 ENCOUNTER — Inpatient Hospital Stay (HOSPITAL_COMMUNITY): Payer: 59

## 2022-10-18 ENCOUNTER — Inpatient Hospital Stay (HOSPITAL_COMMUNITY)
Admission: AD | Admit: 2022-10-18 | Discharge: 2022-10-18 | Disposition: A | Discharge status: Home / Self Care | Payer: 59 | Attending: Family Medicine | Admitting: Family Medicine

## 2022-10-18 DIAGNOSIS — Z3A01 Less than 8 weeks gestation of pregnancy: Secondary | ICD-10-CM | POA: Insufficient documentation

## 2022-10-18 DIAGNOSIS — O30043 Twin pregnancy, dichorionic/diamniotic, third trimester: Secondary | ICD-10-CM

## 2022-10-18 DIAGNOSIS — O30041 Twin pregnancy, dichorionic/diamniotic, first trimester: Secondary | ICD-10-CM | POA: Diagnosis not present

## 2022-10-18 DIAGNOSIS — O26891 Other specified pregnancy related conditions, first trimester: Secondary | ICD-10-CM | POA: Insufficient documentation

## 2022-10-18 DIAGNOSIS — N3000 Acute cystitis without hematuria: Secondary | ICD-10-CM

## 2022-10-18 DIAGNOSIS — O30009 Twin pregnancy, unspecified number of placenta and unspecified number of amniotic sacs, unspecified trimester: Secondary | ICD-10-CM

## 2022-10-18 DIAGNOSIS — Z87891 Personal history of nicotine dependence: Secondary | ICD-10-CM | POA: Insufficient documentation

## 2022-10-18 DIAGNOSIS — R1031 Right lower quadrant pain: Secondary | ICD-10-CM | POA: Insufficient documentation

## 2022-10-18 DIAGNOSIS — O210 Mild hyperemesis gravidarum: Secondary | ICD-10-CM | POA: Insufficient documentation

## 2022-10-18 DIAGNOSIS — O2311 Infections of bladder in pregnancy, first trimester: Secondary | ICD-10-CM | POA: Diagnosis not present

## 2022-10-18 LAB — URINALYSIS, ROUTINE W REFLEX MICROSCOPIC
Bilirubin Urine: NEGATIVE
Glucose, UA: NEGATIVE mg/dL
Ketones, ur: NEGATIVE mg/dL
Nitrite: NEGATIVE
Protein, ur: NEGATIVE mg/dL
Specific Gravity, Urine: 1.026 (ref 1.005–1.030)
pH: 5 (ref 5.0–8.0)

## 2022-10-18 LAB — CBC WITH DIFFERENTIAL/PLATELET
Abs Immature Granulocytes: 0.05 10*3/uL (ref 0.00–0.07)
Basophils Absolute: 0.1 10*3/uL (ref 0.0–0.1)
Basophils Relative: 1 %
Eosinophils Absolute: 0.1 10*3/uL (ref 0.0–0.5)
Eosinophils Relative: 1 %
HCT: 34.4 % — ABNORMAL LOW (ref 36.0–46.0)
Hemoglobin: 11.9 g/dL — ABNORMAL LOW (ref 12.0–15.0)
Immature Granulocytes: 1 %
Lymphocytes Relative: 28 %
Lymphs Abs: 2.7 10*3/uL (ref 0.7–4.0)
MCH: 33.9 pg (ref 26.0–34.0)
MCHC: 34.6 g/dL (ref 30.0–36.0)
MCV: 98 fL (ref 80.0–100.0)
Monocytes Absolute: 0.8 10*3/uL (ref 0.1–1.0)
Monocytes Relative: 8 %
Neutro Abs: 6 10*3/uL (ref 1.7–7.7)
Neutrophils Relative %: 61 %
Platelets: 383 10*3/uL (ref 150–400)
RBC: 3.51 MIL/uL — ABNORMAL LOW (ref 3.87–5.11)
RDW: 12 % (ref 11.5–15.5)
WBC: 9.7 10*3/uL (ref 4.0–10.5)
nRBC: 0 % (ref 0.0–0.2)

## 2022-10-18 LAB — WET PREP, GENITAL
Clue Cells Wet Prep HPF POC: NONE SEEN
Sperm: NONE SEEN
Trich, Wet Prep: NONE SEEN
WBC, Wet Prep HPF POC: <10 (ref <10)
Yeast Wet Prep HPF POC: NONE SEEN

## 2022-10-18 LAB — COMPREHENSIVE METABOLIC PANEL
ALT: 20 U/L (ref 0–44)
AST: 26 U/L (ref 15–41)
Albumin: 3.7 g/dL (ref 3.5–5.0)
Alkaline Phosphatase: 56 U/L (ref 38–126)
Anion gap: 6 (ref 5–15)
BUN: 7 mg/dL (ref 6–20)
CO2: 22 mmol/L (ref 22–32)
Calcium: 9.1 mg/dL (ref 8.9–10.3)
Chloride: 106 mmol/L (ref 98–111)
Creatinine, Ser: 0.68 mg/dL (ref 0.44–1.00)
GFR, Estimated: >60 mL/min (ref >60)
Glucose, Bld: 84 mg/dL (ref 70–99)
Potassium: 4.6 mmol/L (ref 3.5–5.1)
Sodium: 134 mmol/L — ABNORMAL LOW (ref 135–145)
Total Bilirubin: 1.3 mg/dL — ABNORMAL HIGH (ref 0.3–1.2)
Total Protein: 7.1 g/dL (ref 6.5–8.1)

## 2022-10-18 LAB — HCG, QUANTITATIVE, PREGNANCY: hCG, Beta Chain, Quant, S: 40044 m[IU]/mL — ABNORMAL HIGH (ref <5)

## 2022-10-18 MED ORDER — ONDANSETRON 4 MG PO TBDP: 4.0000 mg | ORAL_TABLET | Freq: Four times a day (QID) | ORAL | 1 refills | Status: DC | PRN

## 2022-10-18 MED ORDER — FAMOTIDINE IN NACL 20-0.9 MG/50ML-% IV SOLN
20.0000 mg | Freq: Once | INTRAVENOUS | Status: AC
  Administered 2022-10-18: 20 mg via INTRAVENOUS
  Filled 2022-10-18: qty 50

## 2022-10-18 MED ORDER — LACTATED RINGERS IV BOLUS
1000.0000 mL | Freq: Once | INTRAVENOUS | Status: AC
  Administered 2022-10-18: 1000 mL via INTRAVENOUS

## 2022-10-18 MED ORDER — CEFADROXIL 500 MG PO CAPS: 500.0000 mg | ORAL_CAPSULE | Freq: Two times a day (BID) | ORAL | 0 refills | Status: DC

## 2022-10-18 MED ORDER — SODIUM CHLORIDE 0.9 % IV SOLN
8.0000 mg | Freq: Once | INTRAVENOUS | Status: AC
  Administered 2022-10-18: 8 mg via INTRAVENOUS
  Filled 2022-10-18: qty 4

## 2022-10-18 NOTE — MAU Provider Note (Addendum)
History     CSN: [id number]  Arrival date and time: 10/18/22 0747   None     Chief Complaint  Patient presents with   Abdominal Pain   HPI This is a 32 year old G7 P2-0-4-2 at 6 weeks and 0 days who presents with nausea, vomiting and right lower quadrant abdominal pain.  She has been unable to keep food or liquid down overnight and this morning.  She denies fevers, chills, nausea, vomiting.  The pain is a sharp cramping pain in the right lower quadrant without radiation.  Moving increases pain.  No abnormal vaginal discharge.  OB History     Gravida  7   Para  2   Term  2   Preterm  0   AB  4   Living  2      SAB  3   IAB  0   Ectopic  0   Multiple  0   Live Births  2           Past Medical History:  Diagnosis Date   BV (bacterial vaginosis)    Trichomonas     Past Surgical History:  Procedure Laterality Date   DILATION AND CURETTAGE OF UTERUS      Family History  Problem Relation Age of Onset   Cancer Brother    Hypertension Maternal Grandfather    Diabetes Brother    Heart disease Neg Hx     Social History   Tobacco Use   Smoking status: Former    Types: Cigarettes    Quit date: 04/02/2013    Years since quitting: 9.5   Smokeless tobacco: Former  Building services engineer Use: Never used  Substance Use Topics   Alcohol use: No    Alcohol/week: 0.0 standard drinks of alcohol    Comment: occasionally before pregnancy   Drug use: No    Allergies:  Allergies  Allergen Reactions   Latex Rash   Sulfa Antibiotics Hives    Medications Prior to Admission  Medication Sig Dispense Refill Last Dose   acetaminophen (TYLENOL) 500 MG tablet Take 1,000 mg by mouth every 6 (six) hours as needed for mild pain, moderate pain or headache.  (Patient not taking: Reported on 10/18/2022)   Not Taking   ibuprofen (ADVIL) 600 MG tablet Take 1 tablet (600 mg total) by mouth every 6 (six) hours as needed. (Patient not taking: Reported on 10/18/2022) 30  tablet 0 Not Taking   oxyCODONE-acetaminophen (PERCOCET) 5-325 MG tablet Take 1 tablet by mouth every 6 (six) hours as needed for moderate pain or severe pain. (Patient not taking: Reported on 10/18/2022) 10 tablet 0 Not Taking    Review of Systems Physical Exam   Blood pressure 127/69, pulse 89, temperature 98.1 F (36.7 C), resp. rate 18, height  (1.575 m), weight 68.9 kg, last menstrual period 09/06/2022, unknown if currently breastfeeding.  Physical Exam Vitals and nursing note reviewed.  Constitutional:      Appearance: She is well-developed.  HENT:     Head: Normocephalic and atraumatic.  Cardiovascular:     Rate and Rhythm: Normal rate and regular rhythm.  Pulmonary:     Effort: Pulmonary effort is normal.     Breath sounds: Normal breath sounds.  Abdominal:     General: Abdomen is flat.     Palpations: Abdomen is soft.     Tenderness: There is abdominal tenderness in the right lower quadrant. There is no right CVA tenderness, left CVA  tenderness, guarding or rebound.  Skin:    General: Skin is warm and dry.  Neurological:     Mental Status: She is alert.    Results for orders placed or performed during the hospital encounter of 10/18/22 (from the past 24 hour(s))  Urinalysis, Routine w reflex microscopic -Urine, Clean Catch     Status: Abnormal   Collection Time: 10/18/22  9:00 AM  Result Value Ref Range   Color, Urine YELLOW YELLOW   APPearance CLOUDY (A) CLEAR   Specific Gravity, Urine 1.026 1.005 - 1.030   pH 5.0 5.0 - 8.0   Glucose, UA NEGATIVE NEGATIVE mg/dL   Hgb urine dipstick SMALL (A) NEGATIVE   Bilirubin Urine NEGATIVE NEGATIVE   Ketones, ur NEGATIVE NEGATIVE mg/dL   Protein, ur NEGATIVE NEGATIVE mg/dL   Nitrite NEGATIVE NEGATIVE   Leukocytes,Ua TRACE (A) NEGATIVE   RBC / HPF 0-5 0 - 5 RBC/hpf   WBC, UA 0-5 0 - 5 WBC/hpf   Bacteria, UA FEW (A) NONE SEEN   Squamous Epithelial / HPF 21-50 0 - 5 /HPF   Mucus PRESENT   Wet prep, genital     Status:  None   Collection Time: 10/18/22  9:30 AM  Result Value Ref Range   Yeast Wet Prep HPF POC NONE SEEN NONE SEEN   Trich, Wet Prep NONE SEEN NONE SEEN   Clue Cells Wet Prep HPF POC NONE SEEN NONE SEEN   WBC, Wet Prep HPF POC <10 <10   Sperm NONE SEEN   CBC with Differential/Platelet     Status: Abnormal   Collection Time: 10/18/22  9:39 AM  Result Value Ref Range   WBC 9.7 4.0 - 10.5 K/uL   RBC 3.51 (L) 3.87 - 5.11 MIL/uL   Hemoglobin 11.9 (L) 12.0 - 15.0 g/dL   HCT 40.9 (L) 81.1 - 91.4 %   MCV 98.0 80.0 - 100.0 fL   MCH 33.9 26.0 - 34.0 pg   MCHC 34.6 30.0 - 36.0 g/dL   RDW 78.2 95.6 - 21.3 %   Platelets 383 150 - 400 K/uL   nRBC 0.0 0.0 - 0.2 %   Neutrophils Relative % 61 %   Neutro Abs 6.0 1.7 - 7.7 K/uL   Lymphocytes Relative 28 %   Lymphs Abs 2.7 0.7 - 4.0 K/uL   Monocytes Relative 8 %   Monocytes Absolute 0.8 0.1 - 1.0 K/uL   Eosinophils Relative 1 %   Eosinophils Absolute 0.1 0.0 - 0.5 K/uL   Basophils Relative 1 %   Basophils Absolute 0.1 0.0 - 0.1 K/uL   Immature Granulocytes 1 %   Abs Immature Granulocytes 0.05 0.00 - 0.07 K/uL  Comprehensive metabolic panel     Status: Abnormal   Collection Time: 10/18/22  9:39 AM  Result Value Ref Range   Sodium 134 (L) 135 - 145 mmol/L   Potassium 4.6 3.5 - 5.1 mmol/L   Chloride 106 98 - 111 mmol/L   CO2 22 22 - 32 mmol/L   Glucose, Bld 84 70 - 99 mg/dL   BUN 7 6 - 20 mg/dL   Creatinine, Ser 0.86 0.44 - 1.00 mg/dL   Calcium 9.1 8.9 - 57.8 mg/dL   Total Protein 7.1 6.5 - 8.1 g/dL   Albumin 3.7 3.5 - 5.0 g/dL   AST 26 15 - 41 U/L   ALT 20 0 - 44 U/L   Alkaline Phosphatase 56 38 - 126 U/L   Total Bilirubin 1.3 (H) 0.3 -  1.2 mg/dL   GFR, Estimated >45 >40 mL/min   Anion gap 6 5 - 15  hCG, quantitative, pregnancy     Status: Abnormal   Collection Time: 10/18/22  9:39 AM  Result Value Ref Range   hCG, Beta Chain, Quant, S 40,044 (H) <5 mIU/mL   US OB LESS THAN 14 WEEKS WITH OB TRANSVAGINAL  Result Date:  10/18/2022 CLINICAL DATA:  Right lower quadrant abdominal pain. EXAM: TWIN OBSTETRIC <14WK [place] AND TRANSVAGINAL OB US TECHNIQUE: Both transabdominal and transvaginal ultrasound examinations were performed for complete evaluation of the gestation as well as the maternal uterus, adnexal regions, and pelvic cul-de-sac. Transvaginal technique was performed to assess early pregnancy. COMPARISON:  September 29, 2022. FINDINGS: Number of IUPs:  2 Chorionicity/Amnionicity:  Dichorionic-diamniotic (thick membrane) TWIN 1 Yolk sac:  Visualized. Embryo:  Visualized. Cardiac Activity: Visualized. Heart Rate: 96 bpm CRL:  2.5 mm   5 w 6 d                  [place] EDC: June 14, 2023 TWIN 2 Yolk sac:  Visualized. Embryo:  Visualized. Cardiac Activity: Visualized. Heart Rate: 106 bpm CRL:  1.9 mm   gestational age could not be registered. Subchorionic hemorrhage:  None visualized. Maternal uterus/adnexae: Ovaries are unremarkable. No free fluid is noted. IMPRESSION: Twin live intrauterine gestations are noted with the largest at approximately 5 weeks 6 days of gestational age. Electronically Signed   By: Lupita Raider M.D.   On: 10/18/2022 12:39   US OB Comp AddL Gest Less 14 Wks  Result Date: 10/18/2022 CLINICAL DATA:  Right lower quadrant abdominal pain. EXAM: TWIN OBSTETRIC <14WK [place] AND TRANSVAGINAL OB US TECHNIQUE: Both transabdominal and transvaginal ultrasound examinations were performed for complete evaluation of the gestation as well as the maternal uterus, adnexal regions, and pelvic cul-de-sac. Transvaginal technique was performed to assess early pregnancy. COMPARISON:  September 29, 2022. FINDINGS: Number of IUPs:  2 Chorionicity/Amnionicity:  Dichorionic-diamniotic (thick membrane) TWIN 1 Yolk sac:  Visualized. Embryo:  Visualized. Cardiac Activity: Visualized. Heart Rate: 96 bpm CRL:  2.5 mm   5 w 6 d                  [place] EDC: June 14, 2023 TWIN 2 Yolk sac:  Visualized. Embryo:  Visualized. Cardiac Activity: Visualized.  Heart Rate: 106 bpm CRL:  1.9 mm   gestational age could not be registered. Subchorionic hemorrhage:  None visualized. Maternal uterus/adnexae: Ovaries are unremarkable. No free fluid is noted. IMPRESSION: Twin live intrauterine gestations are noted with the largest at approximately 5 weeks 6 days of gestational age. Electronically Signed   By: Lupita Raider M.D.   On: 10/18/2022 12:39     MAU Course  Procedures  MDM IVF given, along with zofran  IV and pepcid  IV. UA shows bacteria - possible UTI Bilirubin slightly elevated.  Imaging: I independent reviewed the images of the [place]. Twin gestations with CRL measuring [redacted]w[redacted]d with FHR of 96 for baby A and 106 for baby B.   Assessment and Plan   1. [redacted] weeks gestation of pregnancy   2. Acute cystitis without hematuria   3. Hyperemesis affecting pregnancy, antepartum   4. Dichorionic diamniotic twin pregnancy in first trimester    Zofran prescribed for hyperemesis. Cefadroxil prescribed for UTI.  F/u with primary ob.  Levie Heritage 10/18/2022, 1:19 PM

## 2022-10-18 NOTE — MAU Note (Signed)
.  [name] is a 32 y.o. at [redacted]w[redacted]d here in MAU reporting: Started having cramping this morning and c/o nausea. Has zofran Rx but has not picked it up yet. Denies any vag bleeding or discharge.  LMP: 09/06/22 Onset of complaint: this morning Pain score: 7 Vitals:   10/18/22 0802  BP: 127/69  Pulse: 89  Resp: 18  Temp: 98.1 F (36.7 C)     FHT:n/a Lab orders placed from triage:  u/a

## 2022-10-19 LAB — GC/CHLAMYDIA PROBE AMP (~~LOC~~) NOT AT ARMC
Chlamydia: NEGATIVE
Comment: NEGATIVE
Comment: NORMAL
Neisseria Gonorrhea: NEGATIVE

## 2022-10-19 LAB — CULTURE, OB URINE: Culture: <10000 — AB

## 2022-10-28 ENCOUNTER — Telehealth: Payer: 59 | Admitting: Obstetrics and Gynecology

## 2022-11-09 ENCOUNTER — Ambulatory Visit (INDEPENDENT_AMBULATORY_CARE_PROVIDER_SITE_OTHER): Payer: 59

## 2022-11-09 ENCOUNTER — Other Ambulatory Visit (INDEPENDENT_AMBULATORY_CARE_PROVIDER_SITE_OTHER): Payer: 59

## 2022-11-09 VITALS — BP 124/80 | HR 88 | Wt 160.9 lb

## 2022-11-09 DIAGNOSIS — R768 Other specified abnormal immunological findings in serum: Secondary | ICD-10-CM

## 2022-11-09 DIAGNOSIS — Z1339 Encounter for screening examination for other mental health and behavioral disorders: Secondary | ICD-10-CM

## 2022-11-09 DIAGNOSIS — Z3A00 Weeks of gestation of pregnancy not specified: Secondary | ICD-10-CM | POA: Diagnosis not present

## 2022-11-09 DIAGNOSIS — Z3481 Encounter for supervision of other normal pregnancy, first trimester: Secondary | ICD-10-CM

## 2022-11-09 DIAGNOSIS — O0990 Supervision of high risk pregnancy, unspecified, unspecified trimester: Secondary | ICD-10-CM | POA: Insufficient documentation

## 2022-11-09 DIAGNOSIS — Z3A09 9 weeks gestation of pregnancy: Secondary | ICD-10-CM

## 2022-11-09 DIAGNOSIS — O368390 Maternal care for abnormalities of the fetal heart rate or rhythm, unspecified trimester, not applicable or unspecified: Secondary | ICD-10-CM

## 2022-11-09 DIAGNOSIS — O30041 Twin pregnancy, dichorionic/diamniotic, first trimester: Secondary | ICD-10-CM

## 2022-11-09 DIAGNOSIS — Z3491 Encounter for supervision of normal pregnancy, unspecified, first trimester: Secondary | ICD-10-CM | POA: Insufficient documentation

## 2022-11-09 DIAGNOSIS — O0991 Supervision of high risk pregnancy, unspecified, first trimester: Secondary | ICD-10-CM

## 2022-11-09 MED ORDER — BLOOD PRESSURE KIT DEVI
1.0000 | 0 refills | Status: DC
Start: 2022-11-09 — End: 2023-03-23

## 2022-11-09 MED ORDER — GOJJI WEIGHT SCALE MISC
1.0000 | 0 refills | Status: DC
Start: 2022-11-09 — End: 2023-03-23

## 2022-11-09 NOTE — Progress Notes (Signed)
New OB Intake  I connected with [name]  on 11/09/22 at  1:10 PM EDT by in person and verified that I am speaking with the correct person using two identifiers. Nurse is located at [name] and pt is located at [name].  I discussed the limitations, risks, security and privacy concerns of performing an evaluation and management service by telephone and the availability of in person appointments. I also discussed with the patient that there may be a patient responsible charge related to this service. The patient expressed understanding and agreed to proceed.  I explained I am completing New OB Intake today. We discussed EDD of 06/13/23 that is based on LMP of 09/06/22. Pt is G7/P2042. I reviewed her allergies, medications, Medical/Surgical/OB history, and appropriate screenings. I informed her of [name]. [name]. Based on history, this is a high risk pregnancy.  Patient Active Problem List   Diagnosis Date Noted   Dichorionic diamniotic twin pregnancy in first trimester 10/18/2022   Uterine mass 08/11/2022   SAB (spontaneous abortion) 04/14/2022   Trichimoniasis 02/26/2019   Hypercholesteremia 02/22/2019   Hypertriglyceridemia 02/22/2019   Dysmenorrhea 02/19/2019   Obesity (BMI 30-39.9) 03/15/2016    Concerns addressed today  Delivery Plans Plans to deliver at [name]. Patient given information for [name] website for more information about [name]. Patient is not interested in water birth. Offered upcoming OB visit with CNM to discuss further.  MyChart/Babyscripts MyChart access verified. I explained pt will have some visits in office and some virtually. Babyscripts instructions given and order placed. Patient verifies receipt of registration text/e-mail. Account successfully created and app downloaded.  Blood Pressure Cuff/Weight Scale Blood pressure cuff ordered for patient to pick-up from [name]. Explained  after first prenatal appt pt will check weekly and document in Babyscripts. Patient does not have weight scale; order sent to [name], patient may track weight weekly in Babyscripts.  Anatomy US Explained first scheduled [place] will be around 19 weeks. Anatomy US TBD.  Labs Discussed Avelina Laine genetic screening with patient. Would like both Panorama and Horizon drawn at new OB visit. Routine prenatal labs needed.  COVID Vaccine Patient has had COVID vaccine.   Is patient a CenteringPregnancy candidate?  Not a Candidate Declined due to Group setting Not a candidate due to    If accepted,  Social Determinants of Health Food Insecurity: Patient denies food insecurity. WIC Referral: Patient is interested in referral to [facility].  Transportation: Patient denies transportation needs. Childcare: Discussed no children allowed at ultrasound appointments. Offered childcare services; patient declines childcare services at this time.  Interested in [place]? If yes, send referral and doula dot phrase.   First visit review I reviewed new OB appt with patient. I explained they will have a provider visit that includes discuss plan of care for pregnancy and genetic screening. Explained pt will be seen by Sharen Counter at first visit; encounter routed to appropriate provider. Explained that patient will be seen by pregnancy navigator following visit with provider.   [name], RN 11/09/2022  1:23 PM

## 2022-11-11 ENCOUNTER — Encounter: Payer: Self-pay | Admitting: Advanced Practice Midwife

## 2022-11-11 DIAGNOSIS — R768 Other specified abnormal immunological findings in serum: Secondary | ICD-10-CM | POA: Insufficient documentation

## 2022-11-11 LAB — CBC/D/PLT+RPR+RH+ABO+RUBIGG...
Basophils Absolute: 0.1 10*3/uL (ref 0.0–0.2)
Basos: 1 %
EOS (ABSOLUTE): 0.1 10*3/uL (ref 0.0–0.4)
Eos: 1 %
HCV Ab: NONREACTIVE
HIV Screen 4th Generation wRfx: NONREACTIVE
Hematocrit: 34.1 % (ref 34.0–46.6)
Hemoglobin: 11.4 g/dL (ref 11.1–15.9)
Hepatitis B Surface Ag: NEGATIVE
Immature Grans (Abs): 0.1 10*3/uL (ref 0.0–0.1)
Immature Granulocytes: 1 %
Lymphocytes Absolute: 2.4 10*3/uL (ref 0.7–3.1)
Lymphs: 21 %
MCH: 32 pg (ref 26.6–33.0)
MCHC: 33.4 g/dL (ref 31.5–35.7)
MCV: 96 fL (ref 79–97)
Monocytes Absolute: 0.8 10*3/uL (ref 0.1–0.9)
Monocytes: 7 %
Neutrophils Absolute: 8.2 10*3/uL — ABNORMAL HIGH (ref 1.4–7.0)
Neutrophils: 69 %
Platelets: 338 10*3/uL (ref 150–450)
RBC: 3.56 x10E6/uL — ABNORMAL LOW (ref 3.77–5.28)
RDW: 12.3 % (ref 11.7–15.4)
RPR Ser Ql: NONREACTIVE
Rh Factor: POSITIVE
Rubella Antibodies, IGG: 2.38 index (ref >0.99)
WBC: 11.7 10*3/uL — ABNORMAL HIGH (ref 3.4–10.8)

## 2022-11-11 LAB — AB SCR+ANTIBODY ID: Antibody Screen: POSITIVE — AB

## 2022-11-11 LAB — HCV INTERPRETATION

## 2022-11-24 ENCOUNTER — Encounter (HOSPITAL_COMMUNITY): Payer: Self-pay | Admitting: Obstetrics and Gynecology

## 2022-11-24 ENCOUNTER — Inpatient Hospital Stay (HOSPITAL_COMMUNITY)
Admission: AD | Admit: 2022-11-24 | Discharge: 2022-11-24 | Disposition: A | Discharge status: Home / Self Care | Payer: 59 | Attending: Obstetrics and Gynecology | Admitting: Obstetrics and Gynecology

## 2022-11-24 DIAGNOSIS — R110 Nausea: Secondary | ICD-10-CM | POA: Insufficient documentation

## 2022-11-24 DIAGNOSIS — R102 Pelvic and perineal pain: Secondary | ICD-10-CM | POA: Diagnosis not present

## 2022-11-24 DIAGNOSIS — O26891 Other specified pregnancy related conditions, first trimester: Secondary | ICD-10-CM | POA: Diagnosis not present

## 2022-11-24 DIAGNOSIS — O208 Other hemorrhage in early pregnancy: Secondary | ICD-10-CM | POA: Diagnosis not present

## 2022-11-24 DIAGNOSIS — O418X12 Other specified disorders of amniotic fluid and membranes, first trimester, fetus 2: Secondary | ICD-10-CM | POA: Diagnosis not present

## 2022-11-24 DIAGNOSIS — O468X1 Other antepartum hemorrhage, first trimester: Secondary | ICD-10-CM

## 2022-11-24 DIAGNOSIS — O99611 Diseases of the digestive system complicating pregnancy, first trimester: Secondary | ICD-10-CM | POA: Insufficient documentation

## 2022-11-24 DIAGNOSIS — O219 Vomiting of pregnancy, unspecified: Secondary | ICD-10-CM | POA: Diagnosis not present

## 2022-11-24 DIAGNOSIS — O30041 Twin pregnancy, dichorionic/diamniotic, first trimester: Secondary | ICD-10-CM | POA: Diagnosis not present

## 2022-11-24 DIAGNOSIS — K5900 Constipation, unspecified: Secondary | ICD-10-CM | POA: Diagnosis not present

## 2022-11-24 DIAGNOSIS — Z3A11 11 weeks gestation of pregnancy: Secondary | ICD-10-CM

## 2022-11-24 DIAGNOSIS — R109 Unspecified abdominal pain: Secondary | ICD-10-CM

## 2022-11-24 LAB — WET PREP, GENITAL
Clue Cells Wet Prep HPF POC: NONE SEEN
Sperm: NONE SEEN
Trich, Wet Prep: NONE SEEN
WBC, Wet Prep HPF POC: >=10 — AB (ref <10)
Yeast Wet Prep HPF POC: NONE SEEN

## 2022-11-24 LAB — URINALYSIS, ROUTINE W REFLEX MICROSCOPIC
Bilirubin Urine: NEGATIVE
Glucose, UA: NEGATIVE mg/dL
Ketones, ur: NEGATIVE mg/dL
Leukocytes,Ua: NEGATIVE
Nitrite: NEGATIVE
Protein, ur: NEGATIVE mg/dL
Specific Gravity, Urine: >1.03 — ABNORMAL HIGH (ref 1.005–1.030)
pH: 6 (ref 5.0–8.0)

## 2022-11-24 LAB — URINALYSIS, MICROSCOPIC (REFLEX)

## 2022-11-24 MED ORDER — PROMETHAZINE HCL 25 MG PO TABS: 25.0000 mg | ORAL_TABLET | Freq: Four times a day (QID) | ORAL | 0 refills | Status: DC | PRN

## 2022-11-24 NOTE — MAU Note (Signed)
[name] is a 32 y.o. at [redacted]w[redacted]d here in MAU reporting: is cramping really bad, started yesterday early and has gotten worse.  Usually goes away, but it has continued. No bleeding or d/c. Has been constipated. No urinary issues.  Onset of complaint: yesterday morning Pain score: 8 Vitals:   11/24/22 0801  BP: 119/64  Pulse: 99  Resp: 18  Temp: 98.1 F (36.7 C)  SpO2: 100%     ZOX:WRUEA, has dress on Lab orders placed from triage:  urine, swabs collected.

## 2022-11-24 NOTE — MAU Provider Note (Signed)
History     [id number]  Arrival date and time: 11/24/22 7829    Chief Complaint  Patient presents with   Abdominal Pain     HPI [facility] [name] is a 32 y.o. at [redacted]w[redacted]d by 5 wk [place] with PMHx notable for current di/di twin pregnancy, who presents for cramping.   Patient reports lower abdominal/pelvic cramping since yesterday Very worried as this feels similar to when she had a miscarriage last year Has had problems with nausea in this pregnancy, they are ongoing, not really taking anti-emetics as she feels they don't work Denies any fevers, vaginal discharge, vaginal bleeding, vaginal leaking of fluid, burning or pain with urination, or diarrhea Does endorse some constipation Pain is bilateral, not worse on one side or the other She has not taken anything for this pain as she is paranoid about doing so   A/Positive/-- (05/14 1407)  OB History     Gravida  7   Para  2   Term  2   Preterm  0   AB  4   Living  2      SAB  3   IAB  1   Ectopic  0   Multiple  0   Live Births  2           Past Medical History:  Diagnosis Date   BV (bacterial vaginosis)    Trichomonas     Past Surgical History:  Procedure Laterality Date   DILATION AND CURETTAGE OF UTERUS      Family History  Problem Relation Age of Onset   Kidney Stones Mother    Healthy Father    Cancer Brother    Diabetes Brother    Hypertension Maternal Grandfather    Heart disease Neg Hx     Social History   Socioeconomic History   Marital status: Single    Spouse name: Not on file   Number of children: Not on file   Years of education: Not on file   Highest education level: Not on file  Occupational History   Not on file  Tobacco Use   Smoking status: Former    Types: Cigarettes    Quit date: 04/02/2013    Years since quitting: 9.6   Smokeless tobacco: Never  Vaping Use   Vaping Use: Never used  Substance and Sexual Activity   Alcohol use: Not Currently    Comment:  occasionally before pregnancy   Drug use: No   Sexual activity: Yes    Partners: Male    Birth control/protection: None    Comment: currently pregnant  Other Topics Concern   Not on file  Social History Narrative   Not on file   Social Determinants of Health   Financial Resource Strain: Not on file  Food Insecurity: Not on file  Transportation Needs: Not on file  Physical Activity: Not on file  Stress: Not on file  Social Connections: Not on file  Intimate Partner Violence: Not on file    Allergies  Allergen Reactions   Latex Rash   Sulfa Antibiotics Hives    No current facility-administered medications on file prior to encounter.   Current Outpatient Medications on File Prior to Encounter  Medication Sig Dispense Refill   ondansetron (ZOFRAN-ODT) 4 MG disintegrating tablet Take 1 tablet (4 mg total) by mouth every 6 (six) hours as needed for nausea. 60 tablet 1   Prenatal MV & Min w/FA-DHA (PRENATAL GUMMIES PO) Take 2 tablets by mouth  daily.     atomoxetine (STRATTERA) 40 MG capsule Take 1 tablet by mouth daily. (Patient not taking: Reported on 11/09/2022)     Blood Pressure Monitoring (BLOOD PRESSURE KIT) DEVI 1 kit by Does not apply route once a week. 1 each 0   cefadroxil (DURICEF) 500 MG capsule Take 1 capsule (500 mg total) by mouth 2 (two) times daily. 14 capsule 0   Misc. Devices (GOJJI WEIGHT SCALE) MISC 1 Device by Does not apply route every 30 (thirty) days. 1 each 0     ROS Pertinent positives and negative per HPI, all others reviewed and negative  Physical Exam   BP 119/64 (BP Location: Right Arm)   Pulse 99   Temp 98.1 F (36.7 C) (Oral)   Resp 18   Ht 5\' 2"  (1.575 m)   Wt 69.6 kg   LMP 09/06/2022   SpO2 100%   BMI 28.06 kg/m   Patient Vitals for the past 24 hrs:  BP Temp Temp src Pulse Resp SpO2 Height Weight  11/24/22 0801 119/64 98.1 F (36.7 C) Oral 99 18 100 % 5\' 2"  (1.575 m) 69.6 kg    Physical Exam Vitals reviewed.  Constitutional:       General: She is not in acute distress.    Appearance: She is well-developed. She is not diaphoretic.  Eyes:     General: No scleral icterus. Pulmonary:     Effort: Pulmonary effort is normal. No respiratory distress.  Abdominal:     General: There is no distension.     Palpations: Abdomen is soft.     Tenderness: There is abdominal tenderness in the right lower quadrant, suprapubic area and left lower quadrant. There is no guarding or rebound.  Skin:    General: Skin is warm and dry.  Neurological:     Mental Status: She is alert.     Coordination: Coordination normal.      Cervical Exam    Bedside Ultrasound Pt informed that the ultrasound is considered a limited OB ultrasound and is not intended to be a complete ultrasound exam.  Patient also informed that the ultrasound is not being completed with the intent of assessing for fetal or placental anomalies or any pelvic abnormalities.  Explained that the purpose of today's ultrasound is to assess for  viability.  Patient acknowledges the purpose of the exam and the limitations of the study.      My interpretation: First trimester findings: Gestational sac summary: di/di twin gestation seen with clear lambda sign, both fetuses with subjectively normal fluid and somatic movement. Fetal cardiac activity: 162 for twin A, 154 for twin B. Possible subchorionic hematoma of anterior placenta.  Labs Results for orders placed or performed during the hospital encounter of 11/24/22 (from the past 24 hour(s))  Wet prep, genital     Status: Abnormal   Collection Time: 11/24/22  8:26 AM  Result Value Ref Range   Yeast Wet Prep HPF POC NONE SEEN NONE SEEN   Trich, Wet Prep NONE SEEN NONE SEEN   Clue Cells Wet Prep HPF POC NONE SEEN NONE SEEN   WBC, Wet Prep HPF POC >=10 (A) <10   Sperm NONE SEEN     Imaging No results found.  MAU Course  Procedures Lab Orders         Wet prep, genital         Urinalysis, Routine w reflex  microscopic -Urine, Clean Catch    Meds ordered this encounter  Medications  promethazine (PHENERGAN) 25 MG tablet    Sig: Take 1 tablet (25 mg total) by mouth every 6 (six) hours as needed for nausea or vomiting.    Dispense:  30 tablet    Refill:  0   Imaging Orders  No imaging studies ordered today    MDM Moderate (Level 3-4)  Assessment and Plan  #Abdominal pain in pregnancy, first trimester #Subchorionic hematoma #[redacted] weeks gestation of pregnancy Reassured patient that bedside US shows normal FHR for both twins, though a possible subchorionic hematoma is seen which may be the etiology of her cramping. Discussed no known interventions but outcomes are often good. Wet prep negative, UA pending but no symptoms so infection as an etiology seems unlikely. Patient already has prenatal care scheduled.   #Nausea of pregnancy Discussed that sometimes taking anti-emetics on a schedule is necessary and may be helpful. Also reviewed not letting her stomach get empty, constant grazing, etc.   #FWB Twin A FHR 162 Twin B FHR 154   Dispo: discharged to home in stable condition    Venora Maples, MD/MPH 11/24/22 9:09 AM  Allergies as of 11/24/2022       Reactions   Latex Rash   Sulfa Antibiotics Hives        Medication List     STOP taking these medications    cefadroxil 500 MG capsule Commonly known as: DURICEF       TAKE these medications    atomoxetine 40 MG capsule Commonly known as: STRATTERA Take 1 tablet by mouth daily.   Blood Pressure Kit Devi 1 kit by Does not apply route once a week.   Gojji Weight Scale Misc 1 Device by Does not apply route every 30 (thirty) days.   ondansetron 4 MG disintegrating tablet Commonly known as: ZOFRAN-ODT Take 1 tablet (4 mg total) by mouth every 6 (six) hours as needed for nausea.   PRENATAL GUMMIES PO Take 2 tablets by mouth daily.   promethazine 25 MG tablet Commonly known as: PHENERGAN Take 1 tablet (25  mg total) by mouth every 6 (six) hours as needed for nausea or vomiting.

## 2022-11-25 LAB — GC/CHLAMYDIA PROBE AMP (~~LOC~~) NOT AT ARMC
Chlamydia: NEGATIVE
Comment: NEGATIVE
Comment: NORMAL
Neisseria Gonorrhea: NEGATIVE

## 2022-11-30 ENCOUNTER — Encounter: Payer: Self-pay | Admitting: Obstetrics and Gynecology

## 2022-11-30 ENCOUNTER — Ambulatory Visit (INDEPENDENT_AMBULATORY_CARE_PROVIDER_SITE_OTHER): Payer: 59 | Admitting: Obstetrics and Gynecology

## 2022-11-30 VITALS — BP 123/78 | HR 90 | Wt 155.4 lb

## 2022-11-30 DIAGNOSIS — O0991 Supervision of high risk pregnancy, unspecified, first trimester: Secondary | ICD-10-CM

## 2022-11-30 DIAGNOSIS — Z3A12 12 weeks gestation of pregnancy: Secondary | ICD-10-CM | POA: Diagnosis not present

## 2022-11-30 DIAGNOSIS — R768 Other specified abnormal immunological findings in serum: Secondary | ICD-10-CM | POA: Diagnosis not present

## 2022-11-30 DIAGNOSIS — O219 Vomiting of pregnancy, unspecified: Secondary | ICD-10-CM

## 2022-11-30 DIAGNOSIS — O30041 Twin pregnancy, dichorionic/diamniotic, first trimester: Secondary | ICD-10-CM

## 2022-11-30 MED ORDER — ASPIRIN 81 MG PO CHEW
81.0000 mg | CHEWABLE_TABLET | Freq: Every day | ORAL | 5 refills | Status: DC
Start: 2022-11-30 — End: 2023-05-17

## 2022-11-30 MED ORDER — DOXYLAMINE-PYRIDOXINE 10-10 MG PO TBEC
2.0000 | DELAYED_RELEASE_TABLET | Freq: Every day | ORAL | 5 refills | Status: DC
Start: 2022-11-30 — End: 2023-05-04

## 2022-11-30 MED ORDER — SCOPOLAMINE 1 MG/3DAYS TD PT72
1.0000 | MEDICATED_PATCH | TRANSDERMAL | 1 refills | Status: DC
Start: 2022-11-30 — End: 2023-04-06

## 2022-11-30 NOTE — Progress Notes (Signed)
INITIAL PRENATAL VISIT NOTE  Subjective:  [name] is a 32 y.o. [id number] at [redacted]w[redacted]d by LMP being seen today for her initial prenatal visit.  She has an obstetric history significant for SVD x 2. She has an uncomplicated medical history .  Currently she is experiencing mild  to moderate abdominal pain and nausea/vomiting.  Patient reports nausea and intermittent cramping .  Contractions: Not present. Vag. Bleeding: None.  Movement: Present. Denies leaking of fluid.    Past Medical History:  Diagnosis Date   BV (bacterial vaginosis)    Trichomonas     Past Surgical History:  Procedure Laterality Date   DILATION AND CURETTAGE OF UTERUS      OB History  Gravida Para Term Preterm AB Living  7 2 2  0 4 2  SAB IAB Ectopic Multiple Live Births  3 1 0 0 2    # Outcome Date GA Lbr Len/2nd Weight Sex Delivery Anes PTL Lv  7 Current           6 IAB 2022     TAB     5 Term 09/05/16 [redacted]w[redacted]d 10:07 / 00:24 6 lb 11.8 oz (3.055 kg) M Vag-Spont EPI  LIV  4 Term 12/06/13 [redacted]w[redacted]d 10:43 / 00:44 7 lb 0.2 oz (3.181 kg) M Vag-Spont Local, EPI  LIV  3 SAB 06/28/09 [redacted]w[redacted]d         2 SAB           1 SAB             Social History   Socioeconomic History   Marital status: Single    Spouse name: Not on file   Number of children: Not on file   Years of education: Not on file   Highest education level: Not on file  Occupational History   Not on file  Tobacco Use   Smoking status: Former    Types: Cigarettes    Quit date: 04/02/2013    Years since quitting: 9.6   Smokeless tobacco: Never  Vaping Use   Vaping Use: Never used  Substance and Sexual Activity   Alcohol use: Not Currently    Comment: occasionally before pregnancy   Drug use: No   Sexual activity: Yes    Partners: Male    Birth control/protection: None    Comment: currently pregnant  Other Topics Concern   Not on file  Social History Narrative   Not on file   Social Determinants of Health   Financial Resource Strain: Not  on file  Food Insecurity: Not on file  Transportation Needs: Not on file  Physical Activity: Not on file  Stress: Not on file  Social Connections: Not on file    Family History  Problem Relation Age of Onset   Kidney Stones Mother    Healthy Father    Cancer Brother    Diabetes Brother    Hypertension Maternal Grandfather    Heart disease Neg Hx      Current Outpatient Medications:    aspirin 81 MG chewable tablet, Chew 1 tablet (81 mg total) by mouth daily. Start at 15 weeks, Disp: 30 tablet, Rfl: 5   Doxylamine-Pyridoxine (DICLEGIS) 10-10 MG TBEC, Take 2 tablets by mouth at bedtime. If symptoms persist, add one tablet in the morning and one in the afternoon, Disp: 100 tablet, Rfl: 5   ondansetron (ZOFRAN-ODT) 4 MG disintegrating tablet, Take 1 tablet (4 mg total) by mouth every 6 (six) hours as needed for  nausea., Disp: 60 tablet, Rfl: 1   Prenatal MV & Min w/FA-DHA (PRENATAL GUMMIES PO), Take 2 tablets by mouth daily., Disp: , Rfl:    promethazine (PHENERGAN) 25 MG tablet, Take 1 tablet (25 mg total) by mouth every 6 (six) hours as needed for nausea or vomiting., Disp: 30 tablet, Rfl: 0   scopolamine (TRANSDERM-SCOP) 1 MG/3DAYS, Place 1 patch (1.5 mg total) onto the skin every 3 (three) days., Disp: 10 patch, Rfl: 1   atomoxetine (STRATTERA) 40 MG capsule, Take 1 tablet by mouth daily. (Patient not taking: Reported on 11/09/2022), Disp: , Rfl:    Blood Pressure Monitoring (BLOOD PRESSURE KIT) DEVI, 1 kit by Does not apply route once a week. (Patient not taking: Reported on 11/30/2022), Disp: 1 each, Rfl: 0   Misc. Devices (GOJJI WEIGHT SCALE) MISC, 1 Device by Does not apply route every 30 (thirty) days. (Patient not taking: Reported on 11/30/2022), Disp: 1 each, Rfl: 0  Allergies  Allergen Reactions   Latex Rash   Sulfa Antibiotics Hives    Review of Systems: Negative except for what is mentioned in HPI.  Objective:   Vitals:   11/30/22 1516  BP: 123/78  Pulse: 90  Weight:  155 lb 6.4 oz (70.5 kg)    Fetal Status: Fetal Heart Rate (bpm): 156/160 Fundal Height: 21 cm Movement: Present     Physical Exam: BP 123/78   Pulse 90   Wt 155 lb 6.4 oz (70.5 kg)   LMP 09/06/2022   BMI 28.42 kg/m  CONSTITUTIONAL: Well-developed, well-nourished female in no acute distress.  NEUROLOGIC: Alert and oriented to person, place, and time. Normal reflexes, muscle tone coordination. No cranial nerve deficit noted. PSYCHIATRIC: Normal mood and affect. Normal behavior. Normal judgment and thought content. SKIN: Skin is warm and dry. No rash noted. Not diaphoretic. No erythema. No pallor. HENT:  Normocephalic, atraumatic, External right and left ear normal. Oropharynx is clear and moist EYES: Conjunctivae and EOM are normal.  NECK: Normal range of motion, supple, no masses CARDIOVASCULAR: Normal heart rate noted, regular rhythm RESPIRATORY: Effort and breath sounds normal, no problems with respiration noted BREASTS: deferred ABDOMEN: Soft, nontender, nondistended, gravid. GU: deferred MUSCULOSKELETAL: Normal range of motion. EXT:  No edema and no tenderness. 2+ distal pulses.   Assessment and Plan:  Pregnancy: [id number] at [redacted]w[redacted]d by LMP  1. [redacted] weeks gestation of pregnancy   2. Supervision of high risk pregnancy in first trimester Continue routine prenatal care  - HORIZON Basic Panel - PANORAMA PRENATAL TEST - aspirin 81 MG chewable tablet; Chew 1 tablet (81 mg total) by mouth daily. Start at 15 weeks  Dispense: 30 tablet; Refill: 5  3. Dichorionic diamniotic twin pregnancy in first trimester Start baby ASA at 15 weeks due to multiple gestation Anatomy scan scheduled 7/24 - [place] MFM OB DETAIL +14 WK; Future - [place] MFM OB DETAIL ADDL GEST +14 WK; Future - aspirin 81 MG chewable tablet; Chew 1 tablet (81 mg total) by mouth daily. Start at 15 weeks  Dispense: 30 tablet; Refill: 5  4. Anti -Lewis a antibody positive No intervention currently  5. Nausea and vomiting during  pregnancy Pt is currently working at [employer] is trying to transition to daytime hours.  Rx for diclegis and scopolamine patch sent.  Has not been able to take phenergan yet because it makes her sleepy.  - Doxylamine-Pyridoxine (DICLEGIS) 10-10 MG TBEC; Take 2 tablets by mouth at bedtime. If symptoms persist, add one tablet in the morning and  one in the afternoon  Dispense: 100 tablet; Refill: 5 - scopolamine (TRANSDERM-SCOP) 1 MG/3DAYS; Place 1 patch (1.5 mg total) onto the skin every 3 (three) days.  Dispense: 10 patch; Refill: 1   Preterm labor symptoms and general obstetric precautions including but not limited to vaginal bleeding, contractions, leaking of fluid and fetal movement were reviewed in detail with the patient.  Please refer to After Visit Summary for other counseling recommendations.   Return in about 4 weeks (around 12/28/2022) for [facility], in person.  Warden Fillers 11/30/2022 4:30 PM

## 2022-11-30 NOTE — Progress Notes (Signed)
Pt presents for NOB visit. Reports nausea and vomiting declines antiemetics. Agrees to all genetic testings today.

## 2022-12-07 LAB — PANORAMA PRENATAL TEST FULL PANEL:PANORAMA TEST PLUS 5 ADDITIONAL MICRODELETIONS
FETAL FRACTION SECOND FETUS: 4.5
FETAL FRACTION: 5.6

## 2022-12-08 LAB — HORIZON CUSTOM: REPORT SUMMARY: NEGATIVE

## 2022-12-28 ENCOUNTER — Encounter: Payer: 59 | Admitting: Obstetrics and Gynecology

## 2023-01-05 ENCOUNTER — Ambulatory Visit (INDEPENDENT_AMBULATORY_CARE_PROVIDER_SITE_OTHER): Payer: 59 | Admitting: Student

## 2023-01-05 VITALS — BP 106/70 | HR 93 | Wt 166.0 lb

## 2023-01-05 DIAGNOSIS — R42 Dizziness and giddiness: Secondary | ICD-10-CM

## 2023-01-05 DIAGNOSIS — O30042 Twin pregnancy, dichorionic/diamniotic, second trimester: Secondary | ICD-10-CM

## 2023-01-05 DIAGNOSIS — O0991 Supervision of high risk pregnancy, unspecified, first trimester: Secondary | ICD-10-CM

## 2023-01-05 DIAGNOSIS — R768 Other specified abnormal immunological findings in serum: Secondary | ICD-10-CM

## 2023-01-05 DIAGNOSIS — Z3A17 17 weeks gestation of pregnancy: Secondary | ICD-10-CM

## 2023-01-05 DIAGNOSIS — N858 Other specified noninflammatory disorders of uterus: Secondary | ICD-10-CM

## 2023-01-05 DIAGNOSIS — R7689 Other specified abnormal immunological findings in serum: Secondary | ICD-10-CM

## 2023-01-05 NOTE — Progress Notes (Signed)
Pt complains of dizziness and lightheadedness.  Pt states she is eating/drinking ok.  Pt has been trying to use scope patch - having trouble with it staying on.  Pt also having some cramping and back pain.

## 2023-01-05 NOTE — Progress Notes (Signed)
   PRENATAL VISIT NOTE  Subjective:  [name] is a 32 y.o. [id number] at [redacted]w[redacted]d being seen today for ongoing prenatal care.  She is currently monitored for the following issues for this low-risk pregnancy and has Uterine mass; Dichorionic diamniotic twin pregnancy in first trimester; Supervision of high-risk pregnancy; and Anti -Lewis a antibody positive on their problem list.  Patient reports backache and dizziness .  Contractions: Irritability. Vag. Bleeding: None.  Movement: Present. Denies leaking of fluid.   The following portions of the patient's history were reviewed and updated as appropriate: allergies, current medications, past family history, past medical history, past social history, past surgical history and problem list.   Objective:   Vitals:   01/05/23 1058  BP: 106/70  Pulse: 93  Weight: 166 lb (75.3 kg)    Fetal Status:     Movement: Present     General:  Alert, oriented and cooperative. Patient is in no acute distress.  Skin: Skin is warm and dry. No rash noted.   Cardiovascular: Normal heart rate noted  Respiratory: Normal respiratory effort, no problems with respiration noted  Abdomen: Soft, gravid, appropriate for gestational age.  Pain/Pressure: Present     Pelvic: Cervical exam deferred        Extremities: Normal range of motion.     Mental Status: Normal mood and affect. Normal behavior. Normal judgment and thought content.   Assessment and Plan:  Pregnancy: [id number] at [redacted]w[redacted]d 1. Supervision of high risk pregnancy in first trimester 2. Dichorionic diamniotic twin pregnancy in second trimester - BASA therapy - Anatomy ultrasound is scheduled  - Screened for anemia and electrolyte imbalance  - Discussed importance of increased hydration and snacks every 2-3 hours  - Encouraged support belt and PRN tylenol to assist in back discomfort. May consider flexeril.   3. Anti -Lewis a antibody positive   4. Uterine mass   5. [redacted] weeks gestation of  pregnancy   Preterm labor symptoms and general obstetric precautions including but not limited to vaginal bleeding, contractions, leaking of fluid and fetal movement were reviewed in detail with the patient. Please refer to After Visit Summary for other counseling recommendations.   Return in about 1 month (around 02/05/2023) for [facility], IN-PERSON.  Future Appointments  Date Time Provider Department Center  01/18/2023 12:45 PM [facility] NURSE [facility]  01/18/2023  1:00 PM [facility] US1 [facility]  02/03/2023 11:15 AM Ndulue, Nadene Rubins, MD [facility] None    Corlis Hove, NP

## 2023-01-18 ENCOUNTER — Ambulatory Visit: Payer: 59 | Attending: Obstetrics and Gynecology

## 2023-01-18 ENCOUNTER — Ambulatory Visit: Payer: 59 | Admitting: *Deleted

## 2023-01-18 ENCOUNTER — Encounter: Payer: Self-pay | Admitting: *Deleted

## 2023-01-18 DIAGNOSIS — O30041 Twin pregnancy, dichorionic/diamniotic, first trimester: Secondary | ICD-10-CM | POA: Diagnosis not present

## 2023-01-18 DIAGNOSIS — Z3A19 19 weeks gestation of pregnancy: Secondary | ICD-10-CM

## 2023-01-18 DIAGNOSIS — O30042 Twin pregnancy, dichorionic/diamniotic, second trimester: Secondary | ICD-10-CM

## 2023-01-19 ENCOUNTER — Other Ambulatory Visit: Payer: Self-pay | Admitting: *Deleted

## 2023-01-19 DIAGNOSIS — O30042 Twin pregnancy, dichorionic/diamniotic, second trimester: Secondary | ICD-10-CM

## 2023-02-03 ENCOUNTER — Telehealth (INDEPENDENT_AMBULATORY_CARE_PROVIDER_SITE_OTHER): Payer: 59 | Admitting: Family Medicine

## 2023-02-03 DIAGNOSIS — O30041 Twin pregnancy, dichorionic/diamniotic, first trimester: Secondary | ICD-10-CM

## 2023-02-03 DIAGNOSIS — Z3A21 21 weeks gestation of pregnancy: Secondary | ICD-10-CM

## 2023-02-03 DIAGNOSIS — O0992 Supervision of high risk pregnancy, unspecified, second trimester: Secondary | ICD-10-CM

## 2023-02-03 NOTE — Progress Notes (Signed)
S/w pt for virtual ROB visit. Unable to check BP at home. Pt c/o rectal pressure and nausea not alleviated but Rx sent. Pt can not take Phenergan due to drowsiness. Requesting Rx for muscle relaxer discussed at last visit.

## 2023-02-03 NOTE — Progress Notes (Signed)
Appt did not happen. Patient did not respond to call to log into video visit.

## 2023-02-04 ENCOUNTER — Other Ambulatory Visit: Payer: Self-pay | Admitting: Family Medicine

## 2023-02-04 DIAGNOSIS — N898 Other specified noninflammatory disorders of vagina: Secondary | ICD-10-CM

## 2023-02-04 MED ORDER — MICONAZOLE NITRATE 2 % VA CREA: 1.0000 | TOPICAL_CREAM | Freq: Every day | VAGINAL | 0 refills | Status: AC

## 2023-02-08 ENCOUNTER — Ambulatory Visit (INDEPENDENT_AMBULATORY_CARE_PROVIDER_SITE_OTHER): Payer: 59 | Admitting: Obstetrics and Gynecology

## 2023-02-08 ENCOUNTER — Other Ambulatory Visit (HOSPITAL_COMMUNITY)
Admission: RE | Admit: 2023-02-08 | Discharge: 2023-02-08 | Disposition: A | Discharge status: Home / Self Care | Payer: 59 | Source: Ambulatory Visit | Attending: Family Medicine | Admitting: Family Medicine

## 2023-02-08 VITALS — BP 129/85 | HR 105 | Wt 180.4 lb

## 2023-02-08 DIAGNOSIS — O30041 Twin pregnancy, dichorionic/diamniotic, first trimester: Secondary | ICD-10-CM

## 2023-02-08 DIAGNOSIS — O0992 Supervision of high risk pregnancy, unspecified, second trimester: Secondary | ICD-10-CM

## 2023-02-08 DIAGNOSIS — N898 Other specified noninflammatory disorders of vagina: Secondary | ICD-10-CM

## 2023-02-08 DIAGNOSIS — M79606 Pain in leg, unspecified: Secondary | ICD-10-CM

## 2023-02-08 DIAGNOSIS — R42 Dizziness and giddiness: Secondary | ICD-10-CM

## 2023-02-08 DIAGNOSIS — R768 Other specified abnormal immunological findings in serum: Secondary | ICD-10-CM

## 2023-02-08 DIAGNOSIS — Z3A22 22 weeks gestation of pregnancy: Secondary | ICD-10-CM

## 2023-02-08 DIAGNOSIS — O30042 Twin pregnancy, dichorionic/diamniotic, second trimester: Secondary | ICD-10-CM

## 2023-02-08 NOTE — Progress Notes (Signed)
   PRENATAL VISIT NOTE  Subjective:  [name] is a 32 y.o. [id number] at [redacted]w[redacted]d being seen today for ongoing prenatal care.  She is currently monitored for the following issues for this high-risk pregnancy and has Uterine mass; Dichorionic diamniotic twin pregnancy in first trimester; Supervision of high-risk pregnancy; and Anti -Lewis a antibody positive on their problem list.  Patient reports  dizzy, nauseous, pain shooting down leg, particularly if she lays down . Reports she has not lost consciousness, but sometimes feels like she might pass out. She is drinking 6-7 small bottles of water a day. Contractions: Irritability. Vag. Bleeding: None.  Movement: Present. Denies leaking of fluid.   The following portions of the patient's history were reviewed and updated as appropriate: allergies, current medications, past family history, past medical history, past social history, past surgical history and problem list.   Objective:   Vitals:   02/08/23 1320  BP: 129/85  Pulse: (!) 105  Weight: 81.8 kg    Fetal Status: Fetal Heart Rate (bpm):  (Baby A 142 Baby 150)   Movement: Present     General:  Alert, oriented and cooperative. Patient is in no acute distress.  Skin: Skin is warm and dry. No rash noted.   Cardiovascular: Normal heart rate noted  Respiratory: Normal respiratory effort, no problems with respiration noted  Abdomen: Soft, gravid, appropriate for gestational age.  Pain/Pressure: Absent     Pelvic: Cervical exam deferred        Extremities: Normal range of motion.  Edema: None  Mental Status: Normal mood and affect. Normal behavior. Normal judgment and thought content.   Assessment and Plan:  Pregnancy: [id number] at [redacted]w[redacted]d  1. Dichorionic diamniotic twin pregnancy in first trimester Normal growth on anatomy  2. Supervision of high risk pregnancy in second trimester  3. Anti -Lewis a antibody positive  4. [redacted] weeks gestation of pregnancy Cont baby aspirin Regular  growth scans  5. Pain of lower extremity, unspecified laterality - Ambulatory referral to Physical Therapy  6. Dizziness Encourage significant increase in PO hydration, she is only drinking 6 small bottles of water a day   Preterm labor symptoms and general obstetric precautions including but not limited to vaginal bleeding, contractions, leaking of fluid and fetal movement were reviewed in detail with the patient. Please refer to After Visit Summary for other counseling recommendations.   Return in about 4 weeks (around 03/08/2023) for high OB.  Future Appointments  Date Time Provider Department Center  02/17/2023  3:15 PM [facility] NURSE [facility]  02/17/2023  3:30 PM [facility] US2 [facility]  03/15/2023 10:15 AM [facility] NURSE [facility]  03/15/2023 10:30 AM [facility] US5 [facility]    Conan Bowens, MD

## 2023-02-08 NOTE — Addendum Note (Signed)
Addended by: Sharlyne Pacas on: 02/08/2023 03:47 PM   Modules accepted: Orders

## 2023-02-09 MED ORDER — CLOTRIMAZOLE 1 % VA CREA: 1.0000 | TOPICAL_CREAM | Freq: Every day | VAGINAL | 2 refills | Status: DC

## 2023-02-09 NOTE — Addendum Note (Signed)
Addended by: Leroy Libman on: 02/09/2023 01:52 PM   Modules accepted: Orders

## 2023-02-17 ENCOUNTER — Other Ambulatory Visit: Payer: Self-pay | Admitting: Maternal & Fetal Medicine

## 2023-02-17 ENCOUNTER — Ambulatory Visit: Payer: 59 | Attending: Maternal & Fetal Medicine

## 2023-02-17 ENCOUNTER — Ambulatory Visit: Payer: 59 | Admitting: *Deleted

## 2023-02-17 VITALS — BP 115/73 | HR 91

## 2023-02-17 DIAGNOSIS — Z3A23 23 weeks gestation of pregnancy: Secondary | ICD-10-CM | POA: Diagnosis not present

## 2023-02-17 DIAGNOSIS — O365921 Maternal care for other known or suspected poor fetal growth, second trimester, fetus 1: Secondary | ICD-10-CM | POA: Diagnosis not present

## 2023-02-17 DIAGNOSIS — O0992 Supervision of high risk pregnancy, unspecified, second trimester: Secondary | ICD-10-CM

## 2023-02-17 DIAGNOSIS — O35EXX1 Maternal care for other (suspected) fetal abnormality and damage, fetal genitourinary anomalies, fetus 1: Secondary | ICD-10-CM | POA: Diagnosis not present

## 2023-02-17 DIAGNOSIS — O361920 Maternal care for other isoimmunization, second trimester, not applicable or unspecified: Secondary | ICD-10-CM | POA: Diagnosis not present

## 2023-02-17 DIAGNOSIS — O30042 Twin pregnancy, dichorionic/diamniotic, second trimester: Secondary | ICD-10-CM | POA: Diagnosis present

## 2023-02-18 ENCOUNTER — Other Ambulatory Visit: Payer: Self-pay | Admitting: *Deleted

## 2023-02-18 DIAGNOSIS — O365921 Maternal care for other known or suspected poor fetal growth, second trimester, fetus 1: Secondary | ICD-10-CM

## 2023-02-18 DIAGNOSIS — O30042 Twin pregnancy, dichorionic/diamniotic, second trimester: Secondary | ICD-10-CM

## 2023-02-24 ENCOUNTER — Other Ambulatory Visit: Payer: Self-pay

## 2023-02-24 ENCOUNTER — Ambulatory Visit: Payer: 59 | Attending: Obstetrics and Gynecology

## 2023-02-24 DIAGNOSIS — M79606 Pain in leg, unspecified: Secondary | ICD-10-CM | POA: Insufficient documentation

## 2023-02-24 DIAGNOSIS — M6281 Muscle weakness (generalized): Secondary | ICD-10-CM | POA: Insufficient documentation

## 2023-02-24 DIAGNOSIS — R293 Abnormal posture: Secondary | ICD-10-CM | POA: Insufficient documentation

## 2023-02-24 DIAGNOSIS — M5459 Other low back pain: Secondary | ICD-10-CM | POA: Diagnosis not present

## 2023-02-24 DIAGNOSIS — R262 Difficulty in walking, not elsewhere classified: Secondary | ICD-10-CM | POA: Insufficient documentation

## 2023-02-24 DIAGNOSIS — R252 Cramp and spasm: Secondary | ICD-10-CM | POA: Diagnosis not present

## 2023-02-24 NOTE — Therapy (Signed)
OUTPATIENT PHYSICAL THERAPY THORACOLUMBAR EVALUATION   Patient Name: [name] MRN: [id number] DOB:08/07/1990, 32 y.o., female Today's Date: 02/25/2023  END OF SESSION:  PT End of Session - 02/24/23 0937     Visit Number 1    Date for PT Re-Evaluation 04/21/23    Authorization Type UHC primary/ Medicaid secondary    PT Start Time 0932    PT Stop Time 1015    PT Time Calculation (min) 43 min    Activity Tolerance Patient tolerated treatment well    Behavior During Therapy [facility] for tasks assessed/performed             Past Medical History:  Diagnosis Date   BV (bacterial vaginosis)    Trichomonas    Past Surgical History:  Procedure Laterality Date   DILATION AND CURETTAGE OF UTERUS     Patient Active Problem List   Diagnosis Date Noted   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   Supervision of high-risk pregnancy 11/09/2022   Dichorionic diamniotic twin pregnancy in first trimester 10/18/2022   Uterine mass 08/11/2022    PCP: Warden Fillers, MD   REFERRING PROVIDER: Conan Bowens, MD  REFERRING DIAG: Warden Fillers, MD   Rationale for Evaluation and Treatment: Rehabilitation  THERAPY DIAG:  Other low back pain - Plan: PT plan of care cert/re-cert  Abnormal posture - Plan: PT plan of care cert/re-cert  Cramp and spasm - Plan: PT plan of care cert/re-cert  Difficulty in walking, not elsewhere classified - Plan: PT plan of care cert/re-cert  Muscle weakness (generalized) - Plan: PT plan of care cert/re-cert  ONSET DATE: 02/08/2023  SUBJECTIVE:                                                                                                                                                                                           SUBJECTIVE STATEMENT: Patient reports low back pain x 2 months.  She is [redacted] weeks pregnant with twins.  She works in a lab as a Nutritional therapist.  At home has 2 boys and is very active with them.  She c/o back pain  across the entire back but also right leg pain and numbness into the the entire leg with occasional cramping in the calf area.  She hopes to be able to continue to work and care for her boys at home without having to miss work or have someone help with the boys.  She explains that she has tried to walk to relieve the pain but sometimes, she is unable to even walk at times.    PERTINENT HISTORY:  na  PAIN:  Are you having pain? Yes: NPRS scale: 7/10 Pain location: low back and right leg Pain description: aching Aggravating factors: standing Relieving factors: ice heat  PRECAUTIONS: None  RED FLAGS: None   WEIGHT BEARING RESTRICTIONS: No  FALLS:  Has patient fallen in last 6 months? No  LIVING ENVIRONMENT: Lives with: lives with their family Lives in: House/apartment  OCCUPATION: lab tech  PLOF: Independent, Independent with basic ADLs, Independent with household mobility without device, Independent with community mobility without device, Independent with homemaking with ambulation, Independent with gait, and Independent with transfers  PATIENT GOALS: She hopes to be able to continue to work and care for her boys at home without having to miss work or have someone help with the boys.   NEXT MD VISIT: na  OBJECTIVE:   DIAGNOSTIC FINDINGS:  na  PATIENT SURVEYS:  Modified Oswestry 16/50= 32%   SCREENING FOR RED FLAGS: Bowel or bladder incontinence: No Spinal tumors: No Cauda equina syndrome: No Compression fracture: No Abdominal aneurysm: No  COGNITION: Overall cognitive status: Within functional limits for tasks assessed     SENSATION: WFL  MUSCLE LENGTH: Hamstrings: Right 50 deg; Left 55 deg Thomas test: Right pos; Left pos  POSTURE: increased lumbar lordosis  PALPATION: Tender to palpation on right > left low back area  LUMBAR ROM:   WNL including flexion (fingertips to toes)  LOWER EXTREMITY ROM:     WNL  LOWER EXTREMITY MMT:    All 5/5 with  exception of bilateral hip adduction 4-/5 , hip extension and abduction generally 4/5.  (Patient had pain with proximal hip testing)  LUMBAR SPECIAL TESTS:  Straight leg raise test: Negative  FUNCTIONAL TESTS:  5 times sit to stand: 14.97 sec Timed up and go (TUG): 11.57 sec  GAIT: Distance walked: 50 Assistive device utilized: None Level of assistance: Complete Independence Comments: slow antalgic  TODAY'S TREATMENT:                                                                                                                              DATE: 02/24/23 Initial eval completed and initiated HEP    PATIENT EDUCATION:  Education details: Initiated HEP Person educated: Patient Education method: Programmer, multimedia, Facilities manager, Verbal cues, and Handouts Education comprehension: verbalized understanding, returned demonstration, and verbal cues required  HOME EXERCISE PROGRAM: Access Code: [email or web address] URL: [email or web address] Date: 02/24/2023 Prepared by: Mikey Kirschner  Exercises - Standing Hamstring Stretch on Chair  - 1 x daily - 7 x weekly - 1 sets - 3 reps - 30 sec hold - Quadricep Stretch with Chair and Counter Support  - 1 x daily - 7 x weekly - 1 sets - 3 reps - 30 sec hold - Seated Piriformis Stretch with Trunk Bend  - 1 x daily - 7 x weekly - 1 sets - 3 reps - 30 sec hold  ASSESSMENT:  CLINICAL IMPRESSION: Patient is a 32 y.o. female who was seen today for  physical therapy evaluation and treatment for low back with with radiculpathy.   She presents with good lumbar ROM, weakness in proximal hips and elevated pain in lumbar spine.  She is [redacted] weeks gestation with twins.  She would respond well to skilled PT for LE flexibility, TA strengthening and core stabilization.    OBJECTIVE IMPAIRMENTS: decreased knowledge of condition, difficulty walking, decreased ROM, decreased strength, and decreased safety awareness.   ACTIVITY LIMITATIONS: carrying, lifting, bending,  standing, squatting, sleeping, stairs, transfers, bed mobility, bathing, toileting, dressing, reach over head, and hygiene/grooming  PARTICIPATION LIMITATIONS: meal prep, cleaning, laundry, driving, shopping, community activity, occupation, yard work, and school  PERSONAL FACTORS: 1 comorbidity: pregnancy  are also affecting patient's functional outcome.   REHAB POTENTIAL: Good  CLINICAL DECISION MAKING: Stable/uncomplicated  EVALUATION COMPLEXITY: Low   GOALS: Goals reviewed with patient? Yes  SHORT TERM GOALS: Target date: 03/25/2023   Pain report to be no greater than 4/10  Baseline: Goal status: INITIAL  2.  Patient will be independent with initial HEP  Baseline:  Goal status: INITIAL   LONG TERM GOALS: Target date: 04/22/2023   Patient to report pain no greater than 2/10  Baseline:  Goal status: INITIAL  2.  Patient to be independent with advanced HEP  Baseline:  Goal status: INITIAL  3.  Patient to be able to stand or walk for at least 15 min without leg pain  Baseline:  Goal status: INITIAL  4.  Radicular symptoms to be centralized Baseline:  Goal status: INITIAL  5.  Patient to report 85% improvement in overall symptoms  Baseline:  Goal status: INITIAL  6.  Patient to be able to continue working Baseline:  Goal status: INITIAL  PLAN:  PT FREQUENCY: 1-2x/week  PT DURATION: 8 weeks  PLANNED INTERVENTIONS: Therapeutic exercises, Therapeutic activity, Neuromuscular re-education, Balance training, Gait training, Patient/Family education, Self Care, Joint mobilization, Stair training, Aquatic Therapy, Dry Needling, Cryotherapy, Moist heat, Manual therapy, and Re-evaluation.  PLAN FOR NEXT SESSION: Review HEP, Nustep, pelvic floor and core stabilization   Vernell Barrier, PT 02/25/2023, 7:31 AM

## 2023-03-01 ENCOUNTER — Ambulatory Visit: Payer: 59 | Attending: Obstetrics and Gynecology

## 2023-03-01 DIAGNOSIS — R262 Difficulty in walking, not elsewhere classified: Secondary | ICD-10-CM | POA: Insufficient documentation

## 2023-03-01 DIAGNOSIS — R252 Cramp and spasm: Secondary | ICD-10-CM | POA: Insufficient documentation

## 2023-03-01 DIAGNOSIS — M6281 Muscle weakness (generalized): Secondary | ICD-10-CM | POA: Insufficient documentation

## 2023-03-01 DIAGNOSIS — M5459 Other low back pain: Secondary | ICD-10-CM | POA: Diagnosis present

## 2023-03-01 DIAGNOSIS — R293 Abnormal posture: Secondary | ICD-10-CM | POA: Diagnosis present

## 2023-03-01 NOTE — Therapy (Signed)
OUTPATIENT PHYSICAL THERAPY THORACOLUMBAR EVALUATION   Patient Name: [name] MRN: [id number] DOB:[birth year], 32 y.o., female Today's Date: 03/01/2023  END OF SESSION:  PT End of Session - 03/01/23 0941     Visit Number 2    Date for PT Re-Evaluation 04/21/23    Authorization Type UHC primary/ Medicaid secondary    PT Start Time [phone]    Activity Tolerance Patient tolerated treatment well    Behavior During Therapy [employer] [facility] for tasks assessed/performed             Past Medical History:  Diagnosis Date   BV (bacterial vaginosis)    Trichomonas    Past Surgical History:  Procedure Laterality Date   DILATION AND CURETTAGE OF UTERUS     Patient Active Problem List   Diagnosis Date Noted   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   Supervision of high-risk pregnancy 11/09/2022   Dichorionic diamniotic twin pregnancy in first trimester 10/18/2022   Uterine mass 08/11/2022    PCP: Warden Fillers, MD   REFERRING PROVIDER: Conan Bowens, MD  REFERRING DIAG: Warden Fillers, MD   Rationale for Evaluation and Treatment: Rehabilitation  THERAPY DIAG:  Other low back pain  Abnormal posture  Cramp and spasm  Difficulty in walking, not elsewhere classified  Muscle weakness (generalized)  ONSET DATE: 02/08/2023  SUBJECTIVE:                                                                                                                                                                                           SUBJECTIVE STATEMENT: Patient states she was able to walk "a little more".  But now my left leg is also going numb but this only happens when I am lying down.  Pain reported at 7/10.    PERTINENT HISTORY:  na  PAIN:  Are you having pain? Yes: NPRS scale: 7/10 Pain location: low back and right leg Pain description: aching Aggravating factors: standing Relieving factors: ice heat  PRECAUTIONS: None  RED FLAGS: None   WEIGHT BEARING RESTRICTIONS:  No  FALLS:  Has patient fallen in last 6 months? No  LIVING ENVIRONMENT: Lives with: lives with their family Lives in: House/apartment  OCCUPATION: lab tech  PLOF: Independent, Independent with basic ADLs, Independent with household mobility without device, Independent with community mobility without device, Independent with homemaking with ambulation, Independent with gait, and Independent with transfers  PATIENT GOALS: She hopes to be able to continue to work and care for her boys at home without having to miss work or have someone help with the boys.   NEXT MD VISIT: na  OBJECTIVE:   DIAGNOSTIC FINDINGS:  na  PATIENT SURVEYS:  Modified Oswestry 16/50= 32%   SCREENING FOR RED FLAGS: Bowel or bladder incontinence: No Spinal tumors: No Cauda equina syndrome: No Compression fracture: No Abdominal aneurysm: No  COGNITION: Overall cognitive status: Within functional limits for tasks assessed     SENSATION: WFL  MUSCLE LENGTH: Hamstrings: Right 50 deg; Left 55 deg Thomas test: Right pos; Left pos  POSTURE: increased lumbar lordosis  PALPATION: Tender to palpation on right > left low back area  LUMBAR ROM:   WNL including flexion (fingertips to toes)  LOWER EXTREMITY ROM:     WNL  LOWER EXTREMITY MMT:    All 5/5 with exception of bilateral hip adduction 4-/5 , hip extension and abduction generally 4/5.  (Patient had pain with proximal hip testing)  LUMBAR SPECIAL TESTS:  Straight leg raise test: Negative  FUNCTIONAL TESTS:  5 times sit to stand: 14.97 sec Timed up and go (TUG): 11.57 sec  GAIT: Distance walked: 50 Assistive device utilized: None Level of assistance: Complete Independence Comments: slow antalgic  TODAY'S TREATMENT:                                                                                                                              DATE: 03/01/23 Nustep x 5 min level 2 Standing hamstring stretch 3 x 30 sec Standing quad/hip  flexor stretch 3 x 30 sec Seated piriformis stretch 3 x 30 sec  Hooklying TA contraction x 20 Hooklying TA with mini isolated heel taps  Hooklying TA with mini heel taps in and out Bridging x 10 slow hold x 2-3 seconds in bridge  DATE: 02/24/23 Initial eval completed and initiated HEP    PATIENT EDUCATION:  Education details: Initiated HEP Person educated: Patient Education method: Programmer, multimedia, Facilities manager, Verbal cues, and Handouts Education comprehension: verbalized understanding, returned demonstration, and verbal cues required  HOME EXERCISE PROGRAM: Access Code: [email or web address] URL: [email or web address] Date: 02/24/2023 Prepared by: Mikey Kirschner  Exercises - Standing Hamstring Stretch on Chair  - 1 x daily - 7 x weekly - 1 sets - 3 reps - 30 sec hold - Quadricep Stretch with Chair and Counter Support  - 1 x daily - 7 x weekly - 1 sets - 3 reps - 30 sec hold - Seated Piriformis Stretch with Trunk Bend  - 1 x daily - 7 x weekly - 1 sets - 3 reps - 30 sec hold  ASSESSMENT:  CLINICAL IMPRESSION: Stephonie was able to complete all tasks but babies were moving a lot so she had to rest frequently to allow them to settle.  She seemed to have good TA control throughout. She reported relief of pubic symphysis pain post treatment.    She would respond well to continued skilled PT for LE flexibility, TA strengthening and core stabilization.    OBJECTIVE IMPAIRMENTS: decreased knowledge of condition, difficulty walking, decreased ROM, decreased strength, and decreased safety awareness.   ACTIVITY  LIMITATIONS: carrying, lifting, bending, standing, squatting, sleeping, stairs, transfers, bed mobility, bathing, toileting, dressing, reach over head, and hygiene/grooming  PARTICIPATION LIMITATIONS: meal prep, cleaning, laundry, driving, shopping, community activity, occupation, yard work, and school  PERSONAL FACTORS: 1 comorbidity: pregnancy  are also affecting patient's functional  outcome.   REHAB POTENTIAL: Good  CLINICAL DECISION MAKING: Stable/uncomplicated  EVALUATION COMPLEXITY: Low   GOALS: Goals reviewed with patient? Yes  SHORT TERM GOALS: Target date: 03/25/2023   Pain report to be no greater than 4/10  Baseline: Goal status: INITIAL  2.  Patient will be independent with initial HEP  Baseline:  Goal status: INITIAL   LONG TERM GOALS: Target date: 04/22/2023   Patient to report pain no greater than 2/10  Baseline:  Goal status: INITIAL  2.  Patient to be independent with advanced HEP  Baseline:  Goal status: INITIAL  3.  Patient to be able to stand or walk for at least 15 min without leg pain  Baseline:  Goal status: INITIAL  4.  Radicular symptoms to be centralized Baseline:  Goal status: INITIAL  5.  Patient to report 85% improvement in overall symptoms  Baseline:  Goal status: INITIAL  6.  Patient to be able to continue working Baseline:  Goal status: INITIAL  PLAN:  PT FREQUENCY: 1-2x/week  PT DURATION: 8 weeks  PLANNED INTERVENTIONS: Therapeutic exercises, Therapeutic activity, Neuromuscular re-education, Balance training, Gait training, Patient/Family education, Self Care, Joint mobilization, Stair training, Aquatic Therapy, Dry Needling, Cryotherapy, Moist heat, Manual therapy, and Re-evaluation.  PLAN FOR NEXT SESSION: Nustep, flexibility exercises, pelvic floor/TA and core stabilization   Corky Blumstein B. Quintavious Rinck, PT 03/01/23 10:11 AM [facility] [address], [address] [place], [place] [address] Phone # [phone] Fax [phone]

## 2023-03-04 ENCOUNTER — Ambulatory Visit: Payer: 59 | Attending: Obstetrics and Gynecology

## 2023-03-04 ENCOUNTER — Ambulatory Visit: Payer: 59 | Admitting: *Deleted

## 2023-03-04 VITALS — BP 102/58 | HR 88

## 2023-03-04 DIAGNOSIS — O0992 Supervision of high risk pregnancy, unspecified, second trimester: Secondary | ICD-10-CM | POA: Insufficient documentation

## 2023-03-04 DIAGNOSIS — O365921 Maternal care for other known or suspected poor fetal growth, second trimester, fetus 1: Secondary | ICD-10-CM | POA: Insufficient documentation

## 2023-03-04 DIAGNOSIS — O285 Abnormal chromosomal and genetic finding on antenatal screening of mother: Secondary | ICD-10-CM

## 2023-03-04 DIAGNOSIS — Z3A25 25 weeks gestation of pregnancy: Secondary | ICD-10-CM

## 2023-03-04 DIAGNOSIS — O35EXX1 Maternal care for other (suspected) fetal abnormality and damage, fetal genitourinary anomalies, fetus 1: Secondary | ICD-10-CM

## 2023-03-04 DIAGNOSIS — O30042 Twin pregnancy, dichorionic/diamniotic, second trimester: Secondary | ICD-10-CM | POA: Diagnosis present

## 2023-03-07 ENCOUNTER — Inpatient Hospital Stay (HOSPITAL_COMMUNITY)
Admission: AD | Admit: 2023-03-07 | Discharge: 2023-03-08 | Disposition: A | Discharge status: Home / Self Care | Payer: 59 | Attending: Obstetrics and Gynecology | Admitting: Obstetrics and Gynecology

## 2023-03-07 ENCOUNTER — Inpatient Hospital Stay (HOSPITAL_COMMUNITY): Payer: 59

## 2023-03-07 ENCOUNTER — Encounter (HOSPITAL_COMMUNITY): Payer: Self-pay | Admitting: Obstetrics and Gynecology

## 2023-03-07 DIAGNOSIS — O0992 Supervision of high risk pregnancy, unspecified, second trimester: Secondary | ICD-10-CM | POA: Diagnosis not present

## 2023-03-07 DIAGNOSIS — O30042 Twin pregnancy, dichorionic/diamniotic, second trimester: Secondary | ICD-10-CM | POA: Insufficient documentation

## 2023-03-07 DIAGNOSIS — O365921 Maternal care for other known or suspected poor fetal growth, second trimester, fetus 1: Secondary | ICD-10-CM

## 2023-03-07 DIAGNOSIS — W19XXXA Unspecified fall, initial encounter: Secondary | ICD-10-CM | POA: Diagnosis not present

## 2023-03-07 DIAGNOSIS — R42 Dizziness and giddiness: Secondary | ICD-10-CM | POA: Diagnosis not present

## 2023-03-07 DIAGNOSIS — O26892 Other specified pregnancy related conditions, second trimester: Secondary | ICD-10-CM | POA: Insufficient documentation

## 2023-03-07 DIAGNOSIS — Z3A26 26 weeks gestation of pregnancy: Secondary | ICD-10-CM | POA: Diagnosis not present

## 2023-03-07 DIAGNOSIS — X58XXXA Exposure to other specified factors, initial encounter: Secondary | ICD-10-CM | POA: Diagnosis not present

## 2023-03-07 DIAGNOSIS — O9A212 Injury, poisoning and certain other consequences of external causes complicating pregnancy, second trimester: Secondary | ICD-10-CM

## 2023-03-07 DIAGNOSIS — R102 Pelvic and perineal pain: Secondary | ICD-10-CM | POA: Insufficient documentation

## 2023-03-07 DIAGNOSIS — O280 Abnormal hematological finding on antenatal screening of mother: Secondary | ICD-10-CM

## 2023-03-07 DIAGNOSIS — R1084 Generalized abdominal pain: Secondary | ICD-10-CM | POA: Diagnosis not present

## 2023-03-07 DIAGNOSIS — R55 Syncope and collapse: Secondary | ICD-10-CM | POA: Diagnosis not present

## 2023-03-07 DIAGNOSIS — W1839XA Other fall on same level, initial encounter: Secondary | ICD-10-CM

## 2023-03-07 DIAGNOSIS — O35EXX1 Maternal care for other (suspected) fetal abnormality and damage, fetal genitourinary anomalies, fetus 1: Secondary | ICD-10-CM

## 2023-03-07 DIAGNOSIS — S0990XA Unspecified injury of head, initial encounter: Secondary | ICD-10-CM | POA: Insufficient documentation

## 2023-03-07 LAB — URINALYSIS, ROUTINE W REFLEX MICROSCOPIC
Bacteria, UA: NONE SEEN
Bilirubin Urine: NEGATIVE
Glucose, UA: NEGATIVE mg/dL
Hgb urine dipstick: NEGATIVE
Ketones, ur: NEGATIVE mg/dL
Leukocytes,Ua: NEGATIVE
Nitrite: NEGATIVE
Protein, ur: NEGATIVE mg/dL
Specific Gravity, Urine: 1.009 (ref 1.005–1.030)
pH: 6 (ref 5.0–8.0)

## 2023-03-07 LAB — CBC
HCT: 28.5 % — ABNORMAL LOW (ref 36.0–46.0)
Hemoglobin: 9.7 g/dL — ABNORMAL LOW (ref 12.0–15.0)
MCH: 33.2 pg (ref 26.0–34.0)
MCHC: 34 g/dL (ref 30.0–36.0)
MCV: 97.6 fL (ref 80.0–100.0)
Platelets: 311 10*3/uL (ref 150–400)
RBC: 2.92 MIL/uL — ABNORMAL LOW (ref 3.87–5.11)
RDW: 12.2 % (ref 11.5–15.5)
WBC: 15.2 10*3/uL — ABNORMAL HIGH (ref 4.0–10.5)
nRBC: 0 % (ref 0.0–0.2)

## 2023-03-07 LAB — COMPREHENSIVE METABOLIC PANEL
ALT: 16 U/L (ref 0–44)
AST: 20 U/L (ref 15–41)
Albumin: 2.7 g/dL — ABNORMAL LOW (ref 3.5–5.0)
Alkaline Phosphatase: 79 U/L (ref 38–126)
Anion gap: 7 (ref 5–15)
BUN: 5 mg/dL — ABNORMAL LOW (ref 6–20)
CO2: 18 mmol/L — ABNORMAL LOW (ref 22–32)
Calcium: 8.7 mg/dL — ABNORMAL LOW (ref 8.9–10.3)
Chloride: 105 mmol/L (ref 98–111)
Creatinine, Ser: 0.5 mg/dL (ref 0.44–1.00)
GFR, Estimated: >60 mL/min (ref >60)
Glucose, Bld: 104 mg/dL — ABNORMAL HIGH (ref 70–99)
Potassium: 3.6 mmol/L (ref 3.5–5.1)
Sodium: 130 mmol/L — ABNORMAL LOW (ref 135–145)
Total Bilirubin: 0.7 mg/dL (ref 0.3–1.2)
Total Protein: 6.8 g/dL (ref 6.5–8.1)

## 2023-03-07 MED ORDER — LACTATED RINGERS IV BOLUS
1000.0000 mL | Freq: Once | INTRAVENOUS | Status: AC
  Administered 2023-03-07: 1000 mL via INTRAVENOUS

## 2023-03-07 MED ORDER — HYDROMORPHONE HCL 1 MG/ML IJ SOLN
1.0000 mg | Freq: Once | INTRAMUSCULAR | Status: AC
  Administered 2023-03-07: 1 mg via INTRAVENOUS
  Filled 2023-03-07: qty 1

## 2023-03-07 MED ORDER — CYCLOBENZAPRINE HCL 5 MG PO TABS
10.0000 mg | ORAL_TABLET | Freq: Once | ORAL | Status: AC
  Administered 2023-03-07: 10 mg via ORAL
  Filled 2023-03-07: qty 2

## 2023-03-07 MED ORDER — ACETAMINOPHEN 500 MG PO TABS
1000.0000 mg | ORAL_TABLET | Freq: Once | ORAL | Status: AC
  Administered 2023-03-07: 1000 mg via ORAL
  Filled 2023-03-07: qty 2

## 2023-03-07 MED ORDER — CYCLOBENZAPRINE HCL 10 MG PO TABS
10.0000 mg | ORAL_TABLET | Freq: Two times a day (BID) | ORAL | 0 refills | Status: DC | PRN
Start: 2023-03-07 — End: 2023-05-04

## 2023-03-07 NOTE — MAU Note (Signed)
RN called CT. CT stated they sent for transportation and they should be here soon to transport her.

## 2023-03-07 NOTE — MAU Provider Note (Incomplete Revision)
MAU Provider Note  History  [id number]  Arrival date and time: 03/07/23 1804   Chief Complaint  Patient presents with   Loss of Consciousness   Abdominal Pain     HPI Kanasia Oleksa Tollefsen is a 32 y.o. [id number] at [redacted]w[redacted]d by ultrasound with PMHx notable for high-risk pregnancy, uterine mass with dichorionic diamniotic twin pregnancy, who presents for syncope. Was standing in line at the store when felt light-headed, flushed, and saw black spots. She then lost consciousness and bystanders reported that she fell backwards and hit her head. She had some shaking while unconscious. She is unsure how long she was unconscious for. She felt fatigued and drowsy when she regained consciousness. She now has a 7/10 headache over her posterior head. She also notes new pelvic pain that feels sharp and stabbing with movent that started after she regained consciousness.   She denies chest pain, shortness of breath, current vision changes, numbness or tingling, and nausea.   Vaginal bleeding: No LOF: No Fetal Movement: Yes Contractions: No  A/Positive/-- (05/14 1407)  OB History  Gravida Para Term Preterm AB Living  7 2 2  0 4 2  SAB IAB Ectopic Multiple Live Births  3 1 0 0 2    # Outcome Date GA Lbr Len/2nd Weight Sex Type Anes PTL Lv  7 Current           6 IAB 2022     TAB     5 Term 09/05/16 [redacted]w[redacted]d 10:07 / 00:24 3055 g M Vag-Spont EPI  LIV  4 Term 12/06/13 [redacted]w[redacted]d 10:43 / 00:44 3181 g M Vag-Spont Local, EPI  LIV  3 SAB 06/28/09 103w0d         2 SAB           1 SAB              Past Medical History:  Diagnosis Date   BV (bacterial vaginosis)    Trichomonas     Past Surgical History:  Procedure Laterality Date   DILATION AND CURETTAGE OF UTERUS      Family History  Problem Relation Age of Onset   Kidney Stones Mother    Healthy Father    Cancer Brother    Diabetes Brother    Hypertension Maternal Grandfather    Heart disease Neg Hx     Social History   Socioeconomic History    Marital status: Single    Spouse name: Not on file   Number of children: Not on file   Years of education: Not on file   Highest education level: Not on file  Occupational History   Not on file  Tobacco Use   Smoking status: Former    Current packs/day: 0.00    Types: Cigarettes    Quit date: 04/02/2013    Years since quitting: 9.9   Smokeless tobacco: Never  Vaping Use   Vaping status: Never Used  Substance and Sexual Activity   Alcohol use: Not Currently    Comment: occasionally before pregnancy   Drug use: No   Sexual activity: Yes    Partners: Male    Birth control/protection: None    Comment: currently pregnant  Other Topics Concern   Not on file  Social History Narrative   Not on file   Social Determinants of Health   Financial Resource Strain: Not on file  Food Insecurity: Not on file  Transportation Needs: Not on file  Physical Activity: Not on file  Stress: Not  on file  Social Connections: Not on file  Intimate Partner Violence: Not on file    Allergies  Allergen Reactions   Latex Rash   Sulfa Antibiotics Hives    No current facility-administered medications on file prior to encounter.   Current Outpatient Medications on File Prior to Encounter  Medication Sig Dispense Refill   aspirin 81 MG chewable tablet Chew 1 tablet (81 mg total) by mouth daily. Start at 15 weeks 30 tablet 5   Prenatal MV & Min w/FA-DHA (PRENATAL GUMMIES PO) Take 2 tablets by mouth daily.     promethazine (PHENERGAN) 25 MG tablet Take 1 tablet (25 mg total) by mouth every 6 (six) hours as needed for nausea or vomiting. 30 tablet 0   atomoxetine (STRATTERA) 40 MG capsule Take 1 tablet by mouth daily. (Patient not taking: Reported on 11/09/2022)     Blood Pressure Monitoring (BLOOD PRESSURE KIT) DEVI 1 kit by Does not apply route once a week. (Patient not taking: Reported on 11/30/2022) 1 each 0   clotrimazole (GYNE-LOTRIMIN) 1 % vaginal cream Place 1 Applicatorful vaginally at bedtime.  (Patient not taking: Reported on 03/07/2023) 30 g 2   Doxylamine-Pyridoxine (DICLEGIS) 10-10 MG TBEC Take 2 tablets by mouth at bedtime. If symptoms persist, add one tablet in the morning and one in the afternoon (Patient not taking: Reported on 02/08/2023) 100 tablet 5   Misc. Devices (GOJJI WEIGHT SCALE) MISC 1 Device by Does not apply route every 30 (thirty) days. (Patient not taking: Reported on 11/30/2022) 1 each 0   ondansetron (ZOFRAN-ODT) 4 MG disintegrating tablet Take 1 tablet (4 mg total) by mouth every 6 (six) hours as needed for nausea. (Patient not taking: Reported on 03/07/2023) 60 tablet 1   scopolamine (TRANSDERM-SCOP) 1 MG/3DAYS Place 1 patch (1.5 mg total) onto the skin every 3 (three) days. (Patient not taking: Reported on 02/08/2023) 10 patch 1    ROS: Pertinent positives and negative per HPI, all others reviewed and negative  Physical Exam   BP 119/73   Pulse 96   Resp 18   LMP 09/06/2022   SpO2 99%   Patient Vitals for the past 24 hrs:  BP Pulse Resp SpO2  03/07/23 2033 119/73 96 -- --  03/07/23 2025 97/80 (!) 105 -- 99 %  03/07/23 1818 125/72 (!) 108 18 --    Physical Exam Vitals and nursing note reviewed.  Constitutional:      General: She is in acute distress.  HENT:     Head: Normocephalic. No abrasion, masses or laceration.     Comments: Pain to palpation over the occiput.  Eyes:     Extraocular Movements: Extraocular movements intact.  Cardiovascular:     Rate and Rhythm: Regular rhythm.     Pulses: Normal pulses.     Heart sounds: Normal heart sounds.  Pulmonary:     Effort: Pulmonary effort is normal.     Breath sounds: Normal breath sounds.  Abdominal:     Comments: Lower abdomen tender to palpation. No masses on palpation.  Musculoskeletal:     Cervical back: Normal range of motion.  Skin:    General: Skin is warm and dry.     Capillary Refill: Capillary refill takes less than 2 seconds.  Neurological:     Mental Status: She is alert and  oriented to person, place, and time.     Comments: No numbness or tingling.      Cervical Exam  Not done  Bedside Ultrasound  not done  FHT Baby A:150bpm/Moderate variability/ 10x10 accels/ None decels CAT: 1 Baby B:160bpm/Moderate variability/ 10x10 accels/ None decels CAT: 1 Toco: none   Labs No results found for this or any previous visit (from the past 24 hour(s)).  Imaging No results found.  MAU Course  MDM: high  This patient presents to the ED for concern of   Chief Complaint  Patient presents with   Loss of Consciousness   Abdominal Pain     These complains involves an extensive number of treatment options, and is a complaint that carries with it a high risk of complications and morbidity.  The differential diagnosis for  1. Syncope INCLUDES most likely vasovagal syncope or orthostatic syncope, less likely cardiac syncope or psychogenic.   2. Abdominal pain INCLUDES most likely round ligament pain, cannot rule out placental abruption, or rupture of uterine mass at this time.   Co morbidities that complicate the patient evaluation:  Additional history obtained from mother  Interpreter services used: no  External records from outside source obtained and reviewed including Prenatal care records  Lab Tests: UA, CMP, CBC, and Other orthostatic vitals    I ordered, and personally interpreted labs.  The pertinent results include: Orthostatic vitals showed a decrease in systolic BP of 20 mmHg and an increase in heart rate at 1 minute standing. Remainder of labs pending  Imaging Studies ordered:  I ordered imaging studies including CT Head and abdominal ultrasound. Currently pending.   Cardiac Testing/Monitoring:  EKG was ordered today. I personally reviewed or consulted with a physician to help with intrepretation. EKG showed tachycardia with 1mm ST elevation in V1 and V2. May be artifact caused by patient movement.  Will repeat EKG.  Medicines ordered and  prescription drug management:  Medications: Tylenol and Flexeril for pain management.   I have reviewed the patients home medicines and have made adjustments as needed.  Critical Interventions: None  Consultations Obtained: None    MAU Course:  Dispostion: Observing in MAU while awaiting imaging and lab results    Assessment and Plan  1. Supervision of high risk pregnancy in second trimester 2. Syncope  Orthostatic BP showed a decrease in systolic BP of 20 mmHg with increased HR, consistent with orthostatic hypotension. Given history of dizziness due to inadequate fluid intake during this pregnancy and flushing/visual changes prior to LOC with normal cardiac exam, concern for cardiac cause decreased. Will follow up on repeat EKG for true elevation or artifact from patient movement. Will follow up CMP and CBC for possible metabolic or hematologic etiologies.  3. Head injury  Reassured against intracranial hemorrhage at this time given normal neuro exam. CT ordered for confirmation.  4 Abdominal pain Abdominal pain description most consistent with round ligament pain. Will also get abdominal ultrasound to rule out placental abruption or rupture of uterine mass.    Will continue to monitor while pending lab and imaging results at this time and start IV LR for hydration.       Allergies as of 03/07/2023       Reactions   Latex Rash   Sulfa Antibiotics Hives       Ezekiel Slocumb, Medical Student [employer] [facility] [employer] [facility] 03/07/23  8:42 PM  - Assumed Care @ 2000. Assessment at 9:15. Patient A& O x4. She passed Orthostatics. Awaiting Head CT and Abdominal US for status of twins.  - Upon reassessemnt pain atill 9/10 for headhace and back pain. IV dilaudid ordered.

## 2023-03-07 NOTE — MAU Provider Note (Cosign Needed)
MAU Provider Note  History  [id number]  Arrival date and time: 03/07/23 1804   Chief Complaint  Patient presents with   Loss of Consciousness   Abdominal Pain     HPI [name] is a 32 y.o. [id number] at [redacted]w[redacted]d by ultrasound with PMHx notable for high-risk pregnancy, uterine mass with dichorionic diamniotic twin pregnancy, who presents for syncope. Was standing in line at the store when felt light-headed, flushed, and saw black spots. She then lost consciousness and bystanders reported that she fell backwards and hit her head. She had some shaking while unconscious. She is unsure how long she was unconscious for. She felt fatigued and drowsy when she regained consciousness. She now has a 7/10 headache over her posterior head. She also notes new pelvic pain that feels sharp and stabbing with movent that started after she regained consciousness.   She denies chest pain, shortness of breath, current vision changes, numbness or tingling, and nausea.   Vaginal bleeding: No LOF: No Fetal Movement: Yes Contractions: No  A/Positive/-- (05/14 1407)  OB History  Gravida Para Term Preterm AB Living  7 2 2  0 4 2  SAB IAB Ectopic Multiple Live Births  3 1 0 0 2    # Outcome Date GA Lbr Len/2nd Weight Sex Type Anes PTL Lv  7 Current           6 IAB 2022     TAB     5 Term 09/05/16 [redacted]w[redacted]d 10:07 / 00:24 3055 g M Vag-Spont EPI  LIV  4 Term 12/06/13 [redacted]w[redacted]d 10:43 / 00:44 3181 g M Vag-Spont Local, EPI  LIV  3 SAB 06/28/09 [redacted]w[redacted]d         2 SAB           1 SAB              Past Medical History:  Diagnosis Date   BV (bacterial vaginosis)    Trichomonas     Past Surgical History:  Procedure Laterality Date   DILATION AND CURETTAGE OF UTERUS      Family History  Problem Relation Age of Onset   Kidney Stones Mother    Healthy Father    Cancer Brother    Diabetes Brother    Hypertension Maternal Grandfather    Heart disease Neg Hx     Social History   Socioeconomic History    Marital status: Single    Spouse name: Not on file   Number of children: Not on file   Years of education: Not on file   Highest education level: Not on file  Occupational History   Not on file  Tobacco Use   Smoking status: Former    Current packs/day: 0.00    Types: Cigarettes    Quit date: 04/02/2013    Years since quitting: 9.9   Smokeless tobacco: Never  Vaping Use   Vaping status: Never Used  Substance and Sexual Activity   Alcohol use: Not Currently    Comment: occasionally before pregnancy   Drug use: No   Sexual activity: Yes    Partners: Male    Birth control/protection: None    Comment: currently pregnant  Other Topics Concern   Not on file  Social History Narrative   Not on file   Social Determinants of Health   Financial Resource Strain: Not on file  Food Insecurity: Not on file  Transportation Needs: Not on file  Physical Activity: Not on file  Stress: Not  on file  Social Connections: Not on file  Intimate Partner Violence: Not on file    Allergies  Allergen Reactions   Latex Rash   Sulfa Antibiotics Hives    No current facility-administered medications on file prior to encounter.   Current Outpatient Medications on File Prior to Encounter  Medication Sig Dispense Refill   aspirin 81 MG chewable tablet Chew 1 tablet (81 mg total) by mouth daily. Start at 15 weeks 30 tablet 5   Prenatal MV & Min w/FA-DHA (PRENATAL GUMMIES PO) Take 2 tablets by mouth daily.     promethazine (PHENERGAN) 25 MG tablet Take 1 tablet (25 mg total) by mouth every 6 (six) hours as needed for nausea or vomiting. 30 tablet 0   atomoxetine (STRATTERA) 40 MG capsule Take 1 tablet by mouth daily. (Patient not taking: Reported on 11/09/2022)     Blood Pressure Monitoring (BLOOD PRESSURE KIT) DEVI 1 kit by Does not apply route once a week. (Patient not taking: Reported on 11/30/2022) 1 each 0   clotrimazole (GYNE-LOTRIMIN) 1 % vaginal cream Place 1 Applicatorful vaginally at bedtime.  (Patient not taking: Reported on 03/07/2023) 30 g 2   Doxylamine-Pyridoxine (DICLEGIS) 10-10 MG TBEC Take 2 tablets by mouth at bedtime. If symptoms persist, add one tablet in the morning and one in the afternoon (Patient not taking: Reported on 02/08/2023) 100 tablet 5   Misc. Devices (GOJJI WEIGHT SCALE) MISC 1 Device by Does not apply route every 30 (thirty) days. (Patient not taking: Reported on 11/30/2022) 1 each 0   ondansetron (ZOFRAN-ODT) 4 MG disintegrating tablet Take 1 tablet (4 mg total) by mouth every 6 (six) hours as needed for nausea. (Patient not taking: Reported on 03/07/2023) 60 tablet 1   scopolamine (TRANSDERM-SCOP) 1 MG/3DAYS Place 1 patch (1.5 mg total) onto the skin every 3 (three) days. (Patient not taking: Reported on 02/08/2023) 10 patch 1    ROS: Pertinent positives and negative per HPI, all others reviewed and negative  Physical Exam   BP 119/73   Pulse 96   Resp 18   LMP 09/06/2022   SpO2 99%   Patient Vitals for the past 24 hrs:  BP Pulse Resp SpO2  03/07/23 2033 119/73 96 -- --  03/07/23 2025 97/80 (!) 105 -- 99 %  03/07/23 1818 125/72 (!) 108 18 --    Physical Exam Vitals and nursing note reviewed.  Constitutional:      General: She is in acute distress.  HENT:     Head: Normocephalic. No abrasion, masses or laceration.     Comments: Pain to palpation over the occiput.  Eyes:     Extraocular Movements: Extraocular movements intact.  Cardiovascular:     Rate and Rhythm: Regular rhythm.     Pulses: Normal pulses.     Heart sounds: Normal heart sounds.  Pulmonary:     Effort: Pulmonary effort is normal.     Breath sounds: Normal breath sounds.  Abdominal:     Comments: Lower abdomen tender to palpation. No masses on palpation.  Musculoskeletal:     Cervical back: Normal range of motion.  Skin:    General: Skin is warm and dry.     Capillary Refill: Capillary refill takes less than 2 seconds.  Neurological:     Mental Status: She is alert and  oriented to person, place, and time.     Comments: No numbness or tingling.      Cervical Exam  Not done  Bedside Ultrasound  not done  FHT Baby A:150bpm/Moderate variability/ 10x10 accels/ None decels CAT: 1 Baby B:160bpm/Moderate variability/ 10x10 accels/ None decels CAT: 1 Toco: none   Labs No results found for this or any previous visit (from the past 24 hour(s)).  Imaging No results found.  MAU Course  MDM: high  This patient presents to the ED for concern of   Chief Complaint  Patient presents with   Loss of Consciousness   Abdominal Pain     These complains involves an extensive number of treatment options, and is a complaint that carries with it a high risk of complications and morbidity.  The differential diagnosis for  1. Syncope INCLUDES most likely vasovagal syncope or orthostatic syncope, less likely cardiac syncope or psychogenic.   2. Abdominal pain INCLUDES most likely round ligament pain, cannot rule out placental abruption, or rupture of uterine mass at this time.   Co morbidities that complicate the patient evaluation:  Additional history obtained from mother  Interpreter services used: no  External records from outside source obtained and reviewed including Prenatal care records  Lab Tests: UA, CMP, CBC, and Other orthostatic vitals    I ordered, and personally interpreted labs.  The pertinent results include: Orthostatic vitals showed a decrease in systolic BP of 20 mmHg and an increase in heart rate at 1 minute standing. Remainder of labs pending  Imaging Studies ordered:  I ordered imaging studies including CT Head and abdominal ultrasound. Currently pending.   Cardiac Testing/Monitoring:  EKG was ordered today. I personally reviewed or consulted with a physician to help with intrepretation. EKG showed tachycardia with 1mm ST elevation in V1 and V2. May be artifact caused by patient movement.  Will repeat EKG.  Medicines ordered and  prescription drug management:  Medications: Tylenol and Flexeril for pain management.   I have reviewed the patients home medicines and have made adjustments as needed.  Critical Interventions: None  Consultations Obtained: None    MAU Course:  Dispostion: Observing in MAU while awaiting imaging and lab results    Assessment and Plan  1. Supervision of high risk pregnancy in second trimester 2. Syncope  Orthostatic BP showed a decrease in systolic BP of 20 mmHg with increased HR, consistent with orthostatic hypotension. Given history of dizziness due to inadequate fluid intake during this pregnancy and flushing/visual changes prior to LOC with normal cardiac exam, concern for cardiac cause decreased. Will follow up on repeat EKG for true elevation or artifact from patient movement. Will follow up CMP and CBC for possible metabolic or hematologic etiologies.  3. Head injury  Reassured against intracranial hemorrhage at this time given normal neuro exam. CT ordered for confirmation.  4 Abdominal pain Abdominal pain description most consistent with round ligament pain. Will also get abdominal ultrasound to rule out placental abruption or rupture of uterine mass.    Will continue to monitor while pending lab and imaging results at this time and start IV LR for hydration.       Allergies as of 03/07/2023       Reactions   Latex Rash   Sulfa Antibiotics Hives       Ezekiel Slocumb, Medical Student [employer] [facility] [employer] [facility] [employer] [facility] 03/07/23  8:42 PM  - Assumed Care @ 2000. Assessment at 9:15. Patient A& O x4. She passed Orthostatics. Awaiting Head CT and Abdominal US for status of twins.  - Upon reassessemnt pain still 9/10 for headhace and back pain. IV dilaudid ordered.  -  Sinus tach otherwise Normal EKG reviewed by Dr. Adrian Blackwater.  - Reassessment, patient reports that pain is down to a 4/10 but improving.  - Normal head CT. Low suspicion of trauma from  fall.  - PreLim [place] results reassuring of both fetuses.  - plan for discharge.   1. Supervision of high risk pregnancy in second trimester   2. Syncope, unspecified syncope type   3. [redacted] weeks gestation of pregnancy   4. Dichorionic diamniotic twin pregnancy in second trimester   5. Fall, initial encounter   6. Pelvic pain affecting pregnancy in second trimester, antepartum    - Reviewed worsening signs and return precautions. Recommended that patient rest over the next few days and soreness expectations.  - FHT appropriate for gestational ages.  - Patient has visit with OB-Cards next week. Encouraged to keep appointment - Patient discharged home in stable condition and may return to MAU as needed.     Shantonette Danella Deis) Suzie Portela, MSN, CNM  [facility]  03/08/2023 3:39 AM

## 2023-03-07 NOTE — MAU Note (Signed)
.  [name] is a 32 y.o. at 101w0d here in MAU reporting: EMS arrival pt in line at [employer]. Stated she started feeling hot and the saw black dots. Tried to check  but then next thing she new she was on the floor. . She fell back and head hit the floor. Reports back of head hurts and has extreme pain to  groin and vaginal area. Sharp shooting pain that hurts with movement. Reports good fetal movement. Denies any vag bleeding or leaking at this time.  LMP:  Onset of complaint:  Pain score: 7-10 There were no vitals filed for this visit.   FHT:A 160  B- 150 Lab orders placed from triage:

## 2023-03-08 ENCOUNTER — Encounter: Payer: Self-pay | Admitting: Obstetrics and Gynecology

## 2023-03-08 ENCOUNTER — Ambulatory Visit (INDEPENDENT_AMBULATORY_CARE_PROVIDER_SITE_OTHER): Payer: 59 | Admitting: Obstetrics and Gynecology

## 2023-03-08 VITALS — BP 107/72 | HR 116 | Wt 188.0 lb

## 2023-03-08 DIAGNOSIS — O0993 Supervision of high risk pregnancy, unspecified, third trimester: Secondary | ICD-10-CM

## 2023-03-08 DIAGNOSIS — O30041 Twin pregnancy, dichorionic/diamniotic, first trimester: Secondary | ICD-10-CM

## 2023-03-08 MED ORDER — CYCLOBENZAPRINE HCL 10 MG PO TABS: 10.0000 mg | ORAL_TABLET | Freq: Three times a day (TID) | ORAL | 2 refills | Status: DC | PRN

## 2023-03-08 NOTE — Progress Notes (Signed)
Pt had fall and was seen in MAU yesterday. Pt states she is not feeling well today.  Pt states increase in pelvic pressure.

## 2023-03-08 NOTE — Progress Notes (Signed)
   PRENATAL VISIT NOTE  Subjective:  [name] is a 32 y.o. [id number] at [redacted]w[redacted]d being seen today for ongoing prenatal care.  She is currently monitored for the following issues for this high-risk pregnancy and has Uterine mass; Dichorionic diamniotic twin pregnancy in first trimester; Supervision of high-risk pregnancy; and Anti -Lewis a antibody positive on their problem list.  Patient reports backache.  Contractions: Not present. Vag. Bleeding: None.  Movement: Present. Denies leaking of fluid.   The following portions of the patient's history were reviewed and updated as appropriate: allergies, current medications, past family history, past medical history, past social history, past surgical history and problem list.   Objective:   Vitals:   03/08/23 1444  BP: 107/72  Pulse: (!) 116  Weight: 188 lb (85.3 kg)    Fetal Status: Fetal Heart Rate (bpm): 160/155   Movement: Present     General:  Alert, oriented and cooperative. Patient is in no acute distress.  Skin: Skin is warm and dry. No rash noted.   Cardiovascular: Normal heart rate noted  Respiratory: Normal respiratory effort, no problems with respiration noted  Abdomen: Soft, gravid, appropriate for gestational age.  Pain/Pressure: Present     Pelvic: Cervical exam deferred        Extremities: Normal range of motion.     Mental Status: Normal mood and affect. Normal behavior. Normal judgment and thought content.   Assessment and Plan:  Pregnancy: [id number] at [redacted]w[redacted]d 1. Supervision of high risk pregnancy in third trimester Patient is doing well, reporting back pain and pelvic pain since recent fall. Rx flexeril provided Patient advised to wear a maternity support belt Patient advised to continue physical therapy Work note provided until 9/18 Patient also reports frequent syncopal episodes which caused recent fall- patient referred to cardiology Third trimester labs and glucola next visit  2. Dichorionic diamniotic twin  pregnancy in first trimester Follow up growth on 9/13  Preterm labor symptoms and general obstetric precautions including but not limited to vaginal bleeding, contractions, leaking of fluid and fetal movement were reviewed in detail with the patient. Please refer to After Visit Summary for other counseling recommendations.   Return in about 2 weeks (around 03/22/2023) for in person, ROB, High risk, 2 hr glucola next visit.  Future Appointments  Date Time Provider Department Center  03/11/2023  7:15 AM [facility] NURSE [facility]  03/11/2023  7:30 AM [facility] US1 [facility]  03/15/2023 11:45 AM Fields, Victorino Dike B, PT [facility] None  03/17/2023  2:00 PM Mikey Kirschner B, PT [facility] None  03/22/2023 12:30 PM Mikey Kirschner B, PT [facility] None  03/24/2023 12:30 PM Mikey Kirschner B, PT [facility] None  03/29/2023 12:30 PM Mikey Kirschner B, PT [facility] None  03/31/2023  2:00 PM Mikey Kirschner B, PT [facility] None  04/05/2023  2:00 PM Mikey Kirschner B, PT [facility] None  04/07/2023  2:00 PM Mikey Kirschner B, PT [facility] None  04/12/2023 12:30 PM Mikey Kirschner B, PT [facility] None  04/14/2023  2:00 PM Vernell Barrier, PT [facility] None  04/19/2023 12:30 PM Vernell Barrier, PT [facility] None  04/21/2023  2:00 PM Fields, Lilyan Gilford, PT [facility] None    Catalina Antigua, MD

## 2023-03-11 ENCOUNTER — Ambulatory Visit: Payer: 59 | Attending: Obstetrics

## 2023-03-11 ENCOUNTER — Other Ambulatory Visit: Payer: Self-pay | Admitting: Obstetrics & Gynecology

## 2023-03-11 ENCOUNTER — Ambulatory Visit: Payer: 59 | Admitting: *Deleted

## 2023-03-11 ENCOUNTER — Encounter: Payer: Self-pay | Admitting: Obstetrics

## 2023-03-11 VITALS — BP 121/81 | HR 111

## 2023-03-11 DIAGNOSIS — O365920 Maternal care for other known or suspected poor fetal growth, second trimester, not applicable or unspecified: Secondary | ICD-10-CM

## 2023-03-11 DIAGNOSIS — Z3A26 26 weeks gestation of pregnancy: Secondary | ICD-10-CM

## 2023-03-11 DIAGNOSIS — O365922 Maternal care for other known or suspected poor fetal growth, second trimester, fetus 2: Secondary | ICD-10-CM

## 2023-03-11 DIAGNOSIS — O30042 Twin pregnancy, dichorionic/diamniotic, second trimester: Secondary | ICD-10-CM | POA: Insufficient documentation

## 2023-03-11 DIAGNOSIS — O0993 Supervision of high risk pregnancy, unspecified, third trimester: Secondary | ICD-10-CM | POA: Insufficient documentation

## 2023-03-11 DIAGNOSIS — O285 Abnormal chromosomal and genetic finding on antenatal screening of mother: Secondary | ICD-10-CM | POA: Diagnosis not present

## 2023-03-11 DIAGNOSIS — O30043 Twin pregnancy, dichorionic/diamniotic, third trimester: Secondary | ICD-10-CM

## 2023-03-11 DIAGNOSIS — O365921 Maternal care for other known or suspected poor fetal growth, second trimester, fetus 1: Secondary | ICD-10-CM | POA: Insufficient documentation

## 2023-03-11 DIAGNOSIS — O30041 Twin pregnancy, dichorionic/diamniotic, first trimester: Secondary | ICD-10-CM

## 2023-03-15 ENCOUNTER — Ambulatory Visit: Payer: 59

## 2023-03-15 DIAGNOSIS — M5459 Other low back pain: Secondary | ICD-10-CM | POA: Diagnosis not present

## 2023-03-15 DIAGNOSIS — R252 Cramp and spasm: Secondary | ICD-10-CM

## 2023-03-15 DIAGNOSIS — M6281 Muscle weakness (generalized): Secondary | ICD-10-CM

## 2023-03-15 DIAGNOSIS — R262 Difficulty in walking, not elsewhere classified: Secondary | ICD-10-CM

## 2023-03-15 DIAGNOSIS — R293 Abnormal posture: Secondary | ICD-10-CM

## 2023-03-15 NOTE — Therapy (Signed)
OUTPATIENT PHYSICAL THERAPY THORACOLUMBAR EVALUATION   Patient Name: FLORDIA FICARRA MRN: [id number] DOB:October 10, 1990, 32 y.o., female Today's Date: 03/15/2023  END OF SESSION:  PT End of Session - 03/15/23 1144     Visit Number 3    Date for PT Re-Evaluation 04/21/23    Authorization Type UHC primary/ Medicaid secondary    PT Start Time 1145    PT Stop Time 1226    PT Time Calculation (min) 41 min    Activity Tolerance Patient tolerated treatment well    Behavior During Therapy WFL for tasks assessed/performed             Past Medical History:  Diagnosis Date   BV (bacterial vaginosis)    Trichomonas    Past Surgical History:  Procedure Laterality Date   DILATION AND CURETTAGE OF UTERUS     Patient Active Problem List   Diagnosis Date Noted   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   Supervision of high-risk pregnancy 11/09/2022   Dichorionic diamniotic twin pregnancy in first trimester 10/18/2022   Uterine mass 08/11/2022    PCP: Warden Fillers, MD   REFERRING PROVIDER: Conan Bowens, MD  REFERRING DIAG: Warden Fillers, MD   Rationale for Evaluation and Treatment: Rehabilitation  THERAPY DIAG:  Other low back pain  Abnormal posture  Cramp and spasm  Difficulty in walking, not elsewhere classified  Muscle weakness (generalized)  ONSET DATE: 02/08/2023  SUBJECTIVE:                                                                                                                                                                                           SUBJECTIVE STATEMENT: Patient states she passed out while shopping and fell.  She is having increased low back pain since the fall.  She rates this pain at 9/10.  She states she feels like she is going to "give birth".  She was checked at ED and no formal contractions.  MD says likely Braxton-Hicks.  OB states that the twins heads have descended a bit as well which may me causing increased pain and  pressure.  She confirms that she is [redacted] weeks gestation.    PERTINENT HISTORY:  na  PAIN:  Are you having pain? Yes: NPRS scale: 9/10 Pain location: low back and right leg Pain description: aching Aggravating factors: standing Relieving factors: ice heat  PRECAUTIONS: None  RED FLAGS: None   WEIGHT BEARING RESTRICTIONS: No  FALLS:  Has patient fallen in last 6 months? No  LIVING ENVIRONMENT: Lives with: lives with their family Lives in: House/apartment  OCCUPATION: lab tech  PLOF: Independent,  Independent with basic ADLs, Independent with household mobility without device, Independent with community mobility without device, Independent with homemaking with ambulation, Independent with gait, and Independent with transfers  PATIENT GOALS: She hopes to be able to continue to work and care for her boys at home without having to miss work or have someone help with the boys.   NEXT MD VISIT: na  OBJECTIVE:   DIAGNOSTIC FINDINGS:  na  PATIENT SURVEYS:  Modified Oswestry 16/50= 32%   SCREENING FOR RED FLAGS: Bowel or bladder incontinence: No Spinal tumors: No Cauda equina syndrome: No Compression fracture: No Abdominal aneurysm: No  COGNITION: Overall cognitive status: Within functional limits for tasks assessed     SENSATION: WFL  MUSCLE LENGTH: Hamstrings: Right 50 deg; Left 55 deg Thomas test: Right pos; Left pos  POSTURE: increased lumbar lordosis  PALPATION: Tender to palpation on right > left low back area  LUMBAR ROM:   WNL including flexion (fingertips to toes)  LOWER EXTREMITY ROM:     WNL  LOWER EXTREMITY MMT:    All 5/5 with exception of bilateral hip adduction 4-/5 , hip extension and abduction generally 4/5.  (Patient had pain with proximal hip testing)  LUMBAR SPECIAL TESTS:  Straight leg raise test: Negative  FUNCTIONAL TESTS:  5 times sit to stand: 14.97 sec Timed up and go (TUG): 11.57 sec  GAIT: Distance walked:  50 Assistive device utilized: None Level of assistance: Complete Independence Comments: slow antalgic  TODAY'S TREATMENT:                                                                                                                              DATE: 03/15/23 Nustep x 5 min level 1 Supine hamstring stretch with strap 1 x 30 sec each LE (patient was unable to do this without fatigue and pain, therefore, PT did manual hamstring stretching for another 2 x 30 sec each LE) PPT x 20 with 2-3 sec hold to focus on TA contraction (verbal cues for correct technique) Hooklying trunk rotation x 20 Hooklying combination hip IR/ER stretch  Hooklying SKTC x 5 each LE holding 10 sec each Moist heat x 10 min to lumbar spine in hook lying Educated patient on need to walk more and be mobile due to the soreness she will incur post fall.  Check to be sure OB/GYN was in favor of this post fall.    DATE: 03/01/23 Nustep x 5 min level 2 Standing hamstring stretch 3 x 30 sec Standing quad/hip flexor stretch 3 x 30 sec Seated piriformis stretch 3 x 30 sec  Hooklying TA contraction x 20 Hooklying TA with mini isolated heel taps  Hooklying TA with mini heel taps in and out Bridging x 10 slow hold x 2-3 seconds in bridge  DATE: 02/24/23 Initial eval completed and initiated HEP    PATIENT EDUCATION:  Education details: Initiated HEP Person educated: Patient Education method: Programmer, multimedia, Demonstration, Verbal cues, and Handouts  Education comprehension: verbalized understanding, returned demonstration, and verbal cues required  HOME EXERCISE PROGRAM: Access Code: [email or web address] URL: [email or web address] Date: 02/24/2023 Prepared by: Mikey Kirschner  Exercises - Standing Hamstring Stretch on Chair  - 1 x daily - 7 x weekly - 1 sets - 3 reps - 30 sec hold - Quadricep Stretch with Chair and Counter Support  - 1 x daily - 7 x weekly - 1 sets - 3 reps - 30 sec hold - Seated Piriformis Stretch with  Trunk Bend  - 1 x daily - 7 x weekly - 1 sets - 3 reps - 30 sec hold  ASSESSMENT:  CLINICAL IMPRESSION: Sigourney was extremely guarded and reporting elevated pain.  She reported pain level at 8/10 post session.  She states she still feels pressure at the pelvic floor.  Encouraged her to stay in contact with her OB/GYN if this worsens.  She was able to do a few simple stretches but pain level remained fairly high throughout.    She would respond well to continued skilled PT for LE flexibility, TA strengthening and core stabilization.    OBJECTIVE IMPAIRMENTS: decreased knowledge of condition, difficulty walking, decreased ROM, decreased strength, and decreased safety awareness.   ACTIVITY LIMITATIONS: carrying, lifting, bending, standing, squatting, sleeping, stairs, transfers, bed mobility, bathing, toileting, dressing, reach over head, and hygiene/grooming  PARTICIPATION LIMITATIONS: meal prep, cleaning, laundry, driving, shopping, community activity, occupation, yard work, and school  PERSONAL FACTORS: 1 comorbidity: pregnancy  are also affecting patient's functional outcome.   REHAB POTENTIAL: Good  CLINICAL DECISION MAKING: Stable/uncomplicated  EVALUATION COMPLEXITY: Low   GOALS: Goals reviewed with patient? Yes  SHORT TERM GOALS: Target date: 03/25/2023   Pain report to be no greater than 4/10  Baseline: Goal status: In progress  2.  Patient will be independent with initial HEP  Baseline:  Goal status: In progress   LONG TERM GOALS: Target date: 04/22/2023   Patient to report pain no greater than 2/10  Baseline:  Goal status: INITIAL  2.  Patient to be independent with advanced HEP  Baseline:  Goal status: INITIAL  3.  Patient to be able to stand or walk for at least 15 min without leg pain  Baseline:  Goal status: INITIAL  4.  Radicular symptoms to be centralized Baseline:  Goal status: INITIAL  5.  Patient to report 85% improvement in overall symptoms   Baseline:  Goal status: INITIAL  6.  Patient to be able to continue working Baseline:  Goal status: INITIAL  PLAN:  PT FREQUENCY: 1-2x/week  PT DURATION: 8 weeks  PLANNED INTERVENTIONS: Therapeutic exercises, Therapeutic activity, Neuromuscular re-education, Balance training, Gait training, Patient/Family education, Self Care, Joint mobilization, Stair training, Aquatic Therapy, Dry Needling, Cryotherapy, Moist heat, Manual therapy, and Re-evaluation.  PLAN FOR NEXT SESSION: Assess status post fall, Nustep, flexibility exercises, pelvic floor/TA and core stabilization   Thelma Lorenzetti B. Reyne Falconi, PT 03/15/23 12:23 PM [facility] [address], [address] [place], [place] [address] Phone # [phone] Fax [phone]

## 2023-03-17 ENCOUNTER — Ambulatory Visit: Payer: 59

## 2023-03-17 DIAGNOSIS — M5459 Other low back pain: Secondary | ICD-10-CM | POA: Diagnosis not present

## 2023-03-17 DIAGNOSIS — R262 Difficulty in walking, not elsewhere classified: Secondary | ICD-10-CM

## 2023-03-17 DIAGNOSIS — R252 Cramp and spasm: Secondary | ICD-10-CM

## 2023-03-17 DIAGNOSIS — R293 Abnormal posture: Secondary | ICD-10-CM

## 2023-03-17 DIAGNOSIS — M6281 Muscle weakness (generalized): Secondary | ICD-10-CM

## 2023-03-17 NOTE — Therapy (Signed)
OUTPATIENT PHYSICAL THERAPY THORACOLUMBAR EVALUATION   Patient Name: [name] MRN: [id number] DOB:1991/03/24, 32 y.o., female Today's Date: 03/17/2023  END OF SESSION:  PT End of Session - 03/17/23 1406     Visit Number 4    Date for PT Re-Evaluation 04/21/23    Authorization Type UHC primary/ Medicaid secondary    PT Start Time 1403    PT Stop Time 1448    PT Time Calculation (min) 45 min    Activity Tolerance Patient tolerated treatment well    Behavior During Therapy WFL for tasks assessed/performed              Past Medical History:  Diagnosis Date   BV (bacterial vaginosis)    Trichomonas    Past Surgical History:  Procedure Laterality Date   DILATION AND CURETTAGE OF UTERUS     Patient Active Problem List   Diagnosis Date Noted   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   Supervision of high-risk pregnancy 11/09/2022   Dichorionic diamniotic twin pregnancy in first trimester 10/18/2022   Uterine mass 08/11/2022    PCP: Warden Fillers, MD   REFERRING PROVIDER: Conan Bowens, MD  REFERRING DIAG: Warden Fillers, MD   Rationale for Evaluation and Treatment: Rehabilitation  THERAPY DIAG:  Other low back pain  Abnormal posture  Cramp and spasm  Difficulty in walking, not elsewhere classified  Muscle weakness (generalized)  ONSET DATE: 02/08/2023  SUBJECTIVE:                                                                                                                                                                                           SUBJECTIVE STATEMENT: Patient states she is having mild contractions but the MD is contributing it to [employer] contractions. She is having 5/10 pain currently. She is still feeling pressure in her vaginal area. Her next follow up appointment is next Tuesday. She experienced some numbness in her legs yesterday and standing for 10-15 minutes made it go away.  PERTINENT HISTORY:  na  PAIN:   Are you having pain? Yes: NPRS scale: 9/10 Pain location: low back and right leg Pain description: aching Aggravating factors: standing Relieving factors: ice heat  PRECAUTIONS: None  RED FLAGS: None   WEIGHT BEARING RESTRICTIONS: No  FALLS:  Has patient fallen in last 6 months? No  LIVING ENVIRONMENT: Lives with: lives with their family Lives in: House/apartment  OCCUPATION: lab tech  PLOF: Independent, Independent with basic ADLs, Independent with household mobility without device, Independent with community mobility without device, Independent with homemaking with ambulation, Independent with gait, and Independent with transfers  PATIENT GOALS: She hopes to be able to continue to work and care for her boys at home without having to miss work or have someone help with the boys.   NEXT MD VISIT: na  OBJECTIVE:   DIAGNOSTIC FINDINGS:  na  PATIENT SURVEYS:  Modified Oswestry 16/50= 32%   SCREENING FOR RED FLAGS: Bowel or bladder incontinence: No Spinal tumors: No Cauda equina syndrome: No Compression fracture: No Abdominal aneurysm: No  COGNITION: Overall cognitive status: Within functional limits for tasks assessed     SENSATION: WFL  MUSCLE LENGTH: Hamstrings: Right 50 deg; Left 55 deg Thomas test: Right pos; Left pos  POSTURE: increased lumbar lordosis  PALPATION: Tender to palpation on right > left low back area  LUMBAR ROM:   WNL including flexion (fingertips to toes)  LOWER EXTREMITY ROM:     WNL  LOWER EXTREMITY MMT:    All 5/5 with exception of bilateral hip adduction 4-/5 , hip extension and abduction generally 4/5.  (Patient had pain with proximal hip testing)  LUMBAR SPECIAL TESTS:  Straight leg raise test: Negative  FUNCTIONAL TESTS:  5 times sit to stand: 14.97 sec Timed up and go (TUG): 11.57 sec  GAIT: Distance walked: 50 Assistive device utilized: None Level of assistance: Complete Independence Comments: slow  antalgic  TODAY'S TREATMENT:                                                                                                                              DATE:   03/17/23 Nustep x 5 min level 1 Hooklying trunk rotation x 10 each side Hooklying posterior pelvic tilt + pelvic floor contraction x 10 Hooklying combination hip IR/ER stretch x10 Supine manual hamstring stretching 5x5 sec each LE (Lt tighter than Rt) Hooklying with feet elevated on 20 inch box + pelvic floor contraction x 20 Hooklying with feet elevated on 20 inch box + mini bridge x 10  Hooklying with feet elevated on 20 inch box + mini bridge + heel raise x 10 Hooklying march with feet elevated on 20 inch box for hip mobility  Hooklying heel raise with posterior pelvic tilt Hooklying heel tap into an arcing motion Moist heat x 10 min to lumbar spine in hook lying  03/15/23 Nustep x 5 min level 1 Supine hamstring stretch with strap 1 x 30 sec each LE (patient was unable to do this without fatigue and pain, therefore, PT did manual hamstring stretching for another 2 x 30 sec each LE) PPT x 20 with 2-3 sec hold to focus on TA contraction (verbal cues for correct technique) Hooklying trunk rotation x 20 Hooklying combination hip IR/ER stretch  Hooklying SKTC x 5 each LE holding 10 sec each Moist heat x 10 min to lumbar spine in hook lying Educated patient on need to walk more and be mobile due to the soreness she will incur post fall.  Check to be sure OB/GYN was in favor of this  post fall.    DATE: 03/01/23 Nustep x 5 min level 2 Standing hamstring stretch 3 x 30 sec Standing quad/hip flexor stretch 3 x 30 sec Seated piriformis stretch 3 x 30 sec  Hooklying TA contraction x 20 Hooklying TA with mini isolated heel taps  Hooklying TA with mini heel taps in and out Bridging x 10 slow hold x 2-3 seconds in bridge  DATE: 02/24/23 Initial eval completed and initiated HEP    PATIENT EDUCATION:  Education details: Initiated  HEP Person educated: Patient Education method: Programmer, multimedia, Facilities manager, Verbal cues, and Handouts Education comprehension: verbalized understanding, returned demonstration, and verbal cues required  HOME EXERCISE PROGRAM: Access Code: [email or web address] URL: [email or web address] Date: 02/24/2023 Prepared by: Mikey Kirschner  Exercises - Standing Hamstring Stretch on Chair  - 1 x daily - 7 x weekly - 1 sets - 3 reps - 30 sec hold - Quadricep Stretch with Chair and Counter Support  - 1 x daily - 7 x weekly - 1 sets - 3 reps - 30 sec hold - Seated Piriformis Stretch with Trunk Bend  - 1 x daily - 7 x weekly - 1 sets - 3 reps - 30 sec hold  ASSESSMENT:  CLINICAL IMPRESSION: Today's treatment session focused on gentle stretching and core strengthening. Evelena continues to experience mild contractions and discomfort. Patient required verbal cues for correct performance of TA and pelvic floor contraction. Performed multiple exercises with patient's feet elevated on a 20 inch box to allow gravity to assist movement and relieve pressure on pelvic floor. Patient tolerated treatment session well . Patient will continue to benefit from skilled therapy to address current impairments and functional limitations listed below.  OBJECTIVE IMPAIRMENTS: decreased knowledge of condition, difficulty walking, decreased ROM, decreased strength, and decreased safety awareness.   ACTIVITY LIMITATIONS: carrying, lifting, bending, standing, squatting, sleeping, stairs, transfers, bed mobility, bathing, toileting, dressing, reach over head, and hygiene/grooming  PARTICIPATION LIMITATIONS: meal prep, cleaning, laundry, driving, shopping, community activity, occupation, yard work, and school  PERSONAL FACTORS: 1 comorbidity: pregnancy  are also affecting patient's functional outcome.   REHAB POTENTIAL: Good  CLINICAL DECISION MAKING: Stable/uncomplicated  EVALUATION COMPLEXITY: Low   GOALS: Goals  reviewed with patient? Yes  SHORT TERM GOALS: Target date: 03/25/2023   Pain report to be no greater than 4/10  Baseline: Goal status: In progress  2.  Patient will be independent with initial HEP  Baseline:  Goal status: In progress   LONG TERM GOALS: Target date: 04/22/2023   Patient to report pain no greater than 2/10  Baseline:  Goal status: INITIAL  2.  Patient to be independent with advanced HEP  Baseline:  Goal status: INITIAL  3.  Patient to be able to stand or walk for at least 15 min without leg pain  Baseline:  Goal status: INITIAL  4.  Radicular symptoms to be centralized Baseline:  Goal status: INITIAL  5.  Patient to report 85% improvement in overall symptoms  Baseline:  Goal status: INITIAL  6.  Patient to be able to continue working Baseline:  Goal status: INITIAL  PLAN:  PT FREQUENCY: 1-2x/week  PT DURATION: 8 weeks  PLANNED INTERVENTIONS: Therapeutic exercises, Therapeutic activity, Neuromuscular re-education, Balance training, Gait training, Patient/Family education, Self Care, Joint mobilization, Stair training, Aquatic Therapy, Dry Needling, Cryotherapy, Moist heat, Manual therapy, and Re-evaluation.  PLAN FOR NEXT SESSION: Assess status ;flexibility exercises, pelvic floor/TA and core stabilization   Ciera Evans,PT Remingtyn Depaola B. Harshika Mago, PT 03/17/23 5:13 PM [facility]  [address] [place], [place] [address] Phone # [phone] Fax [phone]

## 2023-03-22 ENCOUNTER — Ambulatory Visit: Payer: 59

## 2023-03-22 ENCOUNTER — Encounter: Payer: Self-pay | Admitting: Obstetrics & Gynecology

## 2023-03-22 ENCOUNTER — Ambulatory Visit: Payer: 59 | Admitting: Obstetrics & Gynecology

## 2023-03-22 ENCOUNTER — Other Ambulatory Visit: Payer: 59

## 2023-03-22 VITALS — BP 114/76 | HR 109 | Wt 189.0 lb

## 2023-03-22 DIAGNOSIS — M6281 Muscle weakness (generalized): Secondary | ICD-10-CM

## 2023-03-22 DIAGNOSIS — Z3A28 28 weeks gestation of pregnancy: Secondary | ICD-10-CM

## 2023-03-22 DIAGNOSIS — Z1339 Encounter for screening examination for other mental health and behavioral disorders: Secondary | ICD-10-CM

## 2023-03-22 DIAGNOSIS — O0993 Supervision of high risk pregnancy, unspecified, third trimester: Secondary | ICD-10-CM

## 2023-03-22 DIAGNOSIS — M5459 Other low back pain: Secondary | ICD-10-CM | POA: Diagnosis not present

## 2023-03-22 DIAGNOSIS — R293 Abnormal posture: Secondary | ICD-10-CM

## 2023-03-22 DIAGNOSIS — O30043 Twin pregnancy, dichorionic/diamniotic, third trimester: Secondary | ICD-10-CM | POA: Diagnosis not present

## 2023-03-22 DIAGNOSIS — O30041 Twin pregnancy, dichorionic/diamniotic, first trimester: Secondary | ICD-10-CM

## 2023-03-22 DIAGNOSIS — R252 Cramp and spasm: Secondary | ICD-10-CM

## 2023-03-22 DIAGNOSIS — R262 Difficulty in walking, not elsewhere classified: Secondary | ICD-10-CM

## 2023-03-22 NOTE — Progress Notes (Unsigned)
PRENATAL VISIT NOTE  Subjective:  [name] is a 32 y.o. [id number] at [redacted]w[redacted]d being seen today for ongoing prenatal care.  She is currently monitored for the following issues for this high-risk pregnancy and has Uterine mass; Dichorionic diamniotic twin pregnancy in first trimester; Supervision of high-risk pregnancy; and Anti -Lewis a antibody positive on their problem list.  Patient reports  both legs get numb occasionally .  Contractions: Irritability. Vag. Bleeding: None.  Movement: Present. Denies leaking of fluid.   The following portions of the patient's history were reviewed and updated as appropriate: allergies, current medications, past family history, past medical history, past social history, past surgical history and problem list.   Objective:   Vitals:   03/22/23 0831  BP: 114/76  Pulse: (!) 109  Weight: 189 lb (85.7 kg)    Fetal Status: Fetal Heart Rate (bpm): A:143//B:146   Movement: Present     General:  Alert, oriented and cooperative. Patient is in no acute distress.  Skin: Skin is warm and dry. No rash noted.   Cardiovascular: Normal heart rate noted  Respiratory: Normal respiratory effort, no problems with respiration noted  Abdomen: Soft, gravid, appropriate for gestational age.  Pain/Pressure: Present     Pelvic: Cervical exam deferred        Extremities: Normal range of motion.  Edema: Trace  Mental Status: Normal mood and affect. Normal behavior. Normal judgment and thought content.   Assessment and Plan:  Pregnancy: [id number] at [redacted]w[redacted]d 1. Supervision of high risk pregnancy in third trimester 28 week - Glucose Tolerance, 2 Hours w/1 Hour - RPR - CBC - HIV antibody (with reflex)  2. [redacted] weeks gestation of pregnancy  - Glucose Tolerance, 2 Hours w/1 Hour - RPR - CBC - HIV antibody (with reflex)  3. Dichorionic diamniotic twin pregnancy in first trimester Leg numbness refer to PT - Ambulatory referral to Physical Therapy  Preterm labor symptoms  and general obstetric precautions including but not limited to vaginal bleeding, contractions, leaking of fluid and fetal movement were reviewed in detail with the patient. Please refer to After Visit Summary for other counseling recommendations.   Return in about 2 weeks (around 04/05/2023).  Future Appointments  Date Time Provider Department Center  03/22/2023 11:15 AM Adam Phenix, MD [facility] None  03/22/2023 12:30 PM Mikey Kirschner B, PT [facility] None  03/24/2023  8:15 AM [facility] NURSE [facility]  03/24/2023  8:30 AM [facility] US5 [facility]  03/24/2023 10:45 AM [facility] NST [facility]  03/24/2023 12:30 PM Fields, Victorino Dike B, PT [facility] None  03/29/2023 12:30 PM Mikey Kirschner B, PT [facility] None  03/31/2023  9:20 AM Tobb, Lavona Mound, DO [facility] None  03/31/2023  2:00 PM Mikey Kirschner B, PT [facility] None  04/01/2023  9:30 AM [facility] NURSE [facility]  04/01/2023  9:45 AM [facility] NST [facility]  04/01/2023 10:30 AM [facility] US2 [facility]  04/05/2023  2:00 PM Fields, Jennifer B, PT [facility] None  04/07/2023  2:00 PM Mikey Kirschner B, PT [facility] None  04/08/2023  8:15 AM [facility] NURSE [facility]  04/08/2023  8:30 AM [facility] US4 [facility]  04/08/2023 10:45 AM [facility] NST [facility]  04/12/2023 12:30 PM Fields, Jennifer B, PT [facility] None  04/14/2023  2:00 PM Mikey Kirschner B, PT [facility] None  04/15/2023  8:15 AM [facility] NURSE [facility]  04/15/2023  8:30 AM [facility] US4 [facility]  04/15/2023  9:45 AM [facility] NST [facility]  04/19/2023 12:30 PM Fields, [employer]  B, PT [facility] None  04/21/2023  2:00 PM Fields, Lilyan Gilford, PT [facility] None    Scheryl Darter, MD

## 2023-03-22 NOTE — Progress Notes (Signed)
ROB, c/o pressure and pain.

## 2023-03-22 NOTE — Therapy (Addendum)
 OUTPATIENT PHYSICAL THERAPY THORACOLUMBAR TREATMENT PHYSICAL THERAPY DISCHARGE SUMMARY  Visits from Start of Care: 5  Current functional level related to goals / functional outcomes: See below   Remaining deficits: See below   Education / Equipment: See below   Patient agrees to discharge. Patient goals were not met. Patient is being discharged due to not returning since the last visit.    Patient Name: [name] MRN: [id number] DOB:[birth year], 32 y.o., female Today's Date: 03/13/2024  END OF SESSION:     Past Medical History:  Diagnosis Date   BV (bacterial vaginosis)    Gestational diabetes    Pregnancy induced hypertension    Trichomonas    Past Surgical History:  Procedure Laterality Date   DILATION AND CURETTAGE OF UTERUS     Patient Active Problem List   Diagnosis Date Noted   Acute on chronic blood loss anemia 05/17/2023   IUGR (intrauterine growth restriction) affecting care of mother, third trimester, fetus 1 05/15/2023   Preeclampsia, third trimester 05/13/2023   GDM (gestational diabetes mellitus) 04/06/2023   Fetal growth restriction antepartum 03/25/2023   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   Supervision of high-risk pregnancy 11/09/2022   Twin pregnancy delivered vaginally 10/18/2022   Uterine mass 08/11/2022    PCP: Zina Jerilynn LABOR, MD   REFERRING PROVIDER: Nicholaus Burnard HERO, MD  REFERRING DIAG: Zina Jerilynn LABOR, MD   Rationale for Evaluation and Treatment: Rehabilitation  THERAPY DIAG:  Abnormal posture  Cramp and spasm  Difficulty in walking, not elsewhere classified  Muscle weakness (generalized)  Other low back pain  ONSET DATE: 02/08/2023  SUBJECTIVE:                                                                                                                                                                                           SUBJECTIVE STATEMENT: Patient states she continues to experience numbness in  both legs and the feeling of passing out.  She states the leg numbness is worse than last week.  She saw MD today and mentions this to him.  She will be going to cardiologist for exam as well as neurologist for assessment of these symptoms.    PERTINENT HISTORY:  na  PAIN:  Are you having pain? Yes: NPRS scale: 9/10 Pain location: low back and right leg Pain description: aching Aggravating factors: standing Relieving factors: ice heat  PRECAUTIONS: None  RED FLAGS: None   WEIGHT BEARING RESTRICTIONS: No  FALLS:  Has patient fallen in last 6 months? No  LIVING ENVIRONMENT: Lives with: lives with their family Lives in: House/apartment  OCCUPATION: lab tech  PLOF:  Independent, Independent with basic ADLs, Independent with household mobility without device, Independent with community mobility without device, Independent with homemaking with ambulation, Independent with gait, and Independent with transfers  PATIENT GOALS: She hopes to be able to continue to work and care for her boys at home without having to miss work or have someone help with the boys.   NEXT MD VISIT: na  OBJECTIVE:   DIAGNOSTIC FINDINGS:  na  PATIENT SURVEYS:  Modified Oswestry 16/50= 32%   SCREENING FOR RED FLAGS: Bowel or bladder incontinence: No Spinal tumors: No Cauda equina syndrome: No Compression fracture: No Abdominal aneurysm: No  COGNITION: Overall cognitive status: Within functional limits for tasks assessed     SENSATION: WFL  MUSCLE LENGTH: Hamstrings: Right 50 deg; Left 55 deg Thomas test: Right pos; Left pos  POSTURE: increased lumbar lordosis  PALPATION: Tender to palpation on right > left low back area  LUMBAR ROM:   WNL including flexion (fingertips to toes)  LOWER EXTREMITY ROM:     WNL  LOWER EXTREMITY MMT:    All 5/5 with exception of bilateral hip adduction 4-/5 , hip extension and abduction generally 4/5.  (Patient had pain with proximal hip  testing)  LUMBAR SPECIAL TESTS:  Straight leg raise test: Negative  FUNCTIONAL TESTS:  5 times sit to stand: 14.97 sec Timed up and go (TUG): 11.57 sec  GAIT: Distance walked: 50 Assistive device utilized: None Level of assistance: Complete Independence Comments: slow antalgic  TODAY'S TREATMENT:                                                                                                                              DATE:  03/22/23 Nustep x 5 min level 1 Attempted supine diaphragmatic breathing but patient began to feel cramping in right foot which worsened with continuing in supine position.  (We had patient's legs propped on wedge so we removed the wedge.  Patient continued to have symptoms and she felt the blacking out sensation.   Had patient sit up with assistance.  Patient states that she felt better but still cramping in right leg.   Had patient try ankle pumps x 20 each LE.  She then started having numbness in left LE.   PT ultimately decided that patient should be placed on hold from PT due to inconsistent symptoms and upcoming referrals to cardiologist.    03/17/23 Nustep x 5 min level 1 Hooklying trunk rotation x 10 each side Hooklying posterior pelvic tilt + pelvic floor contraction x 10 Hooklying combination hip IR/ER stretch x10 Supine manual hamstring stretching 5x5 sec each LE (Lt tighter than Rt) Hooklying with feet elevated on 20 inch box + pelvic floor contraction x 20 Hooklying with feet elevated on 20 inch box + mini bridge x 10  Hooklying with feet elevated on 20 inch box + mini bridge + heel raise x 10 Hooklying march with feet elevated on 20 inch box for hip  mobility  Hooklying heel raise with posterior pelvic tilt Hooklying heel tap into an arcing motion Moist heat x 10 min to lumbar spine in hook lying  03/15/23 Nustep x 5 min level 1 Supine hamstring stretch with strap 1 x 30 sec each LE (patient was unable to do this without fatigue and pain,  therefore, PT did manual hamstring stretching for another 2 x 30 sec each LE) PPT x 20 with 2-3 sec hold to focus on TA contraction (verbal cues for correct technique) Hooklying trunk rotation x 20 Hooklying combination hip IR/ER stretch  Hooklying SKTC x 5 each LE holding 10 sec each Moist heat x 10 min to lumbar spine in hook lying Educated patient on need to walk more and be mobile due to the soreness she will incur post fall.  Check to be sure OB/GYN was in favor of this post fall.    DATE: 02/24/23 Initial eval completed and initiated HEP    PATIENT EDUCATION:  Education details: Initiated HEP Person educated: Patient Education method: Programmer, multimedia, Facilities manager, Verbal cues, and Handouts Education comprehension: verbalized understanding, returned demonstration, and verbal cues required  HOME EXERCISE PROGRAM: Access Code: [email or web address] URL: [email or web address] Date: 02/24/2023 Prepared by: Delon Haddock  Exercises - Standing Hamstring Stretch on Chair  - 1 x daily - 7 x weekly - 1 sets - 3 reps - 30 sec hold - Quadricep Stretch with Chair and Counter Support  - 1 x daily - 7 x weekly - 1 sets - 3 reps - 30 sec hold - Seated Piriformis Stretch with Trunk Bend  - 1 x daily - 7 x weekly - 1 sets - 3 reps - 30 sec hold  ASSESSMENT:  CLINICAL IMPRESSION: Wilba is having symptoms that are inconsistent with any typical lumbar spine pain.  Her symptoms worsen with lying down and her legs go numb but these symptoms are relieved with standing.  Upon standing, she drinks a lot of water as recommended, and is more comfortable.  If she stands in one spot for too long, however, she feels like she is going to pass out.  Her original low back and pubic symphysis pain seems well controlled with her HEP.  Due to her variable symptoms mentioned above, we have decided to hold off on any further PT at this time.    OBJECTIVE IMPAIRMENTS: decreased knowledge of condition, difficulty  walking, decreased ROM, decreased strength, and decreased safety awareness.   ACTIVITY LIMITATIONS: carrying, lifting, bending, standing, squatting, sleeping, stairs, transfers, bed mobility, bathing, toileting, dressing, reach over head, and hygiene/grooming  PARTICIPATION LIMITATIONS: meal prep, cleaning, laundry, driving, shopping, community activity, occupation, yard work, and school  PERSONAL FACTORS: 1 comorbidity: pregnancy are also affecting patient's functional outcome.   REHAB POTENTIAL: Good  CLINICAL DECISION MAKING: Stable/uncomplicated  EVALUATION COMPLEXITY: Low   GOALS: Goals reviewed with patient? Yes  SHORT TERM GOALS: Target date: 03/25/2023   Pain report to be no greater than 4/10  Baseline: Goal status: In progress  2.  Patient will be independent with initial HEP  Baseline:  Goal status: In progress   LONG TERM GOALS: Target date: 04/22/2023   Patient to report pain no greater than 2/10  Baseline:  Goal status: INITIAL  2.  Patient to be independent with advanced HEP  Baseline:  Goal status: INITIAL  3.  Patient to be able to stand or walk for at least 15 min without leg pain  Baseline:  Goal status: INITIAL  4.  Radicular symptoms to  be centralized Baseline:  Goal status: INITIAL  5.  Patient to report 85% improvement in overall symptoms  Baseline:  Goal status: INITIAL  6.  Patient to be able to continue working Baseline:  Goal status: INITIAL  PLAN:  PT FREQUENCY: 1-2x/week  PT DURATION: 8 weeks  PLANNED INTERVENTIONS: Therapeutic exercises, Therapeutic activity, Neuromuscular re-education, Balance training, Gait training, Patient/Family education, Self Care, Joint mobilization, Stair training, Aquatic Therapy, Dry Needling, Cryotherapy, Moist heat, Manual therapy, and Re-evaluation.  PLAN FOR NEXT SESSION: Hold on any further PT at this time due to inconsistent symptoms possibly related to cardiovascular issues.     Delon  B. Orville Mena, PT 03/13/24 5:18 PM [facility] [address] [place], [place] [address] Phone # [phone] Fax [phone]

## 2023-03-23 LAB — CBC
Hematocrit: 30.4 % — ABNORMAL LOW (ref 34.0–46.6)
Hemoglobin: 9.9 g/dL — ABNORMAL LOW (ref 11.1–15.9)
MCH: 31.1 pg (ref 26.6–33.0)
MCHC: 32.6 g/dL (ref 31.5–35.7)
MCV: 96 fL (ref 79–97)
Platelets: 311 10*3/uL (ref 150–450)
RBC: 3.18 x10E6/uL — ABNORMAL LOW (ref 3.77–5.28)
RDW: 11.9 % (ref 11.7–15.4)
WBC: 15 10*3/uL — ABNORMAL HIGH (ref 3.4–10.8)

## 2023-03-23 LAB — GLUCOSE TOLERANCE, 2 HOURS W/ 1HR
Glucose, 1 hour: 175 mg/dL (ref 70–179)
Glucose, 2 hour: 154 mg/dL — ABNORMAL HIGH (ref 70–152)
Glucose, Fasting: 94 mg/dL — ABNORMAL HIGH (ref 70–91)

## 2023-03-23 LAB — RPR: RPR Ser Ql: NONREACTIVE

## 2023-03-23 LAB — HIV ANTIBODY (ROUTINE TESTING W REFLEX): HIV Screen 4th Generation wRfx: NONREACTIVE

## 2023-03-24 ENCOUNTER — Ambulatory Visit (HOSPITAL_BASED_OUTPATIENT_CLINIC_OR_DEPARTMENT_OTHER): Payer: 59 | Admitting: *Deleted

## 2023-03-24 ENCOUNTER — Ambulatory Visit: Payer: 59 | Attending: Obstetrics

## 2023-03-24 ENCOUNTER — Other Ambulatory Visit: Payer: Self-pay | Admitting: Obstetrics

## 2023-03-24 ENCOUNTER — Ambulatory Visit: Payer: 59 | Admitting: *Deleted

## 2023-03-24 VITALS — BP 130/70 | HR 112

## 2023-03-24 DIAGNOSIS — Z3A28 28 weeks gestation of pregnancy: Secondary | ICD-10-CM | POA: Diagnosis not present

## 2023-03-24 DIAGNOSIS — O30043 Twin pregnancy, dichorionic/diamniotic, third trimester: Secondary | ICD-10-CM

## 2023-03-24 DIAGNOSIS — O361130 Maternal care for Anti-A sensitization, third trimester, not applicable or unspecified: Secondary | ICD-10-CM | POA: Diagnosis not present

## 2023-03-24 DIAGNOSIS — O0993 Supervision of high risk pregnancy, unspecified, third trimester: Secondary | ICD-10-CM

## 2023-03-24 DIAGNOSIS — O365931 Maternal care for other known or suspected poor fetal growth, third trimester, fetus 1: Secondary | ICD-10-CM | POA: Diagnosis not present

## 2023-03-24 NOTE — Procedures (Signed)
[name] 1990/08/05 [redacted]w[redacted]d   Fetus B Non-Stress Test Interpretation for 09/26/24NST with UAD  Indication:  DiDi twins  Fetal Heart Rate Fetus B Mode: External Baseline Rate (B): 145 BPM Variability: Moderate Accelerations: 10 x 10  Uterine Activity Mode: Toco Contraction Frequency (min): none Resting Tone Palpated: Relaxed  Interpretation (Baby B - Fetal Testing) Nonstress Test Interpretation (Baby B): Reactive Comments (Baby B): Tracing reviewed by Dr. Percell Boston [name] Mar 11, 1991 [redacted]w[redacted]d  Fetus A Non-Stress Test Interpretation for 03/24/23-NST with UAD  Indication:  Mercie Eon twins  Fetal Heart Rate A Mode: External Baseline Rate (A): 150 bpm Variability: Moderate Accelerations: 10 x 10 Decelerations: None Multiple birth?: Yes  Uterine Activity Mode: Toco Contraction Frequency (min): none Resting Tone Palpated: Relaxed  Interpretation (Fetal Testing) Nonstress Test Interpretation: Reactive Comments: Tracing reviewed byDr. [name]

## 2023-03-25 DIAGNOSIS — O365990 Maternal care for other known or suspected poor fetal growth, unspecified trimester, not applicable or unspecified: Secondary | ICD-10-CM | POA: Insufficient documentation

## 2023-03-25 DIAGNOSIS — O24419 Gestational diabetes mellitus in pregnancy, unspecified control: Secondary | ICD-10-CM | POA: Insufficient documentation

## 2023-03-28 NOTE — Progress Notes (Signed)
Patient failed 2 hr and needs referral to diabetes educator

## 2023-03-29 ENCOUNTER — Other Ambulatory Visit: Payer: Self-pay

## 2023-03-29 DIAGNOSIS — O24419 Gestational diabetes mellitus in pregnancy, unspecified control: Secondary | ICD-10-CM

## 2023-03-29 MED ORDER — ACCU-CHEK GUIDE W/DEVICE KIT
1.0000 | PACK | Freq: Every day | 0 refills | Status: DC
Start: 2023-03-29 — End: 2023-04-01

## 2023-03-29 MED ORDER — ACCU-CHEK GUIDE VI STRP
ORAL_STRIP | 12 refills | Status: DC
Start: 2023-03-29 — End: 2023-04-11

## 2023-03-29 MED ORDER — ACCU-CHEK SOFTCLIX LANCETS MISC
100.0000 | Freq: Four times a day (QID) | 12 refills | Status: DC
Start: 2023-03-29 — End: 2023-04-11

## 2023-03-31 ENCOUNTER — Ambulatory Visit (INDEPENDENT_AMBULATORY_CARE_PROVIDER_SITE_OTHER): Payer: 59

## 2023-03-31 ENCOUNTER — Other Ambulatory Visit: Payer: Self-pay | Admitting: Cardiology

## 2023-03-31 ENCOUNTER — Ambulatory Visit: Payer: 59 | Attending: Cardiology | Admitting: Cardiology

## 2023-03-31 VITALS — BP 124/62 | HR 106 | Ht 62.0 in | Wt 195.2 lb

## 2023-03-31 DIAGNOSIS — R42 Dizziness and giddiness: Secondary | ICD-10-CM | POA: Diagnosis not present

## 2023-03-31 DIAGNOSIS — R55 Syncope and collapse: Secondary | ICD-10-CM | POA: Diagnosis not present

## 2023-03-31 NOTE — Progress Notes (Unsigned)
ZIO AT serial # [id number] from office inventory applied to patient.

## 2023-03-31 NOTE — Progress Notes (Signed)
Cardio-Obstetrics Clinic  New Evaluation  Date:  03/31/2023   ID:  [name], DOB 03-21-1991, MRN [id number]  PCP:  Warden Fillers, MD   [facility] Providers Cardiologist:  Thomasene Ripple, DO  Electrophysiologist:  None       Referring MD: Catalina Antigua, MD   Chief Complaint: " I passed out on September 9,2024"  History of Present Illness:    [name] is a 32 y.o. female [[id number]] who is being seen today for the evaluation of syncope at the request of Constant, Peggy, MD.   Referred as she has been experiencing dizziness during his pregnancy.  Medical history includes hyperlipidemia from her lipid profile which was done in August 2020 and showed total cholesterol 220, HDL 41, LDL 146, triglyceride 166.  She is currently [redacted] weeks pregnant.  The patient, a pregnant individual with twins in her second trimester, presents with dizziness and syncope. This is her third pregnancy, with previous pregnancies resulting in vaginal deliveries and no similar symptoms. The dizziness began in the second trimester and has resulted in at least one episode of syncope. The patient describes an episode where she was in a store, began seeing black dots, and then lost consciousness, waking up on the floor of the store. This occurred on September 9th. She denies any known family history of heart disease, but mentions a possible history of heart problems in her grandfather. She has been drinking a lot of water, as advised, to help with the dizziness. She has been monitoring her blood pressure, which she reports as being 'really enormous,' but does not recall the specific numbers.   Prior CV Studies Reviewed: The following studies were reviewed today: None   Past Medical History:  Diagnosis Date   BV (bacterial vaginosis)    Trichomonas     Past Surgical History:  Procedure Laterality Date   DILATION AND CURETTAGE OF UTERUS        OB History     Gravida  7   Para  2    Term  2   Preterm  0   AB  4   Living  2      SAB  3   IAB  1   Ectopic  0   Multiple  0   Live Births  2               Current Medications: Current Meds  Medication Sig   Accu-Chek Softclix Lancets lancets 100 each by Other route 4 (four) times daily.   aspirin 81 MG chewable tablet Chew 1 tablet (81 mg total) by mouth daily. Start at 15 weeks   Blood Glucose Monitoring Suppl (ACCU-CHEK GUIDE) w/Device KIT 1 kit by Does not apply route daily.   cyclobenzaprine (FLEXERIL) 10 MG tablet Take 1 tablet (10 mg total) by mouth 2 (two) times daily as needed for muscle spasms.   Doxylamine-Pyridoxine (DICLEGIS) 10-10 MG TBEC Take 2 tablets by mouth at bedtime. If symptoms persist, add one tablet in the morning and one in the afternoon   glucose blood (ACCU-CHEK GUIDE) test strip Use as instructed   Prenatal MV & Min w/FA-DHA (PRENATAL GUMMIES PO) Take 2 tablets by mouth daily.     Allergies:   Latex and Sulfa antibiotics   Social History   Socioeconomic History   Marital status: Single    Spouse name: Not on file   Number of children: Not on file   Years of education: Not on  file   Highest education level: Not on file  Occupational History   Not on file  Tobacco Use   Smoking status: Former    Current packs/day: 0.00    Types: Cigarettes    Quit date: 04/02/2013    Years since quitting: 10.0   Smokeless tobacco: Never  Vaping Use   Vaping status: Never Used  Substance and Sexual Activity   Alcohol use: Not Currently    Comment: occasionally before pregnancy   Drug use: No   Sexual activity: Yes    Partners: Male    Birth control/protection: None    Comment: currently pregnant  Other Topics Concern   Not on file  Social History Narrative   Not on file   Social Determinants of Health   Financial Resource Strain: Not on file  Food Insecurity: Not on file  Transportation Needs: Not on file  Physical Activity: Not on file  Stress: Not on file  Social  Connections: Not on file      Family History  Problem Relation Age of Onset   Kidney Stones Mother    Healthy Father    Cancer Brother    Diabetes Brother    Hypertension Maternal Grandfather    Heart disease Neg Hx       ROS:   Please see the history of present illness.     All other systems reviewed and are negative.   Labs/EKG Reviewed:    EKG:   EKG  ordered today.  The ekg ordered today demonstrates sinus tachycardia  Recent Labs: 03/07/2023: ALT 16; BUN 5; Creatinine, Ser 0.50; Potassium 3.6; Sodium 130 03/22/2023: Hemoglobin 9.9; Platelets 311   Recent Lipid Panel Lab Results  Component Value Date/Time   CHOL 220 (H) 02/19/2019 04:20 PM   TRIG 166 (H) 02/19/2019 04:20 PM   HDL 41 02/19/2019 04:20 PM   CHOLHDL 5.4 (H) 02/19/2019 04:20 PM   LDLCALC 146 (H) 02/19/2019 04:20 PM    Physical Exam:    VS:  BP 124/62   Pulse (!) 106   Ht 5\' 2"  (1.575 m)   Wt 195 lb 3.2 oz (88.5 kg)   LMP 09/06/2022   SpO2 99%   BMI 35.70 kg/m     Wt Readings from Last 3 Encounters:  03/31/23 195 lb 3.2 oz (88.5 kg)  03/22/23 189 lb (85.7 kg)  03/08/23 188 lb (85.3 kg)     GEN:  Well nourished, well developed in no acute distress HEENT: Normal NECK: No JVD; No carotid bruits LYMPHATICS: No lymphadenopathy CARDIAC: RRR, 2/6 holosystolic murmurs, rubs, gallops RESPIRATORY:  Clear to auscultation without rales, wheezing or rhonchi  ABDOMEN: Soft, non-tender, non-distended MUSCULOSKELETAL:  No edema; No deformity  SKIN: Warm and dry NEUROLOGIC:  Alert and oriented x 3 PSYCHIATRIC:  Normal affect    Risk Assessment/Risk Calculators:     CARPREG II Risk Prediction Index Score:  2.  The patient's risk for a primary cardiac event is 10%.            ASSESSMENT & PLAN:    Syncope in Pregnancy Patient in her third pregnancy with twins, currently in the second trimester, experienced a syncopal episode in a grocery store on September 9th. No prior history of syncope.  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 A heart murmur was noted on examination, likely a flow murmur related to pregnancy. -Order an echocardiogram to evaluate cardiac structure  and function. -Apply a heart monitor for one week to evaluate for any arrhythmias. -Follow-up in six weeks to assess the patient's condition. -Advise the patient to increase salt intake with saltine crackers to help retain fluids. -If the monitor cannot be applied today, it will be mailed to the patient's home.   Patient Instructions  Medication Instructions:  Your physician recommends that you continue on your current medications as directed. Please refer to the Current Medication list given to you today.  *If you need a refill on your cardiac medications before your next appointment, please call your pharmacy*   Lab Work: None   Testing/Procedures: Your physician has requested that you have an echocardiogram - OB. Echocardiography is a painless test that uses sound waves to create images of your heart. It provides your doctor with information about the size and shape of your heart and how well your heart's chambers and valves are working. This procedure takes approximately one hour. There are no restrictions for this procedure. Please do NOT wear cologne, perfume, aftershave, or lotions (deodorant is allowed). Please arrive 15 minutes prior to your appointment time.  ZIO AT Long term monitor-Live Telemetry  Your physician has requested you wear a ZIO patch monitor for 14 days.  This is a single patch monitor. [employer] supplies one patch monitor per enrollment. Additional  stickers are not available.  Please do not apply patch if you will be having a Nuclear Stress Test, Echocardiogram, Cardiac CT, MRI,  or Chest Xray during the period you would be wearing the monitor. The patch cannot be worn during   these tests. You cannot remove and re-apply the ZIO AT patch monitor.  Your ZIO patch monitor will be mailed 3 day USPS to your address on file. It may take 3-5 days to  receive your monitor after you have been enrolled.  Once you have received your monitor, please review the enclosed instructions. Your monitor has  already been registered assigning a specific monitor serial # to you.   Billing and Patient Assistance Program information  Meredeth Ide has been supplied with any insurance information on record for billing. [employer] offers a sliding scale Patient Assistance Program for patients without insurance, or whose  insurance does not completely cover the cost of the ZIO patch monitor. You must apply for the  Patient Assistance Program to qualify for the discounted rate. To apply, call [employer] at [phone],  select option 4, select option 2 , ask to apply for the Patient Assistance Program, (you can request an  interpreter if needed). [employer] will ask your household income and how many people are in your  household. [employer] will quote your out-of-pocket cost based on this information. They will also be able  to set up a 12 month interest free payment plan if needed.  Applying the monitor   Shave hair from upper left chest.  Hold the abrader disc by orange tab. Rub the abrader in 40 strokes over left upper chest as indicated in  your monitor instructions.  Clean area with 4 enclosed alcohol pads. Use all pads to ensure the area is cleaned thoroughly. Let  dry.  Apply patch as indicated in monitor instructions. Patch will be placed under collarbone on left side of  chest with arrow pointing upward.  Rub patch adhesive wings for 2 minutes. Remove the white label marked "1". Remove the white label  marked "2". Rub patch adhesive wings for 2 additional minutes.  While looking in a mirror, press and release  button in center of patch. A small green light will flash 3-4  times. This will be  your only indicator that the monitor has been turned on.  Do not shower for the first 24 hours. You may shower after the first 24 hours.  Press the button if you feel a symptom. You will hear a small click. Record Date, Time and Symptom in  the Patient Log.   Starting the Gateway  In your kit there is a Audiological scientist box the size of a cellphone. This is Buyer, retail. It transmits all your  recorded data to [facility]. This box must always stay within 10 feet of you. Open the box and push the *  button. There will be a light that blinks orange and then green a few times. When the light stops  blinking, the Gateway is connected to the ZIO patch. Call [employer] at [phone] to confirm your monitor is transmitting.  Returning your monitor  Remove your patch and place it inside the Gateway. In the lower half of the Gateway there is a white  bag with prepaid postage on it. Place Gateway in bag and seal. Mail package back to [employer] as soon as  possible. Your physician should have your final report approximately 7 days after you have mailed back  your monitor. Call [facility] Customer Care at [phone] if you have questions regarding your ZIO AT  patch monitor. Call them immediately if you see an orange light blinking on your monitor.  If your monitor falls off in less than 4 days, contact our Monitor department at [phone]. If your  monitor becomes loose or falls off after 4 days call [employer] at [phone] for suggestions on  securing your monitor    Follow-Up: At [facility], you and your health needs are our priority.  As part of our continuing mission to provide you with exceptional heart care, we have created designated Provider Care Teams.  These Care Teams include your primary Cardiologist (physician) and Advanced Practice Providers (APPs -  Physician Assistants and Nurse Practitioners) who all work together to provide you with the care you need, when you  need it.  Your next appointment:   6 week(s)  Provider:   Thomasene Ripple, DO     Other Instructions Please eat 4-6 saltine crackers in the morning and 2-4 at lunch. Please maintain high fluid volume.        Dispo:  No follow-ups on file.   Medication Adjustments/Labs and Tests Ordered: Current medicines are reviewed at length with the patient today.  Concerns regarding medicines are outlined above.  Tests Ordered: Orders Placed This Encounter  Procedures   LONG TERM MONITOR-LIVE TELEMETRY (3-14 DAYS)   EKG 12-Lead   ECHOCARDIOGRAM COMPLETE   Medication Changes: No orders of the defined types were placed in this encounter.

## 2023-03-31 NOTE — Patient Instructions (Addendum)
Medication Instructions:  Your physician recommends that you continue on your current medications as directed. Please refer to the Current Medication list given to you today.  *If you need a refill on your cardiac medications before your next appointment, please call your pharmacy*   Lab Work: None   Testing/Procedures: Your physician has requested that you have an echocardiogram - OB. Echocardiography is a painless test that uses sound waves to create images of your heart. It provides your doctor with information about the size and shape of your heart and how well your heart's chambers and valves are working. This procedure takes approximately one hour. There are no restrictions for this procedure. Please do NOT wear cologne, perfume, aftershave, or lotions (deodorant is allowed). Please arrive 15 minutes prior to your appointment time.  ZIO AT Long term monitor-Live Telemetry  Your physician has requested you wear a ZIO patch monitor for 14 days.  This is a single patch monitor. [employer] supplies one patch monitor per enrollment. Additional  stickers are not available.  Please do not apply patch if you will be having a Nuclear Stress Test, Echocardiogram, Cardiac CT, MRI,  or Chest Xray during the period you would be wearing the monitor. The patch cannot be worn during  these tests. You cannot remove and re-apply the ZIO AT patch monitor.  Your ZIO patch monitor will be mailed 3 day USPS to your address on file. It may take 3-5 days to  receive your monitor after you have been enrolled.  Once you have received your monitor, please review the enclosed instructions. Your monitor has  already been registered assigning a specific monitor serial # to you.   Billing and Patient Assistance Program information  Meredeth Ide has been supplied with any insurance information on record for billing. [employer] offers a sliding scale Patient Assistance Program for patients without insurance, or whose   insurance does not completely cover the cost of the ZIO patch monitor. You must apply for the  Patient Assistance Program to qualify for the discounted rate. To apply, call [employer] at [phone],  select option 4, select option 2 , ask to apply for the Patient Assistance Program, (you can request an  interpreter if needed). [employer] will ask your household income and how many people are in your  household. [employer] will quote your out-of-pocket cost based on this information. They will also be able  to set up a 12 month interest free payment plan if needed.  Applying the monitor   Shave hair from upper left chest.  Hold the abrader disc by orange tab. Rub the abrader in 40 strokes over left upper chest as indicated in  your monitor instructions.  Clean area with 4 enclosed alcohol pads. Use all pads to ensure the area is cleaned thoroughly. Let  dry.  Apply patch as indicated in monitor instructions. Patch will be placed under collarbone on left side of  chest with arrow pointing upward.  Rub patch adhesive wings for 2 minutes. Remove the white label marked "1". Remove the white label  marked "2". Rub patch adhesive wings for 2 additional minutes.  While looking in a mirror, press and release button in center of patch. A small green light will flash 3-4  times. This will be your only indicator that the monitor has been turned on.  Do not shower for the first 24 hours. You may shower after the first 24 hours.  Press the button if you feel a symptom. You will hear a  small click. Record Date, Time and Symptom in  the Patient Log.   Starting the Gateway  In your kit there is a Audiological scientist box the size of a cellphone. This is Buyer, retail. It transmits all your  recorded data to [facility]. This box must always stay within 10 feet of you. Open the box and push the *  button. There will be a light that blinks orange and then green a few times. When the light stops  blinking, the Gateway is  connected to the ZIO patch. Call [employer] at [phone] to confirm your monitor is transmitting.  Returning your monitor  Remove your patch and place it inside the Gateway. In the lower half of the Gateway there is a white  bag with prepaid postage on it. Place Gateway in bag and seal. Mail package back to [place] as soon as  possible. Your physician should have your final report approximately 7 days after you have mailed back  your monitor. Call [facility] Customer Care at [phone] if you have questions regarding your ZIO AT  patch monitor. Call them immediately if you see an orange light blinking on your monitor.  If your monitor falls off in less than 4 days, contact our Monitor department at [phone]. If your  monitor becomes loose or falls off after 4 days call [employer] at [phone] for suggestions on  securing your monitor    Follow-Up: At [facility], you and your health needs are our priority.  As part of our continuing mission to provide you with exceptional heart care, we have created designated Provider Care Teams.  These Care Teams include your primary Cardiologist (physician) and Advanced Practice Providers (APPs -  Physician Assistants and Nurse Practitioners) who all work together to provide you with the care you need, when you need it.  Your next appointment:   6 week(s)  Provider:   Thomasene Ripple, DO     Other Instructions Please eat 4-6 saltine crackers in the morning and 2-4 at lunch. Please maintain high fluid volume.

## 2023-04-01 ENCOUNTER — Other Ambulatory Visit: Payer: Self-pay | Admitting: *Deleted

## 2023-04-01 ENCOUNTER — Other Ambulatory Visit: Payer: Self-pay | Admitting: Obstetrics & Gynecology

## 2023-04-01 ENCOUNTER — Ambulatory Visit: Payer: 59

## 2023-04-01 ENCOUNTER — Ambulatory Visit: Payer: 59 | Attending: Obstetrics

## 2023-04-01 ENCOUNTER — Ambulatory Visit: Payer: 59 | Admitting: *Deleted

## 2023-04-01 VITALS — BP 117/79 | HR 105

## 2023-04-01 DIAGNOSIS — O0993 Supervision of high risk pregnancy, unspecified, third trimester: Secondary | ICD-10-CM | POA: Insufficient documentation

## 2023-04-01 DIAGNOSIS — O365920 Maternal care for other known or suspected poor fetal growth, second trimester, not applicable or unspecified: Secondary | ICD-10-CM | POA: Insufficient documentation

## 2023-04-01 DIAGNOSIS — O365931 Maternal care for other known or suspected poor fetal growth, third trimester, fetus 1: Secondary | ICD-10-CM

## 2023-04-01 DIAGNOSIS — O30043 Twin pregnancy, dichorionic/diamniotic, third trimester: Secondary | ICD-10-CM | POA: Insufficient documentation

## 2023-04-01 DIAGNOSIS — O361130 Maternal care for Anti-A sensitization, third trimester, not applicable or unspecified: Secondary | ICD-10-CM | POA: Diagnosis not present

## 2023-04-01 DIAGNOSIS — O24410 Gestational diabetes mellitus in pregnancy, diet controlled: Secondary | ICD-10-CM | POA: Diagnosis not present

## 2023-04-01 DIAGNOSIS — O30002 Twin pregnancy, unspecified number of placenta and unspecified number of amniotic sacs, second trimester: Secondary | ICD-10-CM | POA: Insufficient documentation

## 2023-04-01 DIAGNOSIS — Z3A29 29 weeks gestation of pregnancy: Secondary | ICD-10-CM | POA: Diagnosis not present

## 2023-04-01 DIAGNOSIS — O24419 Gestational diabetes mellitus in pregnancy, unspecified control: Secondary | ICD-10-CM

## 2023-04-01 DIAGNOSIS — O365930 Maternal care for other known or suspected poor fetal growth, third trimester, not applicable or unspecified: Secondary | ICD-10-CM

## 2023-04-01 DIAGNOSIS — O365990 Maternal care for other known or suspected poor fetal growth, unspecified trimester, not applicable or unspecified: Secondary | ICD-10-CM | POA: Diagnosis present

## 2023-04-04 MED ORDER — ACCU-CHEK GUIDE W/DEVICE KIT: 1.0000 | PACK | Freq: Every day | 0 refills | Status: DC

## 2023-04-06 ENCOUNTER — Inpatient Hospital Stay (HOSPITAL_COMMUNITY)
Admission: AD | Admit: 2023-04-06 | Discharge: 2023-04-06 | Disposition: A | Discharge status: Home / Self Care | Payer: 59 | Attending: Obstetrics and Gynecology | Admitting: Obstetrics and Gynecology

## 2023-04-06 ENCOUNTER — Ambulatory Visit (INDEPENDENT_AMBULATORY_CARE_PROVIDER_SITE_OTHER): Payer: 59 | Admitting: Obstetrics and Gynecology

## 2023-04-06 ENCOUNTER — Encounter (HOSPITAL_COMMUNITY): Payer: Self-pay | Admitting: Obstetrics and Gynecology

## 2023-04-06 ENCOUNTER — Encounter: Payer: Self-pay | Admitting: Obstetrics and Gynecology

## 2023-04-06 VITALS — BP 133/82 | HR 113 | Wt 197.3 lb

## 2023-04-06 DIAGNOSIS — Z3689 Encounter for other specified antenatal screening: Secondary | ICD-10-CM

## 2023-04-06 DIAGNOSIS — O99891 Other specified diseases and conditions complicating pregnancy: Secondary | ICD-10-CM | POA: Diagnosis not present

## 2023-04-06 DIAGNOSIS — N760 Acute vaginitis: Secondary | ICD-10-CM

## 2023-04-06 DIAGNOSIS — O368130 Decreased fetal movements, third trimester, not applicable or unspecified: Secondary | ICD-10-CM | POA: Diagnosis present

## 2023-04-06 DIAGNOSIS — O24419 Gestational diabetes mellitus in pregnancy, unspecified control: Secondary | ICD-10-CM | POA: Insufficient documentation

## 2023-04-06 DIAGNOSIS — M5440 Lumbago with sciatica, unspecified side: Secondary | ICD-10-CM | POA: Insufficient documentation

## 2023-04-06 DIAGNOSIS — O0993 Supervision of high risk pregnancy, unspecified, third trimester: Secondary | ICD-10-CM

## 2023-04-06 DIAGNOSIS — Z3A30 30 weeks gestation of pregnancy: Secondary | ICD-10-CM | POA: Diagnosis not present

## 2023-04-06 DIAGNOSIS — O368131 Decreased fetal movements, third trimester, fetus 1: Secondary | ICD-10-CM | POA: Diagnosis not present

## 2023-04-06 DIAGNOSIS — O365990 Maternal care for other known or suspected poor fetal growth, unspecified trimester, not applicable or unspecified: Secondary | ICD-10-CM

## 2023-04-06 DIAGNOSIS — Z0371 Encounter for suspected problem with amniotic cavity and membrane ruled out: Secondary | ICD-10-CM

## 2023-04-06 DIAGNOSIS — O30041 Twin pregnancy, dichorionic/diamniotic, first trimester: Secondary | ICD-10-CM

## 2023-04-06 LAB — URINALYSIS, ROUTINE W REFLEX MICROSCOPIC
Bilirubin Urine: NEGATIVE
Glucose, UA: NEGATIVE mg/dL
Hgb urine dipstick: NEGATIVE
Ketones, ur: NEGATIVE mg/dL
Nitrite: NEGATIVE
Protein, ur: 30 mg/dL — AB
Specific Gravity, Urine: 1.023 (ref 1.005–1.030)
pH: 5 (ref 5.0–8.0)

## 2023-04-06 LAB — WET PREP, GENITAL
Sperm: NONE SEEN
Trich, Wet Prep: NONE SEEN
WBC, Wet Prep HPF POC: >=10 — AB (ref <10)
Yeast Wet Prep HPF POC: NONE SEEN

## 2023-04-06 LAB — GLUCOSE, CAPILLARY: Glucose-Capillary: 103 mg/dL — ABNORMAL HIGH (ref 70–99)

## 2023-04-06 MED ORDER — METRONIDAZOLE 0.75 % VA GEL
1.0000 | Freq: Every day | VAGINAL | 1 refills | Status: DC
Start: 2023-04-06 — End: 2023-04-29

## 2023-04-06 NOTE — MAU Note (Signed)
..  [name] is a 32 y.o. at [redacted]w[redacted]d here in MAU reporting: sent from the office because she has not felt baby move for x2 days. She also reports leaking clear watery fluid x1 week. Denies VB. Patient also states she's feeling irregular contractions today.   Pain score: 7 Vitals:   04/06/23 1116  BP: 118/75  Pulse: (!) 104  Resp: 14  Temp: (!) 97.5 F (36.4 C)  SpO2: 99%     FHT:a- 144   b-152 Lab orders placed from triage:   UA

## 2023-04-06 NOTE — Progress Notes (Signed)
PRENATAL VISIT NOTE  Pt states she has not felt Baby B move in 1-2 days. States she is normally able to feel him move. She also reports occasional contractions. Denies vaginal spotting or bleeding. Denies LOF from vagina. Pt agrees to report to MAU after appointment for further assessment/monitoring.   Subjective:  [name] is a 32 y.o. [id number] at [redacted]w[redacted]d being seen today for ongoing prenatal care.  She is currently monitored for the following issues for this high-risk pregnancy and has Uterine mass; Dichorionic diamniotic twin pregnancy in first trimester; Supervision of high-risk pregnancy; Anti -Lewis a antibody positive; Fetal growth restriction antepartum; and GDM (gestational diabetes mellitus) on their problem list.  Patient reports no bleeding and no leaking.  Contractions: Irritability. Vag. Bleeding: None.  Movement: Present (says girl is moving a lot but the boy seems like he has decreased). Denies leaking of fluid.   The following portions of the patient's history were reviewed and updated as appropriate: allergies, current medications, past family history, past medical history, past social history, past surgical history and problem list.   Objective:   Vitals:   04/06/23 0923  BP: 133/82  Pulse: (!) 113  Weight: 197 lb 4.8 oz (89.5 kg)    Fetal Status: Fetal Heart Rate (bpm): A 155 B 153 (Baby A 155  Baby B 153)   Movement: Present (says girl is moving a lot but the boy seems like he has decreased)     General:  Alert, oriented and cooperative. Patient is in no acute distress.  Skin: Skin is warm and dry. No rash noted.   Cardiovascular: Normal heart rate noted  Respiratory: Normal respiratory effort, no problems with respiration noted  Abdomen: Soft, gravid, appropriate for gestational age.  Pain/Pressure: Present     Pelvic: Cervical exam deferred        Extremities: Normal range of motion.  Edema: Trace  Mental Status: Normal mood and affect. Normal behavior.  Normal judgment and thought content.   Assessment and Plan:  Pregnancy: [id number] at [redacted]w[redacted]d 1. [redacted] weeks gestation of pregnancy (Decreased fetal movement) -NST Reactive. Pt continued to report that she was unable to feel Baby B move-sent to MAU for monitoring.  2. Dichorionic diamniotic twin pregnancy in first trimester Followed by MFM  3. Supervision of high risk pregnancy in third trimester Followed by MFM Continue PNV, ASA 81mg  po once daily  4. IUGR Twin A Follow-up US scheduled.  5. GDM -Pt states she hasn't picked up glucometer from pharmacy yet but hopes to pick it up today. Healthy diet encouraged.   Preterm labor symptoms and general obstetric precautions including but not limited to vaginal bleeding, contractions, leaking of fluid and fetal movement were reviewed in detail with the patient. Please refer to After Visit Summary for other counseling recommendations.   No follow-ups on file.  Future Appointments  Date Time Provider Department Center  04/08/2023  8:15 AM WMC-MFC NURSE [facility]  04/08/2023  8:30 AM WMC[facility]MFC US4 [facility]  04/08/2023 10:45 AM WMC-MFC NST [facility]MFC [facility]  04/13/2023  9:00 AM NDM-NMCH GDM CLASS NDM-NMCH NDM  04/15/2023  8:15 AM WMC-MFC NURSE WMC-MFC [facility]  04/15/2023  8:30 AM WMC-MFC US5 WMC[facility]  04/15/2023  9:45 AM WMC-MFC NST WMC[facility]MFC [name]  04/19/2023  9:35 AM Constant, Peggy, MD [facility] None  04/21/2023  8:15 AM WMC-MFC NURSE WMC[facility]  04/21/2023  8:30 AM WMC-MFC US5 WMC[facility]  04/21/2023 10:45 AM WMC-MFC NST WMC-MFC [facility]  04/22/2023  9:05 AM MC-CV CH ECHO 4 MC[facility]SITE3ECHO [facility]  04/29/2023  1:15 PM WMC-MFC NURSE WMC-MFC [facility]  04/29/2023  1:30 PM WMC-MFC US4 WMC[facility]  04/29/2023  2:15 PM WMC-MFC NST WMC[facility]MFC [facility]  05/04/2023  9:35 AM Warden Fillers, MD [facility] None  05/06/2023  8:15 AM WMC-MFC NURSE WMC-MFC [facility]  05/06/2023  8:30 AM WMC-MFC US2 WMC-MFCUS [facility]  05/06/2023  9:45 AM WMC-MFC NST WMC-MFC [facility]  05/11/2023  9:00 AM  Tobb, Kardie, DO [facility] None  05/17/2023  9:55 AM Leftwich-Kirby, Wilmer Floor, CNM [facility] None  05/24/2023  9:15 AM Leftwich-Kirby, Wilmer Floor, CNM [facility] None   RTO 2 weeks for ROB.  Letta Kocher, CNM

## 2023-04-06 NOTE — MAU Provider Note (Signed)
History     CSN: [id number]  Arrival date and time: 04/06/23 1056   None     Chief Complaint  Patient presents with   Decreased Fetal Movement    [name] is a 32 y.o. [id number] at [redacted]w[redacted]d who receives care at [facility].  She presents today for DFM.  She states she was seen in the office and hadn't felt fetal movement.  She reports she was on the monitor, but was told to come in for further evaluation.  Patient endorses fetal movement currently.  However, she states she has been having multiple gushes of fluid, in the past week, despite position. Last incident while in hospital.  Reports increased vaginal discharge as well. No vaginal bleeding or issues with urination.  Patient also reports lower back pain that shoots down her legs causing numbness.  She states the pain is intermittent, but worsening.  She reports she has been to PT, but discontinued d/t worsening of symptoms with movements.   OB History     Gravida  7   Para  2   Term  2   Preterm  0   AB  4   Living  2      SAB  3   IAB  1   Ectopic  0   Multiple  0   Live Births  2           Past Medical History:  Diagnosis Date   BV (bacterial vaginosis)    Trichomonas     Past Surgical History:  Procedure Laterality Date   DILATION AND CURETTAGE OF UTERUS      Family History  Problem Relation Age of Onset   Kidney Stones Mother    Healthy Father    Cancer Brother    Diabetes Brother    Hypertension Maternal Grandfather    Heart disease Neg Hx     Social History   Tobacco Use   Smoking status: Former    Current packs/day: 0.00    Types: Cigarettes    Quit date: 04/02/2013    Years since quitting: 10.0   Smokeless tobacco: Never  Vaping Use   Vaping status: Never Used  Substance Use Topics   Alcohol use: Not Currently    Comment: occasionally before pregnancy   Drug use: No    Allergies:  Allergies  Allergen Reactions   Latex Rash   Sulfa Antibiotics Hives     Medications Prior to Admission  Medication Sig Dispense Refill Last Dose   aspirin 81 MG chewable tablet Chew 1 tablet (81 mg total) by mouth daily. Start at 15 weeks 30 tablet 5 04/05/2023   Prenatal MV & Min w/FA-DHA (PRENATAL GUMMIES PO) Take 2 tablets by mouth daily.   04/05/2023   Accu-Chek Softclix Lancets lancets 100 each by Other route 4 (four) times daily. 100 each 12    atomoxetine (STRATTERA) 40 MG capsule Take 1 tablet by mouth daily. (Patient not taking: Reported on 11/09/2022)      Blood Glucose Monitoring Suppl (ACCU-CHEK GUIDE) w/Device KIT 1 kit by Does not apply route daily. 1 kit 0    clotrimazole (GYNE-LOTRIMIN) 1 % vaginal cream Place 1 Applicatorful vaginally at bedtime. (Patient not taking: Reported on 03/07/2023) 30 g 2    cyclobenzaprine (FLEXERIL) 10 MG tablet Take 1 tablet (10 mg total) by mouth 2 (two) times daily as needed for muscle spasms. 20 tablet 0    cyclobenzaprine (FLEXERIL) 10 MG tablet Take 1 tablet (10  mg total) by mouth 3 (three) times daily as needed for muscle spasms. (Patient not taking: Reported on 03/31/2023) 30 tablet 2    Doxylamine-Pyridoxine (DICLEGIS) 10-10 MG TBEC Take 2 tablets by mouth at bedtime. If symptoms persist, add one tablet in the morning and one in the afternoon 100 tablet 5    glucose blood (ACCU-CHEK GUIDE) test strip Use as instructed 100 each 12    ondansetron (ZOFRAN-ODT) 4 MG disintegrating tablet Take 1 tablet (4 mg total) by mouth every 6 (six) hours as needed for nausea. (Patient not taking: Reported on 03/07/2023) 60 tablet 1    promethazine (PHENERGAN) 25 MG tablet Take 1 tablet (25 mg total) by mouth every 6 (six) hours as needed for nausea or vomiting. (Patient not taking: Reported on 03/31/2023) 30 tablet 0    scopolamine (TRANSDERM-SCOP) 1 MG/3DAYS Place 1 patch (1.5 mg total) onto the skin every 3 (three) days. (Patient not taking: Reported on 02/08/2023) 10 patch 1     Review of Systems  Constitutional:  Negative for  chills and fever.  Gastrointestinal:  Negative for abdominal pain, nausea and vomiting.  Genitourinary:  Negative for difficulty urinating, dysuria, vaginal bleeding and vaginal discharge.  Neurological:  Positive for dizziness. Negative for light-headedness and headaches.   Physical Exam   Blood pressure 128/79, pulse (!) 102, temperature (!) 97.5 F (36.4 C), temperature source Oral, resp. rate 14, last menstrual period 09/06/2022, SpO2 99%, unknown if currently breastfeeding.  Physical Exam Vitals reviewed. Exam conducted with a chaperone present Jeanice Lim, Charity fundraiser).  Eyes:     Conjunctiva/sclera: Conjunctivae normal.  Cardiovascular:     Rate and Rhythm: Normal rate.  Pulmonary:     Effort: Pulmonary effort is normal. No respiratory distress.  Abdominal:     Palpations: Abdomen is soft.     Tenderness: There is no abdominal tenderness.  Genitourinary:    General: Normal vulva.     Comments: Sterile Speculum Exam: -Normal External Genitalia: Non tender, no apparent discharge at introitus.  -Vaginal Vault: Pink mucosa with good rugae. Moderate amt yellowish watery fluid. -wet prep collected -Cervix:Pink, no lesions, cysts, or polyps.  No active leaking from cervix. Negative Valsalva. Appears closed. No active bleeding from os -Bimanual Exam: Deferred Musculoskeletal:        General: Normal range of motion.  Skin:    General: Skin is warm and dry.  Neurological:     Mental Status: She is alert and oriented to person, place, and time.  Psychiatric:        Mood and Affect: Mood normal.        Behavior: Behavior normal.     Fetal Assessment BA: 145 bpm, Mod Var, -Decels, -Accels BB: 150 bpm, Mod Var, -Decels, +10x10 Accels Toco: No ctx graphed  MAU Course   Results for orders placed or performed during the hospital encounter of 04/06/23 (from the past 24 hour(s))  Urinalysis, Routine w reflex microscopic -Urine, Clean Catch     Status: Abnormal   Collection Time: 04/06/23 11:19  AM  Result Value Ref Range   Color, Urine YELLOW YELLOW   APPearance HAZY (A) CLEAR   Specific Gravity, Urine 1.023 1.005 - 1.030   pH 5.0 5.0 - 8.0   Glucose, UA NEGATIVE NEGATIVE mg/dL   Hgb urine dipstick NEGATIVE NEGATIVE   Bilirubin Urine NEGATIVE NEGATIVE   Ketones, ur NEGATIVE NEGATIVE mg/dL   Protein, ur 30 (A) NEGATIVE mg/dL   Nitrite NEGATIVE NEGATIVE   Leukocytes,Ua SMALL (A) NEGATIVE  RBC / HPF 0-5 0 - 5 RBC/hpf   WBC, UA 11-20 0 - 5 WBC/hpf   Bacteria, UA MANY (A) NONE SEEN   Squamous Epithelial / HPF 6-10 0 - 5 /HPF   Mucus PRESENT    Ca Oxalate Crys, UA PRESENT   Glucose, capillary     Status: Abnormal   Collection Time: 04/06/23 11:54 AM  Result Value Ref Range   Glucose-Capillary 103 (H) 70 - 99 mg/dL  Wet prep, genital     Status: Abnormal   Collection Time: 04/06/23 12:30 PM   Specimen: Vaginal  Result Value Ref Range   Yeast Wet Prep HPF POC NONE SEEN NONE SEEN   Trich, Wet Prep NONE SEEN NONE SEEN   Clue Cells Wet Prep HPF POC PRESENT (A) NONE SEEN   WBC, Wet Prep HPF POC >=10 (A) <10   Sperm NONE SEEN    No results found.  MDM PE Labs: Wet Prep, Fern, CBG, UA EFM Prescription  Assessment and Plan  32 year old [id number]  TIUP at 30.2 weeks Cat I FT DFM Sciatica Pain R/O ROM  -POC Reviewed -Reviewed option of ROM + or speculum exam. -Patient opts for speculum exam.   -Will give heating pads for sciatica pain. -Patient declines pain medication.  -Give heating pad. -Will return, with nurse, to perform speculum exam. -NST reassuring for GA.   Cherre Robins MSN, CNM 04/06/2023, 11:39 AM   Reassessment (11:54 AM) -Provider to bedside to perform pelvic as above. -Wet prep collected and pending. Crist Fat collected and negative  Reassessment (1:14 PM) -Results as above.  -Discussed treatment and options given. Patient opts for metrogel. -Prescription sent to pharmacy on file.  -Precautions reviewed. -Encouraged to call primary office or  return to MAU if symptoms worsen or with the onset of new symptoms. -Discharged to home in stable condition.  Cherre Robins MSN, CNM Advanced Practice Provider, Center for [employer]

## 2023-04-07 LAB — CULTURE, OB URINE

## 2023-04-08 ENCOUNTER — Other Ambulatory Visit: Payer: Self-pay | Admitting: Obstetrics & Gynecology

## 2023-04-08 ENCOUNTER — Ambulatory Visit: Payer: 59

## 2023-04-08 ENCOUNTER — Ambulatory Visit: Payer: 59 | Attending: Obstetrics | Admitting: *Deleted

## 2023-04-08 ENCOUNTER — Ambulatory Visit: Payer: 59 | Admitting: *Deleted

## 2023-04-08 VITALS — BP 126/77 | HR 110

## 2023-04-08 DIAGNOSIS — O30043 Twin pregnancy, dichorionic/diamniotic, third trimester: Secondary | ICD-10-CM | POA: Diagnosis not present

## 2023-04-08 DIAGNOSIS — O365931 Maternal care for other known or suspected poor fetal growth, third trimester, fetus 1: Secondary | ICD-10-CM

## 2023-04-08 DIAGNOSIS — O24410 Gestational diabetes mellitus in pregnancy, diet controlled: Secondary | ICD-10-CM

## 2023-04-08 DIAGNOSIS — Z3A30 30 weeks gestation of pregnancy: Secondary | ICD-10-CM | POA: Insufficient documentation

## 2023-04-08 DIAGNOSIS — O365932 Maternal care for other known or suspected poor fetal growth, third trimester, fetus 2: Secondary | ICD-10-CM | POA: Insufficient documentation

## 2023-04-08 DIAGNOSIS — O24419 Gestational diabetes mellitus in pregnancy, unspecified control: Secondary | ICD-10-CM | POA: Insufficient documentation

## 2023-04-08 DIAGNOSIS — O361130 Maternal care for Anti-A sensitization, third trimester, not applicable or unspecified: Secondary | ICD-10-CM

## 2023-04-08 DIAGNOSIS — O0993 Supervision of high risk pregnancy, unspecified, third trimester: Secondary | ICD-10-CM

## 2023-04-08 NOTE — Procedures (Signed)
[name] [birth year] [redacted]w[redacted]d   Fetus B Non-Stress Test Interpretation for 04/08/23  Indication: Di/Di Twins, Gestational Diabetes, IUGR - A  Fetal Heart Rate Fetus B Mode: External Baseline Rate (B): 150 BPM Variability: Moderate Accelerations: 15 x 15 Decelerations: None  Uterine Activity Mode: Toco Contraction Frequency (min): Occasional Contraction Duration (sec): 40-60 Contraction Quality: Mild Resting Tone Palpated: Relaxed Resting Time: Adequate  Interpretation (Baby B - Fetal Testing) Nonstress Test Interpretation (Baby B): Reactive Comments (Baby B): Tracing reviewed by Dr. Percell Boston [name] 07-08-1990 [redacted]w[redacted]d  Fetus A Non-Stress Test Interpretation for 04/08/23  Indication: Di/Di Twins, Gestational Diabetes, IUGR - A  Fetal Heart Rate A Mode: External Baseline Rate (A): 150 bpm Variability: Moderate Accelerations: 15 x 15 Decelerations: None Multiple birth?: Yes  Uterine Activity Mode: Toco Contraction Frequency (min): Occasional Contraction Duration (sec): 40-60 Contraction Quality: Mild Resting Tone Palpated: Relaxed Resting Time: Adequate  Interpretation (Fetal Testing) Nonstress Test Interpretation: Reactive Overall Impression: Reassuring for gestational age

## 2023-04-11 ENCOUNTER — Other Ambulatory Visit: Payer: Self-pay

## 2023-04-11 DIAGNOSIS — O24419 Gestational diabetes mellitus in pregnancy, unspecified control: Secondary | ICD-10-CM

## 2023-04-11 MED ORDER — ACCU-CHEK GUIDE VI STRP: ORAL_STRIP | 12 refills | Status: DC

## 2023-04-11 MED ORDER — ACCU-CHEK SOFTCLIX LANCETS MISC: 100.0000 | Freq: Four times a day (QID) | 12 refills | Status: DC

## 2023-04-13 ENCOUNTER — Ambulatory Visit: Payer: 59

## 2023-04-15 ENCOUNTER — Ambulatory Visit: Payer: 59 | Admitting: *Deleted

## 2023-04-15 ENCOUNTER — Other Ambulatory Visit: Payer: Self-pay

## 2023-04-15 ENCOUNTER — Other Ambulatory Visit: Payer: Self-pay | Admitting: Obstetrics & Gynecology

## 2023-04-15 ENCOUNTER — Ambulatory Visit: Payer: 59 | Attending: Obstetrics & Gynecology | Admitting: *Deleted

## 2023-04-15 ENCOUNTER — Ambulatory Visit: Payer: 59

## 2023-04-15 VITALS — BP 115/79 | HR 111

## 2023-04-15 DIAGNOSIS — O365931 Maternal care for other known or suspected poor fetal growth, third trimester, fetus 1: Secondary | ICD-10-CM | POA: Insufficient documentation

## 2023-04-15 DIAGNOSIS — O30041 Twin pregnancy, dichorionic/diamniotic, first trimester: Secondary | ICD-10-CM

## 2023-04-15 DIAGNOSIS — O30043 Twin pregnancy, dichorionic/diamniotic, third trimester: Secondary | ICD-10-CM

## 2023-04-15 DIAGNOSIS — O283 Abnormal ultrasonic finding on antenatal screening of mother: Secondary | ICD-10-CM | POA: Insufficient documentation

## 2023-04-15 DIAGNOSIS — O24410 Gestational diabetes mellitus in pregnancy, diet controlled: Secondary | ICD-10-CM

## 2023-04-15 DIAGNOSIS — O358XX0 Maternal care for other (suspected) fetal abnormality and damage, not applicable or unspecified: Secondary | ICD-10-CM | POA: Insufficient documentation

## 2023-04-15 DIAGNOSIS — O24419 Gestational diabetes mellitus in pregnancy, unspecified control: Secondary | ICD-10-CM

## 2023-04-15 DIAGNOSIS — O0993 Supervision of high risk pregnancy, unspecified, third trimester: Secondary | ICD-10-CM

## 2023-04-15 DIAGNOSIS — O365930 Maternal care for other known or suspected poor fetal growth, third trimester, not applicable or unspecified: Secondary | ICD-10-CM

## 2023-04-15 DIAGNOSIS — O361930 Maternal care for other isoimmunization, third trimester, not applicable or unspecified: Secondary | ICD-10-CM | POA: Diagnosis not present

## 2023-04-15 DIAGNOSIS — O403XX1 Polyhydramnios, third trimester, fetus 1: Secondary | ICD-10-CM | POA: Insufficient documentation

## 2023-04-15 DIAGNOSIS — Z3A31 31 weeks gestation of pregnancy: Secondary | ICD-10-CM | POA: Diagnosis not present

## 2023-04-15 NOTE — Progress Notes (Signed)
[name] Jan 24, 1991 [redacted]w[redacted]d   Fetus B Non-Stress Test Interpretation for 04/15/23  Indication:  DIDI   Fetal Heart Rate Fetus B Mode: External Baseline Rate (B): 145 BPM Variability: Moderate Accelerations: 10 x 10 Decelerations: None  Uterine Activity Mode: Toco Contraction Frequency (min): irreg Contraction Duration (sec): 80-90 Contraction Quality: Mild Resting Tone Palpated: Relaxed  Interpretation (Baby B - Fetal Testing) Nonstress Test Interpretation (Baby B): Reactive Comments (Baby B): Tracing reviewed by Dr. Kristopher Glee TAMBRA CARKHUFF [birth year] [redacted]w[redacted]d  Fetus A Non-Stress Test Interpretation for 04/15/23  Indication: IUGR, DIDI  Fetal Heart Rate A Mode: External Baseline Rate (A): 150 bpm Variability: Moderate Accelerations: 10 x 10 Decelerations: None Multiple birth?: Yes  Uterine Activity Mode: Toco Contraction Frequency (min): irreg Contraction Duration (sec): 80-90 Contraction Quality: Mild Resting Tone Palpated: Relaxed  Interpretation (Fetal Testing) Nonstress Test Interpretation: Reactive Comments: Tracing reviewed byDr. [name]

## 2023-04-19 ENCOUNTER — Encounter: Payer: Self-pay | Admitting: Obstetrics and Gynecology

## 2023-04-19 ENCOUNTER — Ambulatory Visit (INDEPENDENT_AMBULATORY_CARE_PROVIDER_SITE_OTHER): Payer: 59 | Admitting: Obstetrics and Gynecology

## 2023-04-19 VITALS — BP 120/79 | HR 97 | Wt 203.0 lb

## 2023-04-19 DIAGNOSIS — O365930 Maternal care for other known or suspected poor fetal growth, third trimester, not applicable or unspecified: Secondary | ICD-10-CM

## 2023-04-19 DIAGNOSIS — O365990 Maternal care for other known or suspected poor fetal growth, unspecified trimester, not applicable or unspecified: Secondary | ICD-10-CM

## 2023-04-19 DIAGNOSIS — O30041 Twin pregnancy, dichorionic/diamniotic, first trimester: Secondary | ICD-10-CM

## 2023-04-19 DIAGNOSIS — O30043 Twin pregnancy, dichorionic/diamniotic, third trimester: Secondary | ICD-10-CM

## 2023-04-19 DIAGNOSIS — Z3A32 32 weeks gestation of pregnancy: Secondary | ICD-10-CM

## 2023-04-19 DIAGNOSIS — O0993 Supervision of high risk pregnancy, unspecified, third trimester: Secondary | ICD-10-CM

## 2023-04-19 DIAGNOSIS — O24419 Gestational diabetes mellitus in pregnancy, unspecified control: Secondary | ICD-10-CM

## 2023-04-19 NOTE — Progress Notes (Signed)
   PRENATAL VISIT NOTE  Subjective:  [name] is a 32 y.o. [id number] at [redacted]w[redacted]d being seen today for ongoing prenatal care.  She is currently monitored for the following issues for this high-risk pregnancy and has Uterine mass; Dichorionic diamniotic twin pregnancy in first trimester; Supervision of high-risk pregnancy; Anti -Lewis a antibody positive; Fetal growth restriction antepartum; and GDM (gestational diabetes mellitus) on their problem list.  Patient reports no complaints.  Contractions: Irritability. Vag. Bleeding: None.  Movement: Present. Denies leaking of fluid.   The following portions of the patient's history were reviewed and updated as appropriate: allergies, current medications, past family history, past medical history, past social history, past surgical history and problem list.   Objective:   Vitals:   04/19/23 0938  BP: 120/79  Pulse: 97  Weight: 203 lb (92.1 kg)    Fetal Status: Fetal Heart Rate (bpm): A;140,B:140   Movement: Present     General:  Alert, oriented and cooperative. Patient is in no acute distress.  Skin: Skin is warm and dry. No rash noted.   Cardiovascular: Normal heart rate noted  Respiratory: Normal respiratory effort, no problems with respiration noted  Abdomen: Soft, gravid, appropriate for gestational age.  Pain/Pressure: Present     Pelvic: Cervical exam deferred        Extremities: Normal range of motion.     Mental Status: Normal mood and affect. Normal behavior. Normal judgment and thought content.   Assessment and Plan:  Pregnancy: [id number] at [redacted]w[redacted]d 1. Supervision of high risk pregnancy in third trimester Patient is doing well without complaints Work note provided requesting hourly 15-minute breaks  2. Dichorionic diamniotic twin pregnancy in first trimester Follow up ultrasound on 10/24  3. Fetal growth restriction antepartum Timing of delivery to be determined pending ultrasounds  4. Gestational diabetes mellitus (GDM) in  third trimester, gestational diabetes method of control unspecified CBGs reviewed and well controlled on diet Congratulated patient on her efforts  Preterm labor symptoms and general obstetric precautions including but not limited to vaginal bleeding, contractions, leaking of fluid and fetal movement were reviewed in detail with the patient. Please refer to After Visit Summary for other counseling recommendations.   Return in about 2 weeks (around 05/03/2023) for in person, ROB, High risk.  Future Appointments  Date Time Provider Department Center  04/21/2023  8:15 AM [facility] NURSE [facility]  04/21/2023  8:30 AM [facility] US5 [facility]  04/21/2023 10:45 AM [facility] NST [facility]  04/22/2023  9:05 AM [facility] ECHO 4 [id number]  04/29/2023  1:15 PM [facility] NURSE [facility]  04/29/2023  1:30 PM [facility] US4 [facility]  04/29/2023  2:15 PM [facility] NST [facility]  05/04/2023  9:35 AM Warden Fillers, MD [facility] None  05/06/2023  8:15 AM [facility] NURSE [facility]  05/06/2023  8:30 AM [facility] US2 [facility]  05/06/2023  9:45 AM [facility] NST [facility]  05/11/2023  9:00 AM Tobb, Kardie, DO [facility] None  05/17/2023  9:55 AM Leftwich-Kirby, Wilmer Floor, CNM [facility] None  05/24/2023  9:15 AM Leftwich-Kirby, Wilmer Floor, CNM [facility] None    Catalina Antigua, MD

## 2023-04-19 NOTE — Progress Notes (Signed)
Pt complains of leg numbness and cramping. Pt states blood sugars controlled.  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   Pt would also like to know delivery timing.

## 2023-04-21 ENCOUNTER — Other Ambulatory Visit: Payer: Self-pay

## 2023-04-21 ENCOUNTER — Ambulatory Visit: Payer: 59 | Admitting: *Deleted

## 2023-04-21 ENCOUNTER — Ambulatory Visit: Payer: 59 | Attending: Obstetrics

## 2023-04-21 VITALS — BP 122/78 | HR 98

## 2023-04-21 DIAGNOSIS — O365931 Maternal care for other known or suspected poor fetal growth, third trimester, fetus 1: Secondary | ICD-10-CM

## 2023-04-21 DIAGNOSIS — O24419 Gestational diabetes mellitus in pregnancy, unspecified control: Secondary | ICD-10-CM | POA: Diagnosis present

## 2023-04-21 DIAGNOSIS — O24410 Gestational diabetes mellitus in pregnancy, diet controlled: Secondary | ICD-10-CM

## 2023-04-21 DIAGNOSIS — O365930 Maternal care for other known or suspected poor fetal growth, third trimester, not applicable or unspecified: Secondary | ICD-10-CM | POA: Insufficient documentation

## 2023-04-21 DIAGNOSIS — O403XX1 Polyhydramnios, third trimester, fetus 1: Secondary | ICD-10-CM | POA: Diagnosis not present

## 2023-04-21 DIAGNOSIS — O30043 Twin pregnancy, dichorionic/diamniotic, third trimester: Secondary | ICD-10-CM | POA: Diagnosis present

## 2023-04-21 DIAGNOSIS — O358XX0 Maternal care for other (suspected) fetal abnormality and damage, not applicable or unspecified: Secondary | ICD-10-CM

## 2023-04-21 DIAGNOSIS — O0993 Supervision of high risk pregnancy, unspecified, third trimester: Secondary | ICD-10-CM | POA: Insufficient documentation

## 2023-04-21 DIAGNOSIS — Z3A32 32 weeks gestation of pregnancy: Secondary | ICD-10-CM | POA: Diagnosis not present

## 2023-04-21 NOTE — Procedures (Signed)
[name] 1990-07-14 [redacted]w[redacted]d   Fetus B Non-Stress Test Interpretation for 04/21/23  Indication:  DIDI twins , GDM  Fetal Heart Rate Fetus B Mode: External Baseline Rate (B): 140 BPM Variability: Moderate Accelerations: 15 x 15 Decelerations: None  Uterine Activity Mode: Toco Contraction Frequency (min): occas Contraction Duration (sec): 50-60 Contraction Quality: Mild (not felt by pt) Resting Tone Palpated: Relaxed  Interpretation (Baby B - Fetal Testing) Nonstress Test Interpretation (Baby B): Reactive Comments (Baby B): Tracing reviewed byDr. [name] [birth year] [redacted]w[redacted]d  Fetus A Non-Stress Test Interpretation for 04/21/23  Indication: IUGR and DIDI twins, GDM  Fetal Heart Rate A Mode: External Baseline Rate (A): 145 bpm Variability: Moderate Accelerations: 15 x 15 Decelerations: None Multiple birth?: Yes  Uterine Activity Mode: Toco Contraction Frequency (min): occas Contraction Duration (sec): 50-60 Contraction Quality: Mild (not felt by pt) Resting Tone Palpated: Relaxed  Interpretation (Fetal Testing) Nonstress Test Interpretation: Reactive Comments: Tracing reviewed by Dr. [name]

## 2023-04-22 ENCOUNTER — Encounter: Payer: Self-pay | Admitting: Internal Medicine

## 2023-04-22 ENCOUNTER — Ambulatory Visit (HOSPITAL_COMMUNITY): Payer: 59 | Attending: Cardiology

## 2023-04-22 DIAGNOSIS — R55 Syncope and collapse: Secondary | ICD-10-CM | POA: Diagnosis not present

## 2023-04-22 DIAGNOSIS — R42 Dizziness and giddiness: Secondary | ICD-10-CM | POA: Insufficient documentation

## 2023-04-22 LAB — ECHOCARDIOGRAM COMPLETE
Area-P 1/2: 4.29 cm2
Calc EF: 54.1 %
S' Lateral: 2.4 cm
Single Plane A2C EF: 54.7 %
Single Plane A4C EF: 53.4 %

## 2023-04-28 ENCOUNTER — Encounter: Payer: Self-pay | Admitting: Obstetrics and Gynecology

## 2023-04-29 ENCOUNTER — Ambulatory Visit: Payer: 59 | Attending: Obstetrics

## 2023-04-29 ENCOUNTER — Ambulatory Visit: Payer: 59 | Admitting: *Deleted

## 2023-04-29 VITALS — BP 120/82

## 2023-04-29 DIAGNOSIS — O24419 Gestational diabetes mellitus in pregnancy, unspecified control: Secondary | ICD-10-CM | POA: Insufficient documentation

## 2023-04-29 DIAGNOSIS — O403XX1 Polyhydramnios, third trimester, fetus 1: Secondary | ICD-10-CM | POA: Diagnosis not present

## 2023-04-29 DIAGNOSIS — O30043 Twin pregnancy, dichorionic/diamniotic, third trimester: Secondary | ICD-10-CM

## 2023-04-29 DIAGNOSIS — O365931 Maternal care for other known or suspected poor fetal growth, third trimester, fetus 1: Secondary | ICD-10-CM

## 2023-04-29 DIAGNOSIS — Z3A33 33 weeks gestation of pregnancy: Secondary | ICD-10-CM

## 2023-04-29 DIAGNOSIS — O0993 Supervision of high risk pregnancy, unspecified, third trimester: Secondary | ICD-10-CM

## 2023-04-29 DIAGNOSIS — O365930 Maternal care for other known or suspected poor fetal growth, third trimester, not applicable or unspecified: Secondary | ICD-10-CM | POA: Insufficient documentation

## 2023-04-29 DIAGNOSIS — O24410 Gestational diabetes mellitus in pregnancy, diet controlled: Secondary | ICD-10-CM

## 2023-04-29 NOTE — Procedures (Signed)
[name] [birth year] [redacted]w[redacted]d  Fetus A Non-Stress Test Interpretation for 04/29/23 (U/S + NST)  Indication: IUGR  Fetal Heart Rate A Mode: External Baseline Rate (A): 145 bpm Variability: Moderate Accelerations: 15 x 15 Decelerations: None Multiple birth?: Yes  Uterine Activity Mode: Palpation, Toco Contraction Frequency (min): UI Contraction Duration (sec): 10-30 Contraction Quality: Mild Resting Tone Palpated: Relaxed Resting Time: Adequate  Interpretation (Fetal Testing) Nonstress Test Interpretation: Reactive Comments: Dr. Grace Bushy reviewed tracing.  [name] 1991/06/25 [redacted]w[redacted]d   Fetus B Non-Stress Test Interpretation for 04/29/23  Indication:  Di/Di twins  Fetal Heart Rate Fetus B Mode: External Baseline Rate (B): 140 BPM Variability: Moderate Accelerations: 15 x 15 Decelerations: None  Uterine Activity Mode: Palpation, Toco Contraction Frequency (min): UI Contraction Duration (sec): 10-30 Contraction Quality: Mild Resting Tone Palpated: Relaxed Resting Time: Adequate  Interpretation (Baby B - Fetal Testing) Nonstress Test Interpretation (Baby B): Reactive Comments (Baby B): Dr. Grace Bushy reviewed tracing.

## 2023-05-04 ENCOUNTER — Ambulatory Visit (INDEPENDENT_AMBULATORY_CARE_PROVIDER_SITE_OTHER): Payer: 59 | Admitting: Obstetrics and Gynecology

## 2023-05-04 VITALS — BP 135/82 | HR 92 | Wt 206.1 lb

## 2023-05-04 DIAGNOSIS — R768 Other specified abnormal immunological findings in serum: Secondary | ICD-10-CM

## 2023-05-04 DIAGNOSIS — O365990 Maternal care for other known or suspected poor fetal growth, unspecified trimester, not applicable or unspecified: Secondary | ICD-10-CM

## 2023-05-04 DIAGNOSIS — O0993 Supervision of high risk pregnancy, unspecified, third trimester: Secondary | ICD-10-CM

## 2023-05-04 DIAGNOSIS — Z3490 Encounter for supervision of normal pregnancy, unspecified, unspecified trimester: Secondary | ICD-10-CM

## 2023-05-04 DIAGNOSIS — O24410 Gestational diabetes mellitus in pregnancy, diet controlled: Secondary | ICD-10-CM

## 2023-05-04 DIAGNOSIS — O30043 Twin pregnancy, dichorionic/diamniotic, third trimester: Secondary | ICD-10-CM

## 2023-05-04 DIAGNOSIS — Z3A34 34 weeks gestation of pregnancy: Secondary | ICD-10-CM

## 2023-05-04 NOTE — Progress Notes (Signed)
   PRENATAL VISIT NOTE  Subjective:  [name] is a 32 y.o. [id number] at [redacted]w[redacted]d being seen today for ongoing prenatal care.  She is currently monitored for the following issues for this high-risk pregnancy and has Uterine mass; Dichorionic diamniotic twin pregnancy in third trimester; Supervision of high-risk pregnancy; Anti -Lewis a antibody positive; Fetal growth restriction antepartum; and GDM (gestational diabetes mellitus) on their problem list.  Patient doing well with no acute concerns today. She reports  pelvic pressure .  Contractions: Irritability. Vag. Bleeding: None.  Movement: Present. Denies leaking of fluid.   The following portions of the patient's history were reviewed and updated as appropriate: allergies, current medications, past family history, past medical history, past social history, past surgical history and problem list. Problem list updated.  Objective:   Vitals:   05/04/23 0943  BP: 135/82  Pulse: 92  Weight: 206 lb 1.6 oz (93.5 kg)    Fetal Status: Fetal Heart Rate (bpm): A:134; B:138 Fundal Height: 45 cm Movement: Present     General:  Alert, oriented and cooperative. Patient is in no acute distress.  Skin: Skin is warm and dry. No rash noted.   Cardiovascular: Normal heart rate noted  Respiratory: Normal respiratory effort, no problems with respiration noted  Abdomen: Soft, gravid, appropriate for gestational age.  Pain/Pressure: Present     Pelvic: Cervical exam deferred        Extremities: Normal range of motion.  Edema: Trace  Mental Status:  Normal mood and affect. Normal behavior. Normal judgment and thought content.   Assessment and Plan:  Pregnancy: [id number] at [redacted]w[redacted]d  1. [redacted] weeks gestation of pregnancy   2. Anti -Lewis a antibody positive   3. Diet controlled gestational diabetes mellitus (GDM) in third trimester FBS: 82-90 PPBS: 90-122  4. Fetal growth restriction antepartum Twin A: 2.1 % Twin B: 31% Continue twice weekly BPP and  deliver at 36 weeks  5. Supervision of high risk pregnancy in third trimester Continue routine prenatal care  6. Dichorionic diamniotic twin pregnancy in third trimester Discordance of 21%, per MFM schedule IOL at 36 weeks due to severe fetal growth restriction Currently vtx/vtx  Preterm labor symptoms and general obstetric precautions including but not limited to vaginal bleeding, contractions, leaking of fluid and fetal movement were reviewed in detail with the patient.  Please refer to After Visit Summary for other counseling recommendations.   Return in about 1 week (around 05/11/2023) for [facility], in person, 36 weeks swabs.   Mariel Aloe, MD Faculty Attending Center for [facility]

## 2023-05-04 NOTE — Progress Notes (Signed)
Pt reports fetal movement with uterine irritability  Reports fasting BG yesterday 90

## 2023-05-06 ENCOUNTER — Encounter (HOSPITAL_COMMUNITY): Payer: Self-pay | Admitting: Obstetrics & Gynecology

## 2023-05-06 ENCOUNTER — Inpatient Hospital Stay (HOSPITAL_COMMUNITY)
Admission: AD | Admit: 2023-05-06 | Discharge: 2023-05-06 | Disposition: A | Discharge status: Home / Self Care | Payer: 59 | Attending: Obstetrics & Gynecology | Admitting: Obstetrics & Gynecology

## 2023-05-06 ENCOUNTER — Telehealth: Payer: Self-pay

## 2023-05-06 ENCOUNTER — Ambulatory Visit: Payer: 59 | Attending: Obstetrics | Admitting: *Deleted

## 2023-05-06 ENCOUNTER — Ambulatory Visit: Payer: 59 | Admitting: Cardiology

## 2023-05-06 ENCOUNTER — Ambulatory Visit: Payer: 59 | Admitting: *Deleted

## 2023-05-06 ENCOUNTER — Other Ambulatory Visit: Payer: Self-pay

## 2023-05-06 ENCOUNTER — Other Ambulatory Visit: Payer: Self-pay | Admitting: *Deleted

## 2023-05-06 ENCOUNTER — Ambulatory Visit: Payer: 59

## 2023-05-06 VITALS — BP 133/88

## 2023-05-06 VITALS — BP 136/92 | HR 90

## 2023-05-06 DIAGNOSIS — B9689 Other specified bacterial agents as the cause of diseases classified elsewhere: Secondary | ICD-10-CM | POA: Diagnosis not present

## 2023-05-06 DIAGNOSIS — O23593 Infection of other part of genital tract in pregnancy, third trimester: Secondary | ICD-10-CM | POA: Diagnosis not present

## 2023-05-06 DIAGNOSIS — O0993 Supervision of high risk pregnancy, unspecified, third trimester: Secondary | ICD-10-CM

## 2023-05-06 DIAGNOSIS — O358XX0 Maternal care for other (suspected) fetal abnormality and damage, not applicable or unspecified: Secondary | ICD-10-CM | POA: Insufficient documentation

## 2023-05-06 DIAGNOSIS — O403XX1 Polyhydramnios, third trimester, fetus 1: Secondary | ICD-10-CM | POA: Insufficient documentation

## 2023-05-06 DIAGNOSIS — Z3A34 34 weeks gestation of pregnancy: Secondary | ICD-10-CM | POA: Insufficient documentation

## 2023-05-06 DIAGNOSIS — O30043 Twin pregnancy, dichorionic/diamniotic, third trimester: Secondary | ICD-10-CM

## 2023-05-06 DIAGNOSIS — O24419 Gestational diabetes mellitus in pregnancy, unspecified control: Secondary | ICD-10-CM

## 2023-05-06 DIAGNOSIS — N760 Acute vaginitis: Secondary | ICD-10-CM

## 2023-05-06 DIAGNOSIS — O26893 Other specified pregnancy related conditions, third trimester: Secondary | ICD-10-CM | POA: Insufficient documentation

## 2023-05-06 DIAGNOSIS — O4700 False labor before 37 completed weeks of gestation, unspecified trimester: Secondary | ICD-10-CM

## 2023-05-06 DIAGNOSIS — O365930 Maternal care for other known or suspected poor fetal growth, third trimester, not applicable or unspecified: Secondary | ICD-10-CM

## 2023-05-06 DIAGNOSIS — O280 Abnormal hematological finding on antenatal screening of mother: Secondary | ICD-10-CM | POA: Diagnosis not present

## 2023-05-06 DIAGNOSIS — O24410 Gestational diabetes mellitus in pregnancy, diet controlled: Secondary | ICD-10-CM | POA: Insufficient documentation

## 2023-05-06 DIAGNOSIS — O1403 Mild to moderate pre-eclampsia, third trimester: Secondary | ICD-10-CM | POA: Diagnosis not present

## 2023-05-06 DIAGNOSIS — O365931 Maternal care for other known or suspected poor fetal growth, third trimester, fetus 1: Secondary | ICD-10-CM | POA: Insufficient documentation

## 2023-05-06 HISTORY — DX: Gestational diabetes mellitus in pregnancy, unspecified control: O24.419

## 2023-05-06 LAB — COMPREHENSIVE METABOLIC PANEL
ALT: 12 U/L (ref 0–44)
AST: 25 U/L (ref 15–41)
Albumin: 2.4 g/dL — ABNORMAL LOW (ref 3.5–5.0)
Alkaline Phosphatase: 144 U/L — ABNORMAL HIGH (ref 38–126)
Anion gap: 7 (ref 5–15)
BUN: <5 mg/dL — ABNORMAL LOW (ref 6–20)
CO2: 20 mmol/L — ABNORMAL LOW (ref 22–32)
Calcium: 9.1 mg/dL (ref 8.9–10.3)
Chloride: 107 mmol/L (ref 98–111)
Creatinine, Ser: 0.67 mg/dL (ref 0.44–1.00)
GFR, Estimated: >60 mL/min (ref >60)
Glucose, Bld: 80 mg/dL (ref 70–99)
Potassium: 3.9 mmol/L (ref 3.5–5.1)
Sodium: 134 mmol/L — ABNORMAL LOW (ref 135–145)
Total Bilirubin: 1.3 mg/dL — ABNORMAL HIGH (ref <1.2)
Total Protein: 6.1 g/dL — ABNORMAL LOW (ref 6.5–8.1)

## 2023-05-06 LAB — CBC
HCT: 26.3 % — ABNORMAL LOW (ref 36.0–46.0)
Hemoglobin: 8.4 g/dL — ABNORMAL LOW (ref 12.0–15.0)
MCH: 28.8 pg (ref 26.0–34.0)
MCHC: 31.9 g/dL (ref 30.0–36.0)
MCV: 90.1 fL (ref 80.0–100.0)
Platelets: 322 10*3/uL (ref 150–400)
RBC: 2.92 MIL/uL — ABNORMAL LOW (ref 3.87–5.11)
RDW: 13.7 % (ref 11.5–15.5)
WBC: 12.6 10*3/uL — ABNORMAL HIGH (ref 4.0–10.5)
nRBC: 0.3 % — ABNORMAL HIGH (ref 0.0–0.2)

## 2023-05-06 LAB — WET PREP, GENITAL
Sperm: NONE SEEN
Trich, Wet Prep: NONE SEEN
WBC, Wet Prep HPF POC: >=10 — AB (ref <10)
Yeast Wet Prep HPF POC: NONE SEEN

## 2023-05-06 LAB — PROTEIN / CREATININE RATIO, URINE
Creatinine, Urine: 80 mg/dL
Protein Creatinine Ratio: 0.5 mg/mg{creat} — ABNORMAL HIGH (ref 0.00–0.15)
Total Protein, Urine: 40 mg/dL

## 2023-05-06 MED ORDER — NIFEDIPINE 10 MG PO CAPS
10.0000 mg | ORAL_CAPSULE | ORAL | Status: AC | PRN
  Administered 2023-05-06 (×4): 10 mg via ORAL
  Filled 2023-05-06 (×4): qty 1

## 2023-05-06 MED ORDER — NIFEDIPINE ER OSMOTIC RELEASE 30 MG PO TB24: 30.0000 mg | ORAL_TABLET | Freq: Every day | ORAL | 1 refills | Status: DC

## 2023-05-06 MED ORDER — NIFEDIPINE 10 MG PO CAPS: 10.0000 mg | ORAL_CAPSULE | ORAL | 0 refills | Status: DC | PRN

## 2023-05-06 MED ORDER — CYCLOBENZAPRINE HCL 5 MG PO TABS
10.0000 mg | ORAL_TABLET | Freq: Once | ORAL | Status: AC
  Administered 2023-05-06: 10 mg via ORAL
  Filled 2023-05-06: qty 2

## 2023-05-06 MED ORDER — METRONIDAZOLE 500 MG PO TABS: 500.0000 mg | ORAL_TABLET | Freq: Two times a day (BID) | ORAL | 0 refills | Status: DC

## 2023-05-06 NOTE — MAU Note (Signed)
[name] is a 32 y.o. at [redacted]w[redacted]d here in MAU reporting: sent from MFM for evaluation of ctxs.  Reports ctxs are regular, hasn't been timing them.  Denies VB or LOF.  Endorses pelvic pressure.  Reports +FM x2. LMP: NA Onset of complaint: today Pain score: 9 Vitals:   05/06/23 1228  BP: 137/83  Pulse: 94  Resp: 18  Temp: 98.2 F (36.8 C)  SpO2: 98%     FHT: A - 145, B - 145 Lab orders placed from triage:   UA

## 2023-05-06 NOTE — Procedures (Signed)
Vernetta MIWA VANEGAS 10-24-1990 [redacted]w[redacted]d  Fetus A Non-Stress Test Interpretation for 05/06/23  Indication:  Di/Di Twins, GDM - diet, IUGR Twin A  Fetal Heart Rate A Mode: External Baseline Rate (A): 145 bpm Variability: Moderate Accelerations: 10 x 10 Decelerations: None Multiple birth?: Yes  Uterine Activity Mode: Toco Contraction Frequency (min): 2-6 Contraction Duration (sec): 40-90 Contraction Quality: Moderate Resting Tone Palpated: Relaxed Resting Time: Adequate  Interpretation (Fetal Testing) Nonstress Test Interpretation: Non-reactive (Spontaneous negative CST) Comments: Tracing reviewed by Dr. Kennieth Francois CATHERYN YOUNIS 06/02/1991 [redacted]w[redacted]d   Fetus B Non-Stress Test Interpretation for 05/06/23  Indication:  Di/Di Twins, GDM - diet, IUGR Twin A  Fetal Heart Rate Fetus B Mode: External Baseline Rate (B): 135 BPM Variability: Moderate Accelerations: 15 x 15 Decelerations: None  Uterine Activity Mode: Toco Contraction Frequency (min): 2-6 Contraction Duration (sec): 40-90 Contraction Quality: Moderate Resting Tone Palpated: Relaxed Resting Time: Adequate  Interpretation (Baby B - Fetal Testing) Nonstress Test Interpretation (Baby B): Reactive Comments (Baby B): Tracing reviewed by Dr. Darra Lis

## 2023-05-06 NOTE — MAU Provider Note (Signed)
MAU Provider Note  Chief Complaint: Contractions   Event Date/Time   First Provider Initiated Contact with Patient 05/06/23 1238      SUBJECTIVE HPI: [name] is a 32 y.o. [id number] at [redacted]w[redacted]d by LMP who presents to maternity admissions reporting regular, painful ctx. Pregnancy c/b di-di twins, FGR twin A, A1GDM. Receives [facility] with [facility].  Patient notes contractions on and off for the past 4 days. More pelvic pressure and pain today, so came in for evaluation. Denies LOF, VB, change in discharge, urinary symptoms. +FM from both twins.   HPI  Past Medical History:  Diagnosis Date   BV (bacterial vaginosis)    Gestational diabetes    Trichomonas    Past Surgical History:  Procedure Laterality Date   DILATION AND CURETTAGE OF UTERUS     Social History   Socioeconomic History   Marital status: Single    Spouse name: Not on file   Number of children: Not on file   Years of education: Not on file   Highest education level: Not on file  Occupational History   Not on file  Tobacco Use   Smoking status: Former    Current packs/day: 0.00    Types: Cigarettes    Quit date: 04/02/2013    Years since quitting: 10.0   Smokeless tobacco: Never  Vaping Use   Vaping status: Never Used  Substance and Sexual Activity   Alcohol use: Not Currently    Comment: occasionally before pregnancy   Drug use: No   Sexual activity: Yes    Partners: Male    Birth control/protection: None    Comment: currently pregnant  Other Topics Concern   Not on file  Social History Narrative   Not on file   Social Determinants of Health   Financial Resource Strain: Not on file  Food Insecurity: Not on file  Transportation Needs: Not on file  Physical Activity: Not on file  Stress: Not on file  Social Connections: Not on file  Intimate Partner Violence: Not on file   No current facility-administered medications on file prior to encounter.   Current Outpatient Medications on File Prior to  Encounter  Medication Sig Dispense Refill   aspirin 81 MG chewable tablet Chew 1 tablet (81 mg total) by mouth daily. Start at 15 weeks 30 tablet 5   Prenatal MV & Min w/FA-DHA (PRENATAL GUMMIES PO) Take 2 tablets by mouth daily.     Accu-Chek Softclix Lancets lancets 100 each by Other route 4 (four) times daily. 100 each 12   Blood Glucose Monitoring Suppl (ACCU-CHEK GUIDE) w/Device KIT 1 kit by Does not apply route daily. 1 kit 0   glucose blood (ACCU-CHEK GUIDE) test strip Use as instructed 100 each 12   Allergies  Allergen Reactions   Latex Rash   Sulfa Antibiotics Hives    ROS:  Pertinent positives/negatives listed above.  I have reviewed patient's Past Medical Hx, Surgical Hx, Family Hx, Social Hx, medications and allergies.   Physical Exam  Patient Vitals for the past 24 hrs:  BP Temp Temp src Pulse Resp SpO2  05/06/23 1500 -- -- -- -- -- 99 %  05/06/23 1430 120/71 -- -- 99 -- 100 %  05/06/23 1415 124/82 -- -- 98 -- --  05/06/23 1400 126/72 -- -- 93 20 96 %  05/06/23 1345 122/64 -- -- 97 -- 97 %  05/06/23 1330 113/67 -- -- (!) 110 -- 98 %  05/06/23 1316 129/83 -- -- 93 -- --  05/06/23 1300 136/82 -- -- (!) 104 -- 98 %  05/06/23 1245 (!) 145/89 -- -- 94 -- 99 %  05/06/23 1237 130/87 -- -- 95 -- --  05/06/23 1228 137/83 98.2 F (36.8 C) Oral 94 18 98 %   Constitutional: Well-developed, well-nourished female. Uncomfortable with ctx Cardiovascular: normal rate Respiratory: normal effort GI: Abd soft, non-tender MS: Extremities nontender, no edema, normal ROM. Homan's negative Neurologic: Alert and oriented x 4  GU: Neg CVAT  FHT Twin A:  Baseline 145 , moderate variability, accelerations present, no decelerations FHT Twin A:  Baseline 140 , moderate variability, accelerations present, no decelerations Contractions: irregular  LAB RESULTS Results for orders placed or performed during the hospital encounter of 05/06/23 (from the past 24 hour(s))  Protein / creatinine  ratio, urine     Status: Abnormal   Collection Time: 05/06/23 12:00 PM  Result Value Ref Range   Creatinine, Urine 80 mg/dL   Total Protein, Urine 40 mg/dL   Protein Creatinine Ratio 0.50 (H) 0.00 - 0.15 mg/mg[Cre]  Wet prep, genital     Status: Abnormal   Collection Time: 05/06/23  1:10 PM   Specimen: Cervix  Result Value Ref Range   Yeast Wet Prep HPF POC NONE SEEN NONE SEEN   Trich, Wet Prep NONE SEEN NONE SEEN   Clue Cells Wet Prep HPF POC PRESENT (A) NONE SEEN   WBC, Wet Prep HPF POC >=10 (A) <10   Sperm NONE SEEN   CBC     Status: Abnormal   Collection Time: 05/06/23  2:22 PM  Result Value Ref Range   WBC 12.6 (H) 4.0 - 10.5 K/uL   RBC 2.92 (L) 3.87 - 5.11 MIL/uL   Hemoglobin 8.4 (L) 12.0 - 15.0 g/dL   HCT 13.2 (L) 44.0 - 10.2 %   MCV 90.1 80.0 - 100.0 fL   MCH 28.8 26.0 - 34.0 pg   MCHC 31.9 30.0 - 36.0 g/dL   RDW 72.5 36.6 - 44.0 %   Platelets 322 150 - 400 K/uL   nRBC 0.3 (H) 0.0 - 0.2 %  Comprehensive metabolic panel     Status: Abnormal   Collection Time: 05/06/23  2:22 PM  Result Value Ref Range   Sodium 134 (L) 135 - 145 mmol/L   Potassium 3.9 3.5 - 5.1 mmol/L   Chloride 107 98 - 111 mmol/L   CO2 20 (L) 22 - 32 mmol/L   Glucose, Bld 80 70 - 99 mg/dL   BUN <5 (L) 6 - 20 mg/dL   Creatinine, Ser 3.47 0.44 - 1.00 mg/dL   Calcium 9.1 8.9 - 42.5 mg/dL   Total Protein 6.1 (L) 6.5 - 8.1 g/dL   Albumin 2.4 (L) 3.5 - 5.0 g/dL   AST 25 15 - 41 U/L   ALT 12 0 - 44 U/L   Alkaline Phosphatase 144 (H) 38 - 126 U/L   Total Bilirubin 1.3 (H) <1.2 mg/dL   GFR, Estimated >95 >63 mL/min   Anion gap 7 5 - 15    A/Positive/-- (05/14 1407)  IMAGING [place] MFM UA CORD DOPPLER  Result Date: 05/06/2023 ----------------------------------------------------------------------  OBSTETRICS REPORT                       (Signed Final 05/06/2023 10:39 am) ---------------------------------------------------------------------- Patient Info  ID #:       [id number]  D.O.B.:  1991-01-19 (32 yrs)  Name:       [name]                  Visit Date: 05/06/2023 07:24 am ---------------------------------------------------------------------- Performed By  Attending:        Braxton Feathers DO       Ref. Address:     [address]                                                             [address]                                                             [place]                                                             [address]  Performed By:     Isac Sarna        [facility]                                  [facility]  Referred By:      [facility] ---------------------------------------------------------------------- Orders  #  Description                           Code        Ordered By  1  [place] MFM UA CORD DOPPLER                [phone]  YU FANG  2  [place] MFM FETAL BPP                      [id number]     YU FANG     W/NONSTRESS  3  [place] MFM UA ADDL GEST                   76820.01    YU FANG  4  [place] MFM FETAL BPP                      [id number]     YU FANG     W/NONSTRESS ADD'L GEST ----------------------------------------------------------------------  #  Order #                     Accession #                Episode #  1  [id number]                   [id number]                 [id number]  2  [id number]                   [id number]                 [id number]  3  [id number]                   [id number]                 [id number]  4  [id number]                   [id number]                 [id number] ---------------------------------------------------------------------- Indications  Maternal care for known or suspected poor      O36.5931  fetal growth, third trimester, fetus 1 IUGR  Polyhydramnios, third trimester, antepartum     O40.3XX1  condition or complication, fetus 1  Twin pregnancy, di/di, third trimester         O30.043  Gestational diabetes in pregnancy, diet        O24.410  controlled  Pyelectasis of fetus on prenatal ultrasound    O28.3  (Fetus A- resolved)  Fetal or maternal indication (Anti-Lewis A     O35.8XX0  Antibodies)  Low risk NIPS (female/female)  [redacted] weeks gestation of pregnancy                Z3A.34 ---------------------------------------------------------------------- Fetal Evaluation (Fetus A)  Num Of Fetuses:         2  Fetal Heart Rate(bpm):  154  Cardiac Activity:       Observed  Fetal Lie:              Maternal right side  Presentation:           Cephalic  Placenta:               Anterior  P. Cord Insertion:      Previously seen  Membrane Desc:      Dividing Membrane seen - Dichorionic.  Amniotic Fluid  AFI FV:      Polyhydramnios                              Largest Pocket(cm)  9.73 ---------------------------------------------------------------------- Biophysical Evaluation (Fetus A)  Amniotic F.V:   Pocket => 2 cm             F. Tone:        Observed  F. Movement:    Observed                   N.S.T:          Equivocal  F. Breathing:   Observed                   Score:          8/10 ---------------------------------------------------------------------- OB History  Blood Type:   A+  Gravidity:    7         Term:   2         SAB:   4  Living:       2 ---------------------------------------------------------------------- Gestational Age (Fetus A)  LMP:           34w 4d        Date:  09/06/22                  EDD:   06/13/23  Best:          34w 4d     Det. By:  LMP  (09/06/22)          EDD:   06/13/23 ---------------------------------------------------------------------- Anatomy (Fetus A)  Diaphragm:             Appears normal         Kidneys:                Appear normal  Stomach:               Appears normal, left   Bladder:                Appears normal                         sided  ---------------------------------------------------------------------- Doppler - Fetal Vessels (Fetus A)  Umbilical Artery   S/D     %tile      RI    %tile      PI    %tile     PSV    ADFV    RDFV                                                     (cm/s)   2.36       41    0.58       48    0.82       44    45.52      No      No ---------------------------------------------------------------------- Fetal Evaluation (Fetus B)  Num Of Fetuses:         2  Fetal Heart Rate(bpm):  137  Cardiac Activity:       Observed  Fetal Lie:              Maternal left side  Presentation:           Cephalic  Placenta:               Posterior  P. Cord Insertion:      Previously seen  Membrane Desc:      Dividing Membrane seen - Dichorionic.  Amniotic Fluid  AFI FV:      Within normal limits                              Largest Pocket(cm)                              5.05 ---------------------------------------------------------------------- Biophysical Evaluation (Fetus B)  Amniotic F.V:   Pocket => 2 cm             F. Tone:        Observed  F. Movement:    Observed                   N.S.T:          Reactive  F. Breathing:   Observed                   Score:          10/10 ---------------------------------------------------------------------- Gestational Age (Fetus B)  LMP:           34w 4d        Date:  09/06/22                  EDD:   06/13/23  Best:          34w 4d     Det. By:  LMP  (09/06/22)          EDD:   06/13/23 ---------------------------------------------------------------------- Anatomy (Fetus B)  Diaphragm:             Appears normal         Kidneys:                Appear normal  Stomach:               Appears normal, left   Bladder:                Appears normal                         sided ---------------------------------------------------------------------- Doppler - Fetal Vessels (Fetus B)  Umbilical Artery   S/D     %tile      RI    %tile      PI    %tile     PSV    ADFV    RDFV                                                      (cm/s)   2.52       52     0.6       57    0.92       64    46.36      No      No ---------------------------------------------------------------------- Comments  The patient is here for ultrasound at 34w 4d.   Her pregnancy  is complicated by FGR of twin A in the setting of DM.  Blood  sugars are in the 90s (fasting) and 100-140s (2hPP). She  reports painful contractions today and will go to MAU for  assessment. Her  BP was initially elevated but repeat was  normal. She has no other concerns.  EDD: 06/13/2023 dated by LMP  (09/06/22).  Sonographic findings  Di-di intrauterine pregnancy at 34w 4d  Presentation: A: Cephalic, B: Cephalic.  Fetal cardiac activity:  A: Observed, B: Observed and  appears normal.  Placenta: A: Anterior, B: Posterior.  MVP: A: 9.73, B: 5.05.  UA dopplers are normal.  Recommendations  - NST twice weekly with weekly UA dopplers  - Sent to MAU for r/o labor and preE  - Delivery scheduled for 05/16/23  I discussed the limitations of prenatal ultrasound with the  patient including inability to detect certain abnormalities and  poor visualization of certain anatomic structures due to fetal  position, gestational age and maternal body habitus.  The  patient verbalized understanding and had time to ask  questions that were answered to her satisfaction. ----------------------------------------------------------------------                  Braxton Feathers, DO Electronically Signed Final Report   05/06/2023 10:39 am ----------------------------------------------------------------------   [place] MFM FETAL BPP W/NONSTRESS  Result Date: 05/06/2023 ----------------------------------------------------------------------  OBSTETRICS REPORT                       (Signed Final 05/06/2023 10:39 am) ---------------------------------------------------------------------- Patient Info  ID #:       [id number]                          D.O.B.:  16-May-1991 (32 yrs)  Name:       [name]                   Visit Date: 05/06/2023 07:24 am ---------------------------------------------------------------------- Performed By  Attending:        Braxton Feathers DO       Ref. Address:     [address]                                                             [address]                                                             [place]                                                             [address]  Performed By:     Isac Sarna        [facility]                                  [facility]  Referred By:      [facility] ---------------------------------------------------------------------- Orders  #  Description                           Code        Ordered By  1  [place] MFM UA CORD DOPPLER                76820.02    YU FANG  2  [place] MFM FETAL BPP                      76818.5     YU FANG     W/NONSTRESS  3  [place] MFM UA ADDL GEST                   76820.01    YU FANG  4  [place] MFM FETAL BPP                      95188.4     YU FANG     W/NONSTRESS ADD'L GEST ----------------------------------------------------------------------  #  Order #                     Accession #                Episode #  1  [id number]                   [id number]                 [id number]  2  [id number]                   [id number]                 [id number]  3  [id number]                   [id number]                 [id number]  4  [id number]                   [id number]                 [id number] ---------------------------------------------------------------------- Indications  Maternal care for known or suspected poor      V40.0867  fetal growth, third trimester, fetus 1 IUGR  Polyhydramnios, third trimester, antepartum    O40.3XX1  condition or complication, fetus 1  Twin pregnancy, di/di, third  trimester         O30.043  Gestational diabetes in pregnancy, diet        O24.410  controlled  Pyelectasis of fetus on prenatal ultrasound    O28.3  (Fetus A- resolved)  Fetal or maternal indication (Anti-Lewis A     O35.8XX0  Antibodies)  Low risk NIPS (female/female)  [redacted] weeks gestation of pregnancy                Z3A.34 ---------------------------------------------------------------------- Fetal Evaluation (Fetus A)  Num Of Fetuses:         2  Fetal Heart Rate(bpm):  154  Cardiac Activity:       Observed  Fetal Lie:              Maternal right side  Presentation:           Cephalic  Placenta:               Anterior  P. Cord Insertion:      Previously seen  Membrane Desc:      Dividing Membrane seen - Dichorionic.  Amniotic Fluid  AFI FV:      Polyhydramnios                              Largest Pocket(cm)                              9.73 ---------------------------------------------------------------------- Biophysical Evaluation (Fetus A)  Amniotic F.V:   Pocket => 2 cm             F. Tone:        Observed  F. Movement:    Observed                   N.S.T:          Equivocal  F. Breathing:   Observed                   Score:          8/10 ---------------------------------------------------------------------- OB History  Blood Type:   A+  Gravidity:    7         Term:   2         SAB:   4  Living:       2 ---------------------------------------------------------------------- Gestational Age (Fetus A)  LMP:           34w 4d        Date:  09/06/22                  EDD:   06/13/23  Best:          34w 4d     Det. By:  LMP  (09/06/22)          EDD:   06/13/23 ---------------------------------------------------------------------- Anatomy (Fetus A)  Diaphragm:             Appears normal         Kidneys:                Appear normal  Stomach:               Appears normal, left   Bladder:  Appears normal                         sided ---------------------------------------------------------------------- Doppler  - Fetal Vessels (Fetus A)  Umbilical Artery   S/D     %tile      RI    %tile      PI    %tile     PSV    ADFV    RDFV                                                     (cm/s)   2.36       41    0.58       48    0.82       44    45.52      No      No ---------------------------------------------------------------------- Fetal Evaluation (Fetus B)  Num Of Fetuses:         2  Fetal Heart Rate(bpm):  137  Cardiac Activity:       Observed  Fetal Lie:              Maternal left side  Presentation:           Cephalic  Placenta:               Posterior  P. Cord Insertion:      Previously seen  Membrane Desc:      Dividing Membrane seen - Dichorionic.  Amniotic Fluid  AFI FV:      Within normal limits                              Largest Pocket(cm)                              5.05 ---------------------------------------------------------------------- Biophysical Evaluation (Fetus B)  Amniotic F.V:   Pocket => 2 cm             F. Tone:        Observed  F. Movement:    Observed                   N.S.T:          Reactive  F. Breathing:   Observed                   Score:          10/10 ---------------------------------------------------------------------- Gestational Age (Fetus B)  LMP:           34w 4d        Date:  09/06/22                  EDD:   06/13/23  Best:          34w 4d     Det. By:  LMP  (09/06/22)          EDD:   06/13/23 ---------------------------------------------------------------------- Anatomy (Fetus B)  Diaphragm:             Appears normal         Kidneys:                Appear  normal  Stomach:               Appears normal, left   Bladder:                Appears normal                         sided ---------------------------------------------------------------------- Doppler - Fetal Vessels (Fetus B)  Umbilical Artery   S/D     %tile      RI    %tile      PI    %tile     PSV    ADFV    RDFV                                                     (cm/s)   2.52       52     0.6       57    0.92       64     46.36      No      No ---------------------------------------------------------------------- Comments  The patient is here for ultrasound at 34w 4d.   Her pregnancy  is complicated by FGR of twin A in the setting of DM.  Blood  sugars are in the 90s (fasting) and 100-140s (2hPP). She  reports painful contractions today and will go to MAU for  assessment. Her BP was initially elevated but repeat was  normal. She has no other concerns.  EDD: 06/13/2023 dated by LMP  (09/06/22).  Sonographic findings  Di-di intrauterine pregnancy at 34w 4d  Presentation: A: Cephalic, B: Cephalic.  Fetal cardiac activity:  A: Observed, B: Observed and  appears normal.  Placenta: A: Anterior, B: Posterior.  MVP: A: 9.73, B: 5.05.  UA dopplers are normal.  Recommendations  - NST twice weekly with weekly UA dopplers  - Sent to MAU for r/o labor and preE  - Delivery scheduled for 05/16/23  I discussed the limitations of prenatal ultrasound with the  patient including inability to detect certain abnormalities and  poor visualization of certain anatomic structures due to fetal  position, gestational age and maternal body habitus.  The  patient verbalized understanding and had time to ask  questions that were answered to her satisfaction. ----------------------------------------------------------------------                  Braxton Feathers, DO Electronically Signed Final Report   05/06/2023 10:39 am ----------------------------------------------------------------------   [place] MFM UA ADDL GEST  Result Date: 05/06/2023 ----------------------------------------------------------------------  OBSTETRICS REPORT                       (Signed Final 05/06/2023 10:39 am) ---------------------------------------------------------------------- Patient Info  ID #:       [id number]                          D.O.B.:  04-Aug-1990 (32 yrs)  Name:       [name]                  Visit Date: 05/06/2023 07:24 am  ---------------------------------------------------------------------- Performed By  Attending:        Braxton Feathers DO       Ref. Address:  [address]                                                             [address]                                                             [place]                                                             [address]  Performed By:     Isac Sarna        [facility]                                  [facility]  Referred By:      [facility] ---------------------------------------------------------------------- Orders  #  Description                           Code        Ordered By  1  [place] MFM UA CORD DOPPLER                76820.02    YU FANG  2  [place] MFM FETAL BPP                      09323.5     YU FANG     W/NONSTRESS  3  [place] MFM UA ADDL GEST  76820.01    YU FANG  4  [place] MFM FETAL BPP                      [address].6     YU FANG     W/NONSTRESS ADD'L GEST ----------------------------------------------------------------------  #  Order #                     Accession #                Episode #  1  [id number]                   [id number]                 [id number]  2  [id number]                   [id number]                 [id number]  3  [id number]                   [id number]                 [id number]  4  [id number]                   [id number]                 [id number] ---------------------------------------------------------------------- Indications  Maternal care for known or suspected poor      O36.5931  fetal growth, third trimester, fetus 1 IUGR  Polyhydramnios, third trimester, antepartum    O40.3XX1  condition or complication, fetus 1  Twin pregnancy, di/di, third trimester         O30.043   Gestational diabetes in pregnancy, diet        O24.410  controlled  Pyelectasis of fetus on prenatal ultrasound    O28.3  (Fetus A- resolved)  Fetal or maternal indication (Anti-Lewis A     O35.8XX0  Antibodies)  Low risk NIPS (female/female)  [redacted] weeks gestation of pregnancy                Z3A.34 ---------------------------------------------------------------------- Fetal Evaluation (Fetus A)  Num Of Fetuses:         2  Fetal Heart Rate(bpm):  154  Cardiac Activity:       Observed  Fetal Lie:              Maternal right side  Presentation:           Cephalic  Placenta:               Anterior  P. Cord Insertion:      Previously seen  Membrane Desc:      Dividing Membrane seen - Dichorionic.  Amniotic Fluid  AFI FV:      Polyhydramnios                              Largest Pocket(cm)                              9.73 ---------------------------------------------------------------------- Biophysical Evaluation (Fetus A)  Amniotic F.V:   Pocket => 2 cm             F. Tone:        Observed  F. Movement:  Observed                   N.S.T:          Equivocal  F. Breathing:   Observed                   Score:          8/10 ---------------------------------------------------------------------- OB History  Blood Type:   A+  Gravidity:    7         Term:   2         SAB:   4  Living:       2 ---------------------------------------------------------------------- Gestational Age (Fetus A)  LMP:           34w 4d        Date:  09/06/22                  EDD:   06/13/23  Best:          34w 4d     Det. By:  LMP  (09/06/22)          EDD:   06/13/23 ---------------------------------------------------------------------- Anatomy (Fetus A)  Diaphragm:             Appears normal         Kidneys:                Appear normal  Stomach:               Appears normal, left   Bladder:                Appears normal                         sided ---------------------------------------------------------------------- Doppler - Fetal Vessels (Fetus A)   Umbilical Artery   S/D     %tile      RI    %tile      PI    %tile     PSV    ADFV    RDFV                                                     (cm/s)   2.36       41    0.58       48    0.82       44    45.52      No      No ---------------------------------------------------------------------- Fetal Evaluation (Fetus B)  Num Of Fetuses:         2  Fetal Heart Rate(bpm):  137  Cardiac Activity:       Observed  Fetal Lie:              Maternal left side  Presentation:           Cephalic  Placenta:               Posterior  P. Cord Insertion:      Previously seen  Membrane Desc:      Dividing Membrane seen - Dichorionic.  Amniotic Fluid  AFI FV:      Within normal limits  Largest Pocket(cm)                              5.05 ---------------------------------------------------------------------- Biophysical Evaluation (Fetus B)  Amniotic F.V:   Pocket => 2 cm             F. Tone:        Observed  F. Movement:    Observed                   N.S.T:          Reactive  F. Breathing:   Observed                   Score:          10/10 ---------------------------------------------------------------------- Gestational Age (Fetus B)  LMP:           34w 4d        Date:  09/06/22                  EDD:   06/13/23  Best:          34w 4d     Det. By:  LMP  (09/06/22)          EDD:   06/13/23 ---------------------------------------------------------------------- Anatomy (Fetus B)  Diaphragm:             Appears normal         Kidneys:                Appear normal  Stomach:               Appears normal, left   Bladder:                Appears normal                         sided ---------------------------------------------------------------------- Doppler - Fetal Vessels (Fetus B)  Umbilical Artery   S/D     %tile      RI    %tile      PI    %tile     PSV    ADFV    RDFV                                                     (cm/s)   2.52       52     0.6       57    0.92       64    46.36      No      No  ---------------------------------------------------------------------- Comments  The patient is here for ultrasound at 34w 4d.   Her pregnancy  is complicated by FGR of twin A in the setting of DM.  Blood  sugars are in the 90s (fasting) and 100-140s (2hPP). She  reports painful contractions today and will go to [facility]U for  assessment. Her BP was initially elevated but repeat was  normal. She has no other concerns.  EDD: 06/13/2023 dated by LMP  (09/06/22).  Sonographic findings  Di-di intrauterine pregnancy at 34w 4d  Presentation: A: Cephalic, B: Cephalic.  Fetal cardiac activity:  A: Observed, B: Observed and  appears normal.  Placenta: A: Anterior,  B: Posterior.  MVP: A: 9.73, B: 5.05.  UA dopplers are normal.  Recommendations  - NST twice weekly with weekly UA dopplers  - Sent to MAU for r/o labor and preE  - Delivery scheduled for 05/16/23  I discussed the limitations of prenatal ultrasound with the  patient including inability to detect certain abnormalities and  poor visualization of certain anatomic structures due to fetal  position, gestational age and maternal body habitus.  The  patient verbalized understanding and had time to ask  questions that were answered to her satisfaction. ----------------------------------------------------------------------                  Braxton Feathers, DO Electronically Signed Final Report   05/06/2023 10:39 am ----------------------------------------------------------------------   [place] MFM FETAL BPP W/NONSTRESS ADD'L GEST  Result Date: 05/06/2023 ----------------------------------------------------------------------  OBSTETRICS REPORT                       (Signed Final 05/06/2023 10:39 am) ---------------------------------------------------------------------- Patient Info  ID #:       [id number]                          D.O.B.:  Jan 16, 1991 (32 yrs)  Name:       [name]                  Visit Date: 05/06/2023 07:24 am  ---------------------------------------------------------------------- Performed By  Attending:        Braxton Feathers DO       Ref. Address:     [address]                                                             [address]                                                             [place]                                                             [address]  Performed By:     Isac Sarna        [facility]                    BS [facility]  Referred By:      [facility] ---------------------------------------------------------------------- Orders  #  Description                           Code        Ordered By  1  [place] MFM UA CORD DOPPLER                76820.02    YU FANG  2  [place] MFM FETAL BPP                      76818.5     YU FANG     W/NONSTRESS  3  [place] MFM UA ADDL GEST                   76820.01    YU FANG  4  [place] MFM FETAL BPP                      86578.4     YU FANG     W/NONSTRESS ADD'L GEST ----------------------------------------------------------------------  #  Order #                     Accession #                Episode #  1  [id number]                   [id number]                 [id number]  2  [id number]                   [id number]                 [id number]  3  [id number]                   [id number]                 [id number]  4  [id number]                   [id number]                 [id number] ---------------------------------------------------------------------- Indications  Maternal care for known or suspected poor      O36.5931  fetal growth, third trimester, fetus 1 IUGR  Polyhydramnios, third trimester, antepartum    O40.3XX1  condition or complication, fetus 1  Twin pregnancy, di/di, third trimester         O30.043   Gestational diabetes in pregnancy, diet        O24.410  controlled  Pyelectasis of  fetus on prenatal ultrasound    O28.3  (Fetus A- resolved)  Fetal or maternal indication (Anti-Lewis A     O35.8XX0  Antibodies)  Low risk NIPS (female/female)  [redacted] weeks gestation of pregnancy                Z3A.34 ---------------------------------------------------------------------- Fetal Evaluation (Fetus A)  Num Of Fetuses:         2  Fetal Heart Rate(bpm):  154  Cardiac Activity:       Observed  Fetal Lie:              Maternal right side  Presentation:           Cephalic  Placenta:               Anterior  P. Cord Insertion:      Previously seen  Membrane Desc:      Dividing Membrane seen - Dichorionic.  Amniotic Fluid  AFI FV:      Polyhydramnios                              Largest Pocket(cm)                              9.73 ---------------------------------------------------------------------- Biophysical Evaluation (Fetus A)  Amniotic F.V:   Pocket => 2 cm             F. Tone:        Observed  F. Movement:    Observed                   N.S.T:          Equivocal  F. Breathing:   Observed                   Score:          8/10 ---------------------------------------------------------------------- OB History  Blood Type:   A+  Gravidity:    7         Term:   2         SAB:   4  Living:       2 ---------------------------------------------------------------------- Gestational Age (Fetus A)  LMP:           34w 4d        Date:  09/06/22                  EDD:   06/13/23  Best:          34w 4d     Det. By:  LMP  (09/06/22)          EDD:   06/13/23 ---------------------------------------------------------------------- Anatomy (Fetus A)  Diaphragm:             Appears normal         Kidneys:                Appear normal  Stomach:               Appears normal, left   Bladder:                Appears normal                         sided ---------------------------------------------------------------------- Doppler - Fetal Vessels (Fetus A)   Umbilical Artery   S/D     %  tile      RI    %tile      PI    %tile     PSV    ADFV    RDFV                                                     (cm/s)   2.36       41    0.58       48    0.82       44    45.52      No      No ---------------------------------------------------------------------- Fetal Evaluation (Fetus B)  Num Of Fetuses:         2  Fetal Heart Rate(bpm):  137  Cardiac Activity:       Observed  Fetal Lie:              Maternal left side  Presentation:           Cephalic  Placenta:               Posterior  P. Cord Insertion:      Previously seen  Membrane Desc:      Dividing Membrane seen - Dichorionic.  Amniotic Fluid  AFI FV:      Within normal limits                              Largest Pocket(cm)                              5.05 ---------------------------------------------------------------------- Biophysical Evaluation (Fetus B)  Amniotic F.V:   Pocket => 2 cm             F. Tone:        Observed  F. Movement:    Observed                   N.S.T:          Reactive  F. Breathing:   Observed                   Score:          10/10 ---------------------------------------------------------------------- Gestational Age (Fetus B)  LMP:           34w 4d        Date:  09/06/22                  EDD:   06/13/23  Best:          34w 4d     Det. By:  LMP  (09/06/22)          EDD:   06/13/23 ---------------------------------------------------------------------- Anatomy (Fetus B)  Diaphragm:             Appears normal         Kidneys:                Appear normal  Stomach:               Appears normal, left   Bladder:                Appears normal  sided ---------------------------------------------------------------------- Doppler - Fetal Vessels (Fetus B)  Umbilical Artery   S/D     %tile      RI    %tile      PI    %tile     PSV    ADFV    RDFV                                                     (cm/s)   2.52       52     0.6       57    0.92       64    46.36      No      No  ---------------------------------------------------------------------- Comments  The patient is here for ultrasound at 34w 4d.   Her pregnancy  is complicated by FGR of twin A in the setting of DM.  Blood  sugars are in the 90s (fasting) and 100-140s (2hPP). She  reports painful contractions today and will go to MAU for  assessment. Her BP was initially elevated but repeat was  normal. She has no other concerns.  EDD: 06/13/2023 dated by LMP  (09/06/22).  Sonographic findings  Di-di intrauterine pregnancy at 34w 4d  Presentation: A: Cephalic, B: Cephalic.  Fetal cardiac activity:  A: Observed, B: Observed and  appears normal.  Placenta: A: Anterior, B: Posterior.  MVP: A: 9.73, B: 5.05.  UA dopplers are normal.  Recommendations  - NST twice weekly with weekly UA dopplers  - Sent to MAU for r/o labor and preE  - Delivery scheduled for 05/16/23  I discussed the limitations of prenatal ultrasound with the  patient including inability to detect certain abnormalities and  poor visualization of certain anatomic structures due to fetal  position, gestational age and maternal body habitus.  The  patient verbalized understanding and had time to ask  questions that were answered to her satisfaction. ----------------------------------------------------------------------                  Braxton Feathers, DO Electronically Signed Final Report   05/06/2023 10:39 am ----------------------------------------------------------------------   [place] MFM UA CORD DOPPLER  Result Date: 04/29/2023 ----------------------------------------------------------------------  OBSTETRICS REPORT                       (Signed Final 04/29/2023 03:31 pm) ---------------------------------------------------------------------- Patient Info  ID #:       [id number]                          D.O.B.:  [birth year] (32 yrs)  Name:       [name]                  Visit Date: 04/29/2023 01:51 pm ----------------------------------------------------------------------  Performed By  Attending:        Lin Landsman      Ref. Address:     [address] [place]                    [place]  [address]                                                             [address]                                                             [place]                                                             [address]  Performed By:     Emeline Darling BS,      Location:         [facility]                    RDMS                                     [facility]  Referred By:      [facility] ---------------------------------------------------------------------- Orders  #  Description                           Code        Ordered By  1  [place] MFM UA CORD DOPPLER                76820.02    YU FANG  2  [place] MFM FETAL BPP                      40981.1     YU FANG     W/NONSTRESS  3  [place] MFM UA ADDL GEST                   76820.01    YU FANG  4  [place] MFM FETAL BPP                      91478.2     YU FANG     W/NONSTRESS ADD'L GEST ----------------------------------------------------------------------  #  Order #  Accession #                Episode #  1  [id number]                   [id number]                 [id number]  2  [id number]                   [id number]                 [id number]  3  [id number]                   [id number]                 [id number]  4  [id number]                   [id number]                 [id number] ---------------------------------------------------------------------- Indications  [redacted] weeks gestation of pregnancy                Z3A.33  Maternal care for known or suspected poor      O36.5931  fetal growth, third trimester, fetus 1 IUGR  Twin pregnancy, di/di, third trimester         O30.043  Polyhydramnios, third trimester, antepartum    O40.3XX1  condition or complication, fetus 1   Gestational diabetes in pregnancy, diet        O24.410  controlled  Pyelectasis of fetus on prenatal ultrasound    O28.3  (Fetus A- resolved)  Fetal or maternal indication (Anti-Lewis A     O35.8XX0  Antibodies)  Low risk NIPS (female/female) ---------------------------------------------------------------------- Fetal Evaluation (Fetus A)  Num Of Fetuses:         2  Fetal Heart Rate(bpm):  144  Cardiac Activity:       Observed  Fetal Lie:              Maternal right side  Presentation:           Cephalic  Placenta:               Anterior  P. Cord Insertion:      Previously seen  Membrane Desc:      Dividing Membrane seen - Dichorionic.  Amniotic Fluid  AFI FV:      Subjectively upper-normal                              Largest Pocket(cm)                              4.94 ---------------------------------------------------------------------- Biophysical Evaluation (Fetus A)  Amniotic F.V:   Pocket => 2 cm             F. Tone:        Observed  F. Movement:    Observed                   N.S.T:          Reactive  F. Breathing:   Observed                   Score:          10/10 ---------------------------------------------------------------------- OB History  Blood Type:  A+  Gravidity:    7         Term:   2         SAB:   4  Living:       2 ---------------------------------------------------------------------- Gestational Age (Fetus A)  LMP:           33w 4d        Date:  09/06/22                  EDD:   06/13/23  Best:          33w 4d     Det. By:  LMP  (09/06/22)          EDD:   06/13/23 ---------------------------------------------------------------------- Anatomy (Fetus A)  Heart:                 Appears normal         Stomach:                Appears normal, left                         (4CH, axis, and                                sided                         situs)  Diaphragm:             Appears normal         Bladder:                Appears normal  ---------------------------------------------------------------------- Doppler - Fetal Vessels (Fetus A)  Umbilical Artery   S/D     %tile      RI    %tile      PI    %tile     PSV    ADFV    RDFV                                                     (cm/s)   2.32       34    0.57       38    0.75       24    43.31      No      No ---------------------------------------------------------------------- Fetal Evaluation (Fetus B)  Num Of Fetuses:         2  Fetal Heart Rate(bpm):  140  Cardiac Activity:       Observed  Fetal Lie:              Maternal left side  Presentation:           Cephalic  Placenta:               Posterior  P. Cord Insertion:      Previously seen  Membrane Desc:      Dividing Membrane seen - Dichorionic.  Amniotic Fluid  AFI FV:      Within normal limits  Largest Pocket(cm)                              2.46 ---------------------------------------------------------------------- Biophysical Evaluation (Fetus B)  Amniotic F.V:   Pocket => 2 cm             F. Tone:        Observed  F. Movement:    Observed                   N.S.T:          Reactive  F. Breathing:   Observed                   Score:          10/10 ---------------------------------------------------------------------- Gestational Age (Fetus B)  LMP:           33w 4d        Date:  09/06/22                  EDD:   06/13/23  Best:          33w 4d     Det. By:  LMP  (09/06/22)          EDD:   06/13/23 ---------------------------------------------------------------------- Anatomy (Fetus B)  Ventricles:            Appears normal         Stomach:                Appears normal, left                                                                        sided  Heart:                 Appears normal         Kidneys:                Appear normal                         (4CH, axis, and                         situs)  Diaphragm:             Appears normal         Bladder:                Appears normal  ---------------------------------------------------------------------- Doppler - Fetal Vessels (Fetus B)  Umbilical Artery   S/D     %tile      RI    %tile                      PSV    ADFV    RDFV                                                     (cm/s)   2.65  55    0.62       61                     43.55      No      No ---------------------------------------------------------------------- Impression  Dichorionic-diamniotic twin pregnancy with selective fetal  growth restriction in twin A.  Patient has gestational diabetes  that is well-controlled on diet.  Twin A: Maternal right, cephalic presentation, anterior  placenta.  Normal amniotic fluid volume and good fetal  activity seen.  Umbilical artery Doppler showed normal  forward diastolic flow.  BPP 10/10  Twin B: Maternal left, cephalic presentation, posterior  placenta.  Amniotic fluid is normal and good fetal activity  seen.  Umbilical artery Doppler showed normal forward  diastolic flow.  BPP 10/10 ---------------------------------------------------------------------- Recommendations  Planned delivery at 36 weeks given severe FGR of twin A  Continue 2x weekly testing with BPP/NST. ----------------------------------------------------------------------              Lin Landsman, MD Electronically Signed Final Report   04/29/2023 03:31 pm ----------------------------------------------------------------------   [place] MFM FETAL BPP W/NONSTRESS  Result Date: 04/29/2023 ----------------------------------------------------------------------  OBSTETRICS REPORT                       (Signed Final 04/29/2023 03:31 pm) ---------------------------------------------------------------------- Patient Info  ID #:       [id number]                          D.O.B.:  18-Apr-1991 (32 yrs)  Name:       [name]                  Visit Date: 04/29/2023 01:51 pm ---------------------------------------------------------------------- Performed By  Attending:         Lin Landsman      Ref. Address:     [address]                    [place]                                                             [address]                                                             [address]                                                             [place]                                                             [address]  Performed By:     Emeline Darling BS,      Location[facility]                    RDMS                                     [facility]  Referred By:      [facility] ---------------------------------------------------------------------- Orders  #  Description                           Code        Ordered By  1  [place] MFM UA CORD DOPPLER                76820.02    YU FANG  2  [place] MFM FETAL BPP                      76818.5     YU FANG     W/NONSTRESS  3  [place] MFM UA ADDL GEST                   76820.01    YU FANG  4  [place] MFM FETAL BPP                      40981.1     YU FANG     W/NONSTRESS ADD'L GEST ----------------------------------------------------------------------  #  Order #                     Accession #                Episode #  1  [id number]                   [id number]                 [id number]  2  [id number]                   [id number]                 [id number]  3  [id number]                   [id number]                 [id number]  4  [id number]                   [id number]  [id number] ---------------------------------------------------------------------- Indications  [redacted] weeks gestation of pregnancy                Z3A.33  Maternal care for known or suspected poor      O36.5931  fetal growth, third trimester, fetus 1 IUGR  Twin pregnancy, di/di, third trimester         O30.043  Polyhydramnios, third trimester, antepartum    O40.3XX1  condition or complication, fetus 1  Gestational diabetes in  pregnancy, diet        O24.410  controlled  Pyelectasis of fetus on prenatal ultrasound    O28.3  (Fetus A- resolved)  Fetal or maternal indication (Anti-Lewis A     O35.8XX0  Antibodies)  Low risk NIPS (female/female) ---------------------------------------------------------------------- Fetal Evaluation (Fetus A)  Num Of Fetuses:         2  Fetal Heart Rate(bpm):  144  Cardiac Activity:       Observed  Fetal Lie:              Maternal right side  Presentation:           Cephalic  Placenta:               Anterior  P. Cord Insertion:      Previously seen  Membrane Desc:      Dividing Membrane seen - Dichorionic.  Amniotic Fluid  AFI FV:      Subjectively upper-normal                              Largest Pocket(cm)                              4.94 ---------------------------------------------------------------------- Biophysical Evaluation (Fetus A)  Amniotic F.V:   Pocket => 2 cm             F. Tone:        Observed  F. Movement:    Observed                   N.S.T:          Reactive  F. Breathing:   Observed                   Score:          10/10 ---------------------------------------------------------------------- OB History  Blood Type:   A+  Gravidity:    7         Term:   2         SAB:   4  Living:       2 ---------------------------------------------------------------------- Gestational Age (Fetus A)  LMP:           33w 4d        Date:  09/06/22                  EDD:   06/13/23  Best:          33w 4d     Det. By:  LMP  (09/06/22)          EDD:   06/13/23 ---------------------------------------------------------------------- Anatomy (Fetus A)  Heart:                 Appears normal         Stomach:                Appears normal,  left                         (4CH, axis, and                                sided                         situs)  Diaphragm:             Appears normal         Bladder:                Appears normal ---------------------------------------------------------------------- Doppler - Fetal  Vessels (Fetus A)  Umbilical Artery   S/D     %tile      RI    %tile      PI    %tile     PSV    ADFV    RDFV                                                     (cm/s)   2.32       34    0.57       38    0.75       24    43.31      No      No ---------------------------------------------------------------------- Fetal Evaluation (Fetus B)  Num Of Fetuses:         2  Fetal Heart Rate(bpm):  140  Cardiac Activity:       Observed  Fetal Lie:              Maternal left side  Presentation:           Cephalic  Placenta:               Posterior  P. Cord Insertion:      Previously seen  Membrane Desc:      Dividing Membrane seen - Dichorionic.  Amniotic Fluid  AFI FV:      Within normal limits                              Largest Pocket(cm)                              2.46 ---------------------------------------------------------------------- Biophysical Evaluation (Fetus B)  Amniotic F.V:   Pocket => 2 cm             F. Tone:        Observed  F. Movement:    Observed                   N.S.T:          Reactive  F. Breathing:   Observed                   Score:          10/10 ---------------------------------------------------------------------- Gestational Age (Fetus B)  LMP:           33w 4d        Date:  09/06/22  EDD:   06/13/23  Best:          33w 4d     Det. By:  LMP  (09/06/22)          EDD:   06/13/23 ---------------------------------------------------------------------- Anatomy (Fetus B)  Ventricles:            Appears normal         Stomach:                Appears normal, left                                                                        sided  Heart:                 Appears normal         Kidneys:                Appear normal                         (4CH, axis, and                         situs)  Diaphragm:             Appears normal         Bladder:                Appears normal ---------------------------------------------------------------------- Doppler - Fetal Vessels (Fetus B)   Umbilical Artery   S/D     %tile      RI    %tile                      PSV    ADFV    RDFV                                                     (cm/s)   2.65       55    0.62       61                     43.55      No      No ---------------------------------------------------------------------- Impression  Dichorionic-diamniotic twin pregnancy with selective fetal  growth restriction in twin A.  Patient has gestational diabetes  that is well-controlled on diet.  Twin A: Maternal right, cephalic presentation, anterior  placenta.  Normal amniotic fluid volume and good fetal  activity seen.  Umbilical artery Doppler showed normal  forward diastolic flow.  BPP 10/10  Twin B: Maternal left, cephalic presentation, posterior  placenta.  Amniotic fluid is normal and good fetal activity  seen.  Umbilical artery Doppler showed normal forward  diastolic flow.  BPP 10/10 ---------------------------------------------------------------------- Recommendations  Planned delivery at 36 weeks given severe FGR of twin A  Continue 2x weekly testing with BPP/NST. ----------------------------------------------------------------------              Lin Landsman, MD Electronically Signed Final Report  04/29/2023 03:31 pm ----------------------------------------------------------------------   [place] MFM UA ADDL GEST  Result Date: 04/29/2023 ----------------------------------------------------------------------  OBSTETRICS REPORT                       (Signed Final 04/29/2023 03:31 pm) ---------------------------------------------------------------------- Patient Info  ID #:       [id number]                          D.O.B.:  August 30, 1990 (32 yrs)  Name:       [name]                  Visit Date: 04/29/2023 01:51 pm ---------------------------------------------------------------------- Performed By  Attending:        Lin Landsman      Ref. Address:     [address]                    [place]                                                              [address]                                                             [address]                                                             [place]                                                             [address]  Performed By:     Emeline Darling BS,      [facility]                                     [facility]                                                             [facility]  Referred By:      [facility] ---------------------------------------------------------------------- Orders  #  Description                           Code        Ordered By  1  [place] MFM UA CORD DOPPLER                76820.02    YU FANG  2  [place] MFM FETAL BPP                      [id number]     YU FANG     W/NONSTRESS  3  [place] MFM UA ADDL GEST                   76820.01    YU FANG  4  [place] MFM FETAL BPP                      [id number]     YU FANG     W/NONSTRESS ADD'L GEST ----------------------------------------------------------------------  #  Order #                     Accession #                Episode #  1  [id number]                   [id number]                 [id number]  2  [id number]                   [id number]                 [id number]  3  [id number]                   [id number]                 [id number]  4  [id number]                   [id number]                 [id number] ---------------------------------------------------------------------- Indications  [redacted] weeks gestation of pregnancy                Z3A.33  Maternal care for known or suspected poor      O36.5931  fetal growth, third trimester, fetus 1 IUGR  Twin pregnancy, di/di, third trimester         O30.043  Polyhydramnios, third trimester, antepartum    O40.3XX1  condition or complication, fetus 1  Gestational diabetes in pregnancy, diet        O24.410  controlled  Pyelectasis of fetus on prenatal ultrasound    O28.3  (Fetus A- resolved)  Fetal or  maternal indication (Anti-Lewis A     O35.8XX0  Antibodies)  Low risk NIPS (female/female) ---------------------------------------------------------------------- Fetal Evaluation (Fetus A)  Num Of Fetuses:         2  Fetal Heart Rate(bpm):  144  Cardiac Activity:       Observed  Fetal Lie:              Maternal right side  Presentation:           Cephalic  Placenta:  Anterior  P. Cord Insertion:      Previously seen  Membrane Desc:      Dividing Membrane seen - Dichorionic.  Amniotic Fluid  AFI FV:      Subjectively upper-normal                              Largest Pocket(cm)                              4.94 ---------------------------------------------------------------------- Biophysical Evaluation (Fetus A)  Amniotic F.V:   Pocket => 2 cm             F. Tone:        Observed  F. Movement:    Observed                   N.S.T:          Reactive  F. Breathing:   Observed                   Score:          10/10 ---------------------------------------------------------------------- OB History  Blood Type:   A+  Gravidity:    7         Term:   2         SAB:   4  Living:       2 ---------------------------------------------------------------------- Gestational Age (Fetus A)  LMP:           33w 4d        Date:  09/06/22                  EDD:   06/13/23  Best:          33w 4d     Det. By:  LMP  (09/06/22)          EDD:   06/13/23 ---------------------------------------------------------------------- Anatomy (Fetus A)  Heart:                 Appears normal         Stomach:                Appears normal, left                         (4CH, axis, and                                sided                         situs)  Diaphragm:             Appears normal         Bladder:                Appears normal ---------------------------------------------------------------------- Doppler - Fetal Vessels (Fetus A)  Umbilical Artery   S/D     %tile      RI    %tile      PI    %tile     PSV    ADFV    RDFV                                                      (  cm/s)   2.32       34    0.57       38    0.75       24    43.31      No      No ---------------------------------------------------------------------- Fetal Evaluation (Fetus B)  Num Of Fetuses:         2  Fetal Heart Rate(bpm):  140  Cardiac Activity:       Observed  Fetal Lie:              Maternal left side  Presentation:           Cephalic  Placenta:               Posterior  P. Cord Insertion:      Previously seen  Membrane Desc:      Dividing Membrane seen - Dichorionic.  Amniotic Fluid  AFI FV:      Within normal limits                              Largest Pocket(cm)                              2.46 ---------------------------------------------------------------------- Biophysical Evaluation (Fetus B)  Amniotic F.V:   Pocket => 2 cm             F. Tone:        Observed  F. Movement:    Observed                   N.S.T:          Reactive  F. Breathing:   Observed                   Score:          10/10 ---------------------------------------------------------------------- Gestational Age (Fetus B)  LMP:           33w 4d        Date:  09/06/22                  EDD:   06/13/23  Best:          33w 4d     Det. By:  LMP  (09/06/22)          EDD:   06/13/23 ---------------------------------------------------------------------- Anatomy (Fetus B)  Ventricles:            Appears normal         Stomach:                Appears normal, left                                                                        sided  Heart:                 Appears normal         Kidneys:                Appear normal                         (  4CH, axis, and                         situs)  Diaphragm:             Appears normal         Bladder:                Appears normal ---------------------------------------------------------------------- Doppler - Fetal Vessels (Fetus B)  Umbilical Artery   S/D     %tile      RI    %tile                      PSV    ADFV    RDFV                                                      (cm/s)   2.65       55    0.62       61                     43.55      No      No ---------------------------------------------------------------------- Impression  Dichorionic-diamniotic twin pregnancy with selective fetal  growth restriction in twin A.  Patient has gestational diabetes  that is well-controlled on diet.  Twin A: Maternal right, cephalic presentation, anterior  placenta.  Normal amniotic fluid volume and good fetal  activity seen.  Umbilical artery Doppler showed normal  forward diastolic flow.  BPP 10/10  Twin B: Maternal left, cephalic presentation, posterior  placenta.  Amniotic fluid is normal and good fetal activity  seen.  Umbilical artery Doppler showed normal forward  diastolic flow.  BPP 10/10 ---------------------------------------------------------------------- Recommendations  Planned delivery at 36 weeks given severe FGR of twin A  Continue 2x weekly testing with BPP/NST. ----------------------------------------------------------------------              Lin Landsman, MD Electronically Signed Final Report   04/29/2023 03:31 pm ----------------------------------------------------------------------   [place] MFM FETAL BPP W/NONSTRESS ADD'L GEST  Result Date: 04/29/2023 ----------------------------------------------------------------------  OBSTETRICS REPORT                       (Signed Final 04/29/2023 03:31 pm) ---------------------------------------------------------------------- Patient Info  ID #:       [id number]                          D.O.B.:  1991-01-09 (32 yrs)  Name:       [name]                  Visit Date: 04/29/2023 01:51 pm ---------------------------------------------------------------------- Performed By  Attending:        Lin Landsman      Ref. Address:     [address] [address]                    [place]  [address]                                                             [address]                                                              [place]                                                             [address]  Performed By:     Emeline Darling BS,      Location[facility]  Referred By:      [facility] ---------------------------------------------------------------------- Orders  #  Description                           Code        Ordered By  1  [place] MFM UA CORD DOPPLER                76820.02    YU FANG  2  [place] MFM FETAL BPP                      19147.8     YU FANG     W/NONSTRESS  3  [place] MFM UA ADDL GEST                   76820.01    YU FANG  4  [place] MFM FETAL BPP                      29562.1     YU FANG     W/NONSTRESS ADD'L GEST ----------------------------------------------------------------------  #  Order #  Accession #                Episode #  1  [id number]                   [id number]                 [id number]  2  [id number]                   [id number]                 [id number]  3  [id number]                   [id number]                 [id number]  4  [id number]                   [id number]                 [id number] ---------------------------------------------------------------------- Indications  [redacted] weeks gestation of pregnancy                Z3A.33  Maternal care for known or suspected poor      O36.5931  fetal growth, third trimester, fetus 1 IUGR  Twin pregnancy, di/di, third trimester         O30.043  Polyhydramnios, third trimester, antepartum    O40.3XX1  condition or complication, fetus 1  Gestational diabetes in pregnancy, diet        O24.410  controlled  Pyelectasis of fetus on prenatal ultrasound    O28.3  (Fetus A- resolved)  Fetal or maternal indication (Anti-Lewis A     O35.8XX0  Antibodies)  Low risk NIPS (female/female)  ---------------------------------------------------------------------- Fetal Evaluation (Fetus A)  Num Of Fetuses:         2  Fetal Heart Rate(bpm):  144  Cardiac Activity:       Observed  Fetal Lie:              Maternal right side  Presentation:           Cephalic  Placenta:               Anterior  P. Cord Insertion:      Previously seen  Membrane Desc:      Dividing Membrane seen - Dichorionic.  Amniotic Fluid  AFI FV:      Subjectively upper-normal                              Largest Pocket(cm)                              4.94 ---------------------------------------------------------------------- Biophysical Evaluation (Fetus A)  Amniotic F.V:   Pocket => 2 cm             F. Tone:        Observed  F. Movement:    Observed                   N.S.T:          Reactive  F. Breathing:   Observed                   Score:          10/10 ---------------------------------------------------------------------- OB History  Blood Type:  A+  Gravidity:    7         Term:   2         SAB:   4  Living:       2 ---------------------------------------------------------------------- Gestational Age (Fetus A)  LMP:           33w 4d        Date:  09/06/22                  EDD:   06/13/23  Best:          33w 4d     Det. By:  LMP  (09/06/22)          EDD:   06/13/23 ---------------------------------------------------------------------- Anatomy (Fetus A)  Heart:                 Appears normal         Stomach:                Appears normal, left                         (4CH, axis, and                                sided                         situs)  Diaphragm:             Appears normal         Bladder:                Appears normal ---------------------------------------------------------------------- Doppler - Fetal Vessels (Fetus A)  Umbilical Artery   S/D     %tile      RI    %tile      PI    %tile     PSV    ADFV    RDFV                                                     (cm/s)   2.32       34    0.57       38    0.75       24     43.31      No      No ---------------------------------------------------------------------- Fetal Evaluation (Fetus B)  Num Of Fetuses:         2  Fetal Heart Rate(bpm):  140  Cardiac Activity:       Observed  Fetal Lie:              Maternal left side  Presentation:           Cephalic  Placenta:               Posterior  P. Cord Insertion:      Previously seen  Membrane Desc:      Dividing Membrane seen - Dichorionic.  Amniotic Fluid  AFI FV:      Within normal limits  Largest Pocket(cm)                              2.46 ---------------------------------------------------------------------- Biophysical Evaluation (Fetus B)  Amniotic F.V:   Pocket => 2 cm             F. Tone:        Observed  F. Movement:    Observed                   N.S.T:          Reactive  F. Breathing:   Observed                   Score:          10/10 ---------------------------------------------------------------------- Gestational Age (Fetus B)  LMP:           33w 4d        Date:  09/06/22                  EDD:   06/13/23  Best:          33w 4d     Det. By:  LMP  (09/06/22)          EDD:   06/13/23 ---------------------------------------------------------------------- Anatomy (Fetus B)  Ventricles:            Appears normal         Stomach:                Appears normal, left                                                                        sided  Heart:                 Appears normal         Kidneys:                Appear normal                         (4CH, axis, and                         situs)  Diaphragm:             Appears normal         Bladder:                Appears normal ---------------------------------------------------------------------- Doppler - Fetal Vessels (Fetus B)  Umbilical Artery   S/D     %tile      RI    %tile                      PSV    ADFV    RDFV                                                     (cm/s)   2.65  55    0.62       61                     43.55      No       No ---------------------------------------------------------------------- Impression  Dichorionic-diamniotic twin pregnancy with selective fetal  growth restriction in twin A.  Patient has gestational diabetes  that is well-controlled on diet.  Twin A: Maternal right, cephalic presentation, anterior  placenta.  Normal amniotic fluid volume and good fetal  activity seen.  Umbilical artery Doppler showed normal  forward diastolic flow.  BPP 10/10  Twin B: Maternal left, cephalic presentation, posterior  placenta.  Amniotic fluid is normal and good fetal activity  seen.  Umbilical artery Doppler showed normal forward  diastolic flow.  BPP 10/10 ---------------------------------------------------------------------- Recommendations  Planned delivery at 36 weeks given severe FGR of twin A  Continue 2x weekly testing with BPP/NST. ----------------------------------------------------------------------              Lin Landsman, MD Electronically Signed Final Report   04/29/2023 03:31 pm ----------------------------------------------------------------------   LONG TERM MONITOR-LIVE TELEMETRY (3-14 DAYS)  Result Date: 04/28/2023 Patch Wear Time:  6 days and 22 hours (2024-10-03T10:55:43-0400 to 2024-10-10T09:39:41-0400) Patient had a min HR of 71 bpm, max HR of 184 bpm, and avg HR of 110 bpm. Predominant underlying rhythm was Sinus Rhythm. Isolated SVEs were rare (<1.0%), and no SVE Couplets or SVE Triplets were present. Isolated VEs were rare (<1.0%), and no VE Couplets or VE Triplets were present. No symptoms reported. Conclusion: Normal unremarkable study.   ECHOCARDIOGRAM COMPLETE  Result Date: 04/22/2023    ECHOCARDIOGRAM REPORT   Patient Name:   [name] Date of Exam: 04/22/2023 Medical Rec #:  [id number]      Height:       62.0 in Accession #:    [id number]     Weight:       203.0 lb Date of Birth:  22-Sep-1990      BSA:          1.924 m Patient Age:    32 years       BP:           122/78 mmHg Patient  Gender: F              HR:           87 bpm. Exam Location:  [address] Procedure: 2D Echo, 3D Echo, Cardiac Doppler, Color Doppler and Strain Analysis Indications:    R55 Syncope  History:        Patient has no prior history of Echocardiogram examinations.                 Signs/Symptoms:Syncope and Dizziness/Lightheadedness.  Sonographer:    Eulah Pont RDCS Referring Phys: [id number] KARDIE TOBB  Sonographer Comments: Global longitudinal strain was attempted. IMPRESSIONS  1. Left ventricular ejection fraction, by estimation, is 55 to 60%. The left ventricle has normal function. The left ventricle has no regional wall motion abnormalities. Left ventricular diastolic parameters were normal.  2. Right ventricular systolic function is normal. The right ventricular size is normal. There is normal pulmonary artery systolic pressure.  3. The mitral valve is normal in structure. Trivial mitral valve regurgitation. No evidence of mitral stenosis.  4. The aortic valve is normal in structure. Aortic valve regurgitation is not visualized. No aortic stenosis is present.  5. The inferior vena cava is normal in size with greater than  50% respiratory variability, suggesting right atrial pressure of 3 mmHg. FINDINGS  Left Ventricle: Left ventricular ejection fraction, by estimation, is 55 to 60%. The left ventricle has normal function. The left ventricle has no regional wall motion abnormalities. The left ventricular internal cavity size was normal in size. There is  no left ventricular hypertrophy. Left ventricular diastolic parameters were normal. Right Ventricle: The right ventricular size is normal. No increase in right ventricular wall thickness. Right ventricular systolic function is normal. There is normal pulmonary artery systolic pressure. The tricuspid regurgitant velocity is 2.37 m/s, and  with an assumed right atrial pressure of 3 mmHg, the estimated right ventricular systolic pressure is 25.5 mmHg. Left Atrium:  Left atrial size was normal in size. Right Atrium: Right atrial size was normal in size. Pericardium: There is no evidence of pericardial effusion. Mitral Valve: The mitral valve is normal in structure. Trivial mitral valve regurgitation. No evidence of mitral valve stenosis. Tricuspid Valve: The tricuspid valve is normal in structure. Tricuspid valve regurgitation is mild . No evidence of tricuspid stenosis. Aortic Valve: The aortic valve is normal in structure. Aortic valve regurgitation is not visualized. No aortic stenosis is present. Pulmonic Valve: The pulmonic valve was normal in structure. Pulmonic valve regurgitation is trivial. No evidence of pulmonic stenosis. Aorta: The aortic root is normal in size and structure. Venous: The inferior vena cava is normal in size with greater than 50% respiratory variability, suggesting right atrial pressure of 3 mmHg. IAS/Shunts: No atrial level shunt detected by color flow Doppler.  LEFT VENTRICLE PLAX 2D LVIDd:         4.10 cm      Diastology LVIDs:         2.40 cm      LV e' medial:    9.79 cm/s LV PW:         0.90 cm      LV E/e' medial:  8.7 LV IVS:        0.90 cm      LV e' lateral:   12.40 cm/s LVOT diam:     1.90 cm      LV E/e' lateral: 6.9 LV SV:         48 LV SV Index:   25           2D Longitudinal Strain LVOT Area:     2.84 cm     2D Strain GLS Avg:     -20.8 %  LV Volumes (MOD) LV vol d, MOD A2C: 89.0 ml  3D Volume EF: LV vol d, MOD A4C: 102.0 ml 3D EF:        53 % LV vol s, MOD A2C: 40.3 ml  LV EDV:       108 ml LV vol s, MOD A4C: 47.5 ml  LV ESV:       50 ml LV SV MOD A2C:     48.7 ml  LV SV:        57 ml LV SV MOD A4C:     102.0 ml LV SV MOD BP:      51.9 ml RIGHT VENTRICLE RV S prime:     13.60 cm/s TAPSE (M-mode): 2.3 cm LEFT ATRIUM             Index        RIGHT ATRIUM           Index LA diam:        3.50 cm 1.82 cm/m  RA Area:     13.60 cm LA Vol (A2C):   41.1 ml 21.36 ml/m  RA Volume:   32.10 ml  16.69 ml/m LA Vol (A4C):   44.8 ml 23.29 ml/m  LA Biplane Vol: 45.4 ml 23.60 ml/m  AORTIC VALVE LVOT Vmax:   102.00 cm/s LVOT Vmean:  66.600 cm/s LVOT VTI:    0.170 m  AORTA Ao Root diam: 2.80 cm Ao Asc diam:  2.30 cm MITRAL VALVE               TRICUSPID VALVE MV Area (PHT): 4.29 cm    TR Peak grad:   22.5 mmHg MV Decel Time: 177 msec    TR Vmax:        237.00 cm/s MV E velocity: 85.40 cm/s MV A velocity: 51.40 cm/s  SHUNTS MV E/A ratio:  1.66        Systemic VTI:  0.17 m                            Systemic Diam: 1.90 cm Arvilla Meres MD Electronically signed by Arvilla Meres MD Signature Date/Time: 04/22/2023/10:15:10 AM    Final    [place] MFM OB FOLLOW UP  Result Date: 04/21/2023 ----------------------------------------------------------------------  OBSTETRICS REPORT                       (Signed Final 04/21/2023 11:30 am) ---------------------------------------------------------------------- Patient Info  ID #:       [id number]                          D.O.B.:  02/18/1991 (32 yrs)  Name:       [name]                  Visit Date: 04/21/2023 08:39 am ---------------------------------------------------------------------- Performed By  Attending:        Ma Rings MD         Ref. Address:     [address]                                                             [address]                                                             [place]  [address]  Performed By:     Marcellina Millin       [facility]  Referred By:      [facility] ---------------------------------------------------------------------- Orders  #  Description                           Code        Ordered By  1  [place] MFM  OB FOLLOW UP                   76816.01    YU FANG  2  [place] MFM UA CORD DOPPLER                76820.02    YU FANG  3  [place] MFM FETAL BPP                      60454.0     YU FANG     W/NONSTRESS  4  [place] MFM OB FOLLOW UP ADDL              [id number]    YU FANG     GEST  5  [place] MFM UA ADDL GEST                   76820.01    YU FANG  6  [place] MFM FETAL BPP                      98119.1     YU FANG     W/NONSTRESS ADD'L GEST ----------------------------------------------------------------------  #  Order #                     Accession #                Episode #  1  [id number]                   [id number]                 [id number]  2  [id number]                   [id number]                 [id number]  3  [id number]  [id number]                 [id number]  4  [id number]                   [id number]                 [id number]  5  [id number]                   [id number]                 [id number]  6  [id number]                   [id number]                 [id number] ---------------------------------------------------------------------- Indications  Maternal care for known or suspected poor      O36.5931  fetal growth, third trimester, fetus 1 IUGR  Twin pregnancy, di/di, third trimester         O30.043  Polyhydramnios, third trimester, antepartum    O40.3XX1  condition or complication, fetus 1  Gestational diabetes in pregnancy, diet        O24.410  controlled  Pyelectasis of fetus on prenatal ultrasound    O28.3  (Fetus A- resolved)  Fetal or maternal indication (Anti-Lewis A     O35.8XX0  Antibodies)  [redacted] weeks gestation of pregnancy                Z3A.32  Low risk NIPS (female/female) ---------------------------------------------------------------------- Vital Signs  BP:          122/78 ---------------------------------------------------------------------- Fetal Evaluation (Fetus A)  Num Of Fetuses:         2  Fetal Heart Rate(bpm):  145  Cardiac Activity:       Observed  Fetal Lie:              Maternal right side  Presentation:            Cephalic  Placenta:               Anterior  P. Cord Insertion:      Previously seen  Membrane Desc:      Dividing Membrane seen - Dichorionic.  Amniotic Fluid  AFI FV:      Polyhydramnios                              Largest Pocket(cm)                              9.7 ---------------------------------------------------------------------- Biophysical Evaluation (Fetus A)  Amniotic F.V:   Pocket => 2 cm             F. Tone:        Observed  F. Movement:    Observed                   N.S.T:          Reactive  F. Breathing:   Observed                   Score:          10/10 ---------------------------------------------------------------------- Biometry (Fetus A)  BPD:      76.1  mm     G. Age:  30w 4d          4  %  CI:        78.69   %    70 - 86                                                          FL/HC:      20.2   %    19.1 - 21.3  HC:      271.3  mm     G. Age:  29w 4d        < 1  %    HC/AC:      1.01        0.96 - 1.17  AC:      267.6  mm     G. Age:  30w 6d         11  %    FL/BPD:     72.1   %    71 - 87  FL:       54.9  mm     G. Age:  29w 0d        < 1  %    FL/AC:      20.5   %    20 - 24  Est. FW:    1514  gm      3 lb 5 oz    2.1  %     FW Discordancy        21  % ---------------------------------------------------------------------- OB History  Blood Type:   A+  Gravidity:    7         Term:   2         SAB:   4  Living:       2 ---------------------------------------------------------------------- Gestational Age (Fetus A)  LMP:           32w 3d        Date:  09/06/22                  EDD:   06/13/23  U/S Today:     30w 0d                                        EDD:   06/30/23  Best:          32w 3d     Det. By:  LMP  (09/06/22)          EDD:   06/13/23 ---------------------------------------------------------------------- Targeted Anatomy (Fetus A)  Central Nervous System  Calvarium/Cranial V.:  Appears normal         Cereb./Vermis:          Previously seen  Cavum:                 Appears  normal         Cisterna Magna:         Previously seen  Lateral Ventricles:    Previously seen        Midline Falx:           Previously seen  Choroid Plexus:        Previously seen  Spine  Cervical:              Previously  seen        Sacral:                 Previously seen  Thoracic:              Previously seen        Shape/Curvature:        Previously seen  Lumbar:                Previously seen  Head/Neck  Lips:                  Previously seen        Profile:                Previously seen  Neck:                  Previously seen        Orbits/Eyes:            Previously seen  Nuchal Fold:           Previously seen        Mandible:               Previously seen  Nasal Bone:            Present                Maxilla:                Previously seen  Palate:                Previously seen  Thorax  4 Chamber View:        Previously seen        Interventr. Septum:     Previously seen  Cardiac Rhythm:        Normal                 Cardiac Axis:           Previously een  Cardiac Situs:         Previously seen        Diaphragm:              Appears normal  Rt Outflow Tract:      Previously seen        3 Vessel View:          Previously seen  Lt Outflow Tract:      Previously seen        3 V Trachea View:       Previously seen  Aortic Arch:           Previously seen        IVC:                    Previously seen  Ductal Arch:           Previously seen        Crossing:               Previously seen  SVC:                   Previously seen  Abdomen  Ventral Wall:          Previously seen        Lt Kidney:              Appears normal  Cord Insertion:  Previously seen        Rt Kidney:              Appears normal  Situs:                 Previously seen        Bladder:                Appears normal  Stomach:               Appears normal  Extremities  Lt Humerus:            Previously seen        Lt Femur:               Previously seen  Rt Humerus:            Previously seen        Rt Femur:               Previously seen   Lt Forearm:            Previously seen        Lt Lower Leg:           Previously seen  Rt Forearm:            Previously seen        Rt Lower Leg:           Previously seen  Lt Hand:               Previously seen        Lt Foot:                Previously seen  Rt Hand:               Previously seen        Rt Foot:                Previously seen  Other  Umbilical Cord:        Previously seen        Genitalia:              Female-nml prev ---------------------------------------------------------------------- Doppler - Fetal Vessels (Fetus A)  Umbilical Artery   S/D     %tile      RI    %tile      PI    %tile     PSV    ADFV    RDFV                                                     (cm/s)   3.01       71    0.67       76       1       71    51.58      No      No ---------------------------------------------------------------------- Fetal Evaluation (Fetus B)  Num Of Fetuses:         2  Fetal Heart Rate(bpm):  138  Cardiac Activity:       Observed  Fetal Lie:              Maternal left side  Placenta:  Posterior  P. Cord Insertion:      Previously seen  Membrane Desc:      Dividing Membrane seen - Dichorionic.  Amniotic Fluid  AFI FV:      Within normal limits                              Largest Pocket(cm)                              4.32 ---------------------------------------------------------------------- Biophysical Evaluation (Fetus B)  Amniotic F.V:   Pocket => 2 cm             F. Tone:        Observed  F. Movement:    Observed                   N.S.T:          Reactive  F. Breathing:   Observed                   Score:          10/10 ---------------------------------------------------------------------- Biometry (Fetus B)  BPD:      82.8  mm     G. Age:  33w 2d         69  %    CI:        75.84   %    70 - 86                                                          FL/HC:      19.8   %    19.1 - 21.3  HC:      301.4  mm     G. Age:  33w 3d         39  %    HC/AC:      1.07        0.96 - 1.17   AC:      282.9  mm     G. Age:  32w 2d         46  %    FL/BPD:     72.2   %    71 - 87  FL:       59.8  mm     G. Age:  31w 1d         10  %    FL/AC:      21.1   %    20 - 24  Est. FW:    1916  gm      4 lb 4 oz     31  %     FW Discordancy     0 \ 21 % ---------------------------------------------------------------------- Gestational Age (Fetus B)  LMP:           32w 3d        Date:  09/06/22                  EDD:   06/13/23  U/S Today:     32w 4d  EDD:   06/12/23  Best:          Armida Sans 3d     Det. By:  LMP  (09/06/22)          EDD:   06/13/23 ---------------------------------------------------------------------- Targeted Anatomy (Fetus B)  Central Nervous System  Calvarium/Cranial V.:  Appears normal         Cereb./Vermis:          Previously seen  Cavum:                 Appears normal         Cisterna Magna:         Previously seen  Lateral Ventricles:    Previously seen        Midline Falx:           Previously seen  Choroid Plexus:        Previously seen  Spine  Cervical:              Previously seen        Sacral:                 Previously seen  Thoracic:              Previously seen        Shape/Curvature:        Previously seen  Lumbar:                Previously seen  Head/Neck  Lips:                  Previously seen        Profile:                Previously seen  Neck:                  Previously seen        Orbits/Eyes:            Previously seen  Nuchal Fold:           Previously seen        Mandible:               Previously seen  Nasal Bone:            Previously seen        Maxilla:                Previously seen  Palate:                Previously seen  Thorax  4 Chamber View:        Previously seen        Interventr. Septum:     Previously seen  Cardiac Rhythm:        Normal                 Cardiac Axis:           Previously een  Cardiac Situs:         Previously seen        Diaphragm:              Appears normal  Rt Outflow Tract:      Previously seen        3  Vessel View:          Previously seen  Lt Outflow Tract:      Previously seen  3 V Trachea View:       Previously seen  Aortic Arch:           Previously seen        IVC:                    Previously seen  Ductal Arch:           Previously seen        Crossing:               Previously seen  SVC:                   Previously seen  Abdomen  Ventral Wall:          Previously seen        Lt Kidney:              Appears normal  Cord Insertion:        Previously seen        Rt Kidney:              Appears normal  Situs:                 Previously seen        Bladder:                Appears normal  Stomach:               Appears normal  Extremities  Lt Humerus:            Previously seen        Lt Femur:               Previously seen  Rt Humerus:            Previously seen        Rt Femur:               Previously seen  Lt Forearm:            Previously seen        Lt Lower Leg:           Previously seen  Rt Forearm:            Previously seen        Rt Lower Leg:           Previously seen  Lt Hand:               Previously seen        Lt Foot:                Previously seen  Rt Hand:               Previously seen        Rt Foot:                Previously seen  Other  Umbilical Cord:        Previously seen        Genitalia:              Female-nml prev ---------------------------------------------------------------------- Doppler - Fetal Vessels (Fetus B)  Umbilical Artery   S/D     %tile      RI    %tile      PI    %tile     PSV    ADFV    RDFV                                                     (  cm/s)   2.63       49    0.62       56     0.9       52     48.5      No      No ---------------------------------------------------------------------- Comments  This patient was seen due to IUGR of twin A in a dichorionic,  diamniotic twin gestation.  She denies any problems since  her last exam and reports feeling vigorous fetal movements  throughout the day.  Twin A: EFW 3 pounds 5 ounces (2nd percentile for her   gestational age).  Polyhydramnios with a maximal vertical  pocket of 9.7 cm.  Doppler studies of the umbilical arteries  showed continued normal forward flow.  There were no signs  of absent or reversed end-diastolic flow noted.  BPP 10 out of  10 with a reactive NST.  Twin B: EFW 4 pounds 4 ounces (31st percentile for her  gestational age).  Normal amniotic fluid.  Doppler studies of  the umbilical arteries showed continued normal forward flow.  There were no signs of absent or reversed end-diastolic flow  noted.  BPP 10 out of 10 with a reactive NST.  Due to IUGR of twin A, she will return in 1 week for another  BPP/NST and umbilical artery Doppler study.  Due to IUGR of twin A in a dichorionic twin gestation, delivery  is recommended at around 36 weeks. ----------------------------------------------------------------------                   Ma Rings, MD Electronically Signed Final Report   04/21/2023 11:30 am ----------------------------------------------------------------------   [place] MFM UA CORD DOPPLER  Result Date: 04/21/2023 ----------------------------------------------------------------------  OBSTETRICS REPORT                       (Signed Final 04/21/2023 11:30 am) ---------------------------------------------------------------------- Patient Info  ID #:       [id number]                          D.O.B.:  Jul 12, 1990 (32 yrs)  Name:       [name]                  Visit Date: 04/21/2023 08:39 am ---------------------------------------------------------------------- Performed By  Attending:        Ma Rings MD         Ref. Address:     [address]                                                             [phone]  [place]                                                             [address]  Performed By:     Marcellina Millin       [facility]  Referred By:      [facility] ---------------------------------------------------------------------- Orders  #  Description                           Code        Ordered By  1  [place] MFM OB FOLLOW UP                   76816.01    YU FANG  2  [place] MFM UA CORD DOPPLER                76820.02    YU FANG  3  [place] MFM FETAL BPP                      60454.0     YU FANG     W/NONSTRESS  4  [place] MFM OB FOLLOW UP ADDL              98119.14    YU FANG     GEST  5  [place] MFM UA ADDL GEST                   76820.01    YU FANG  6  [place] MFM FETAL BPP                      78295.6     YU FANG     W/NONSTRESS ADD'L GEST ----------------------------------------------------------------------  #  Order #                     Accession #                Episode #  1  [id number]  [id number]                 [id number]  2  [id number]                   [id number]                 [id number]  3  [id number]                   [id number]                 [id number]  4  [id number]                   [id number]                 [id number]  5  [id number]                   [id number]                 [id number]  6  [id number]                   [id number]                 [id number] ---------------------------------------------------------------------- Indications  Maternal care for known or suspected poor      O36.5931  fetal growth, third trimester, fetus 1 IUGR  Twin pregnancy, di/di, third trimester         O30.043  Polyhydramnios, third trimester, antepartum    O40.3XX1  condition or complication, fetus 1  Gestational diabetes in pregnancy, diet        O24.410  controlled  Pyelectasis of fetus on prenatal ultrasound    O28.3  (Fetus A- resolved)  Fetal or maternal indication (Anti-Lewis A     O35.8XX0  Antibodies)  [redacted] weeks gestation of  pregnancy                Z3A.32  Low risk NIPS (female/female) ---------------------------------------------------------------------- Vital Signs  BP:          122/78 ---------------------------------------------------------------------- Fetal Evaluation (Fetus A)  Num Of Fetuses:         2  Fetal Heart Rate(bpm):  145  Cardiac Activity:       Observed  Fetal Lie:              Maternal right side  Presentation:           Cephalic  Placenta:               Anterior  P. Cord Insertion:      Previously seen  Membrane Desc:      Dividing Membrane seen - Dichorionic.  Amniotic Fluid  AFI FV:      Polyhydramnios                              Largest Pocket(cm)                              9.7 ---------------------------------------------------------------------- Biophysical Evaluation (Fetus A)  Amniotic F.V:   Pocket => 2 cm             F. Tone:        Observed  F. Movement:    Observed  N.S.T:          Reactive  F. Breathing:   Observed                   Score:          10/10 ---------------------------------------------------------------------- Biometry (Fetus A)  BPD:      76.1  mm     G. Age:  30w 4d          4  %    CI:        78.69   %    70 - 86                                                          FL/HC:      20.2   %    19.1 - 21.3  HC:      271.3  mm     G. Age:  29w 4d        < 1  %    HC/AC:      1.01        0.96 - 1.17  AC:      267.6  mm     G. Age:  30w 6d         11  %    FL/BPD:     72.1   %    71 - 87  FL:       54.9  mm     G. Age:  29w 0d        < 1  %    FL/AC:      20.5   %    20 - 24  Est. FW:    1514  gm      3 lb 5 oz    2.1  %     FW Discordancy        21  % ---------------------------------------------------------------------- OB History  Blood Type:   A+  Gravidity:    7         Term:   2         SAB:   4  Living:       2 ---------------------------------------------------------------------- Gestational Age (Fetus A)  LMP:           32w 3d        Date:  09/06/22                   EDD:   06/13/23  U/S Today:     30w 0d                                        EDD:   06/30/23  Best:          32w 3d     Det. By:  LMP  (09/06/22)          EDD:   06/13/23 ---------------------------------------------------------------------- Targeted Anatomy (Fetus A)  Central Nervous System  Calvarium/Cranial V.:  Appears normal         Cereb./Vermis:          Previously seen  Cavum:  Appears normal         Cisterna Magna:         Previously seen  Lateral Ventricles:    Previously seen        Midline Falx:           Previously seen  Choroid Plexus:        Previously seen  Spine  Cervical:              Previously seen        Sacral:                 Previously seen  Thoracic:              Previously seen        Shape/Curvature:        Previously seen  Lumbar:                Previously seen  Head/Neck  Lips:                  Previously seen        Profile:                Previously seen  Neck:                  Previously seen        Orbits/Eyes:            Previously seen  Nuchal Fold:           Previously seen        Mandible:               Previously seen  Nasal Bone:            Present                Maxilla:                Previously seen  Palate:                Previously seen  Thorax  4 Chamber View:        Previously seen        Interventr. Septum:     Previously seen  Cardiac Rhythm:        Normal                 Cardiac Axis:           Previously een  Cardiac Situs:         Previously seen        Diaphragm:              Appears normal  Rt Outflow Tract:      Previously seen        3 Vessel View:          Previously seen  Lt Outflow Tract:      Previously seen        3 V Trachea View:       Previously seen  Aortic Arch:           Previously seen        IVC:                    Previously seen  Ductal Arch:           Previously seen        Crossing:  Previously seen  SVC:                   Previously seen  Abdomen  Ventral Wall:          Previously seen        Lt Kidney:               Appears normal  Cord Insertion:        Previously seen        Rt Kidney:              Appears normal  Situs:                 Previously seen        Bladder:                Appears normal  Stomach:               Appears normal  Extremities  Lt Humerus:            Previously seen        Lt Femur:               Previously seen  Rt Humerus:            Previously seen        Rt Femur:               Previously seen  Lt Forearm:            Previously seen        Lt Lower Leg:           Previously seen  Rt Forearm:            Previously seen        Rt Lower Leg:           Previously seen  Lt Hand:               Previously seen        Lt Foot:                Previously seen  Rt Hand:               Previously seen        Rt Foot:                Previously seen  Other  Umbilical Cord:        Previously seen        Genitalia:              Female-nml prev ---------------------------------------------------------------------- Doppler - Fetal Vessels (Fetus A)  Umbilical Artery   S/D     %tile      RI    %tile      PI    %tile     PSV    ADFV    RDFV                                                     (cm/s)   3.01       71    0.67       76       1       71    51.58      No  No ---------------------------------------------------------------------- Fetal Evaluation (Fetus B)  Num Of Fetuses:         2  Fetal Heart Rate(bpm):  138  Cardiac Activity:       Observed  Fetal Lie:              Maternal left side  Placenta:               Posterior  P. Cord Insertion:      Previously seen  Membrane Desc:      Dividing Membrane seen - Dichorionic.  Amniotic Fluid  AFI FV:      Within normal limits                              Largest Pocket(cm)                              4.32 ---------------------------------------------------------------------- Biophysical Evaluation (Fetus B)  Amniotic F.V:   Pocket => 2 cm             F. Tone:        Observed  F. Movement:    Observed                   N.S.T:          Reactive  F. Breathing:    Observed                   Score:          10/10 ---------------------------------------------------------------------- Biometry (Fetus B)  BPD:      82.8  mm     G. Age:  33w 2d         69  %    CI:        75.84   %    70 - 86                                                          FL/HC:      19.8   %    19.1 - 21.3  HC:      301.4  mm     G. Age:  33w 3d         39  %    HC/AC:      1.07        0.96 - 1.17  AC:      282.9  mm     G. Age:  32w 2d         46  %    FL/BPD:     72.2   %    71 - 87  FL:       59.8  mm     G. Age:  31w 1d         10  %    FL/AC:      21.1   %    20 - 24  Est. FW:    1916  gm      4 lb 4 oz     31  %     FW Discordancy     0 \ 21 % ---------------------------------------------------------------------- Gestational Age (Fetus B)  LMP:  32w 3d        Date:  09/06/22                  EDD:   06/13/23  U/S Today:     32w 4d                                        EDD:   06/12/23  Best:          32w 3d     Det. By:  LMP  (09/06/22)          EDD:   06/13/23 ---------------------------------------------------------------------- Targeted Anatomy (Fetus B)  Central Nervous System  Calvarium/Cranial V.:  Appears normal         Cereb./Vermis:          Previously seen  Cavum:                 Appears normal         Cisterna Magna:         Previously seen  Lateral Ventricles:    Previously seen        Midline Falx:           Previously seen  Choroid Plexus:        Previously seen  Spine  Cervical:              Previously seen        Sacral:                 Previously seen  Thoracic:              Previously seen        Shape/Curvature:        Previously seen  Lumbar:                Previously seen  Head/Neck  Lips:                  Previously seen        Profile:                Previously seen  Neck:                  Previously seen        Orbits/Eyes:            Previously seen  Nuchal Fold:           Previously seen        Mandible:               Previously seen  Nasal Bone:             Previously seen        Maxilla:                Previously seen  Palate:                Previously seen  Thorax  4 Chamber View:        Previously seen        Interventr. Septum:     Previously seen  Cardiac Rhythm:        Normal                 Cardiac Axis:           Previously een  Cardiac Situs:  Previously seen        Diaphragm:              Appears normal  Rt Outflow Tract:      Previously seen        3 Vessel View:          Previously seen  Lt Outflow Tract:      Previously seen        3 V Trachea View:       Previously seen  Aortic Arch:           Previously seen        IVC:                    Previously seen  Ductal Arch:           Previously seen        Crossing:               Previously seen  SVC:                   Previously seen  Abdomen  Ventral Wall:          Previously seen        Lt Kidney:              Appears normal  Cord Insertion:        Previously seen        Rt Kidney:              Appears normal  Situs:                 Previously seen        Bladder:                Appears normal  Stomach:               Appears normal  Extremities  Lt Humerus:            Previously seen        Lt Femur:               Previously seen  Rt Humerus:            Previously seen        Rt Femur:               Previously seen  Lt Forearm:            Previously seen        Lt Lower Leg:           Previously seen  Rt Forearm:            Previously seen        Rt Lower Leg:           Previously seen  Lt Hand:               Previously seen        Lt Foot:                Previously seen  Rt Hand:               Previously seen        Rt Foot:                Previously seen  Other  Umbilical Cord:        Previously seen  Genitalia:              Female-nml prev ---------------------------------------------------------------------- Doppler - Fetal Vessels (Fetus B)  Umbilical Artery   S/D     %tile      RI    %tile      PI    %tile     PSV    ADFV    RDFV                                                     (cm/s)    2.63       49    0.62       56     0.9       52     48.5      No      No ---------------------------------------------------------------------- Comments  This patient was seen due to IUGR of twin A in a dichorionic,  diamniotic twin gestation.  She denies any problems since  her last exam and reports feeling vigorous fetal movements  throughout the day.  Twin A: EFW 3 pounds 5 ounces (2nd percentile for her  gestational age).  Polyhydramnios with a maximal vertical  pocket of 9.7 cm.  Doppler studies of the umbilical arteries  showed continued normal forward flow.  There were no signs  of absent or reversed end-diastolic flow noted.  BPP 10 out of  10 with a reactive NST.  Twin B: EFW 4 pounds 4 ounces (31st percentile for her  gestational age).  Normal amniotic fluid.  Doppler studies of  the umbilical arteries showed continued normal forward flow.  There were no signs of absent or reversed end-diastolic flow  noted.  BPP 10 out of 10 with a reactive NST.  Due to IUGR of twin A, she will return in 1 week for another  BPP/NST and umbilical artery Doppler study.  Due to IUGR of twin A in a dichorionic twin gestation, delivery  is recommended at around 36 weeks. ----------------------------------------------------------------------                   Ma Rings, MD Electronically Signed Final Report   04/21/2023 11:30 am ----------------------------------------------------------------------   [place] MFM FETAL BPP W/NONSTRESS  Result Date: 04/21/2023 ----------------------------------------------------------------------  OBSTETRICS REPORT                       (Signed Final 04/21/2023 11:30 am) ---------------------------------------------------------------------- Patient Info  ID #:       [id number]                          D.O.B.:  1991-05-28 (32 yrs)  Name:       [name]                  Visit Date: 04/21/2023 08:39 am ---------------------------------------------------------------------- Performed By   Attending:        Ma Rings MD         Ref. Address:     [address]  [address]                                                             [address]                                                             [place]                                                             [address]  Performed By:     Marcellina Millin       [facility]  Referred By:      [facility] ---------------------------------------------------------------------- Orders  #  Description                           Code        Ordered By  1  [place] MFM OB FOLLOW UP                   76816.01    YU FANG  2  [place] MFM UA CORD DOPPLER                76820.02    YU FANG  3  [place] MFM FETAL BPP                      21308.6     YU FANG     W/NONSTRESS  4  [place] MFM OB FOLLOW UP ADDL              57846.96    YU FANG     GEST  5  [place] MFM UA ADDL GEST  76820.01    YU FANG  6  [place] MFM FETAL BPP                      08657.8     YU FANG     W/NONSTRESS ADD'L GEST ----------------------------------------------------------------------  #  Order #                     Accession #                Episode #  1  [id number]                   [id number]                 [id number]  2  [id number]                   [id number]                 [id number]  3  [id number]                   [id number]                 [id number]  4  [id number]                   [id number]                 [id number]  5  [id number]                   [id number]                 [id number]  6  [id number]                   [id number]                 [id number] ---------------------------------------------------------------------- Indications  Maternal care for known or suspected poor      O36.5931  fetal growth,  third trimester, fetus 1 IUGR  Twin pregnancy, di/di, third trimester         O30.043  Polyhydramnios, third trimester, antepartum    O40.3XX1  condition or complication, fetus 1  Gestational diabetes in pregnancy, diet        O24.410  controlled  Pyelectasis of fetus on prenatal ultrasound    O28.3  (Fetus A- resolved)  Fetal or maternal indication (Anti-Lewis A     O35.8XX0  Antibodies)  [redacted] weeks gestation of pregnancy                Z3A.32  Low risk NIPS (female/female) ---------------------------------------------------------------------- Vital Signs  BP:          122/78 ---------------------------------------------------------------------- Fetal Evaluation (Fetus A)  Num Of Fetuses:         2  Fetal Heart Rate(bpm):  145  Cardiac Activity:       Observed  Fetal Lie:              Maternal right side  Presentation:           Cephalic  Placenta:               Anterior  P. Cord Insertion:      Previously seen  Membrane Desc:      Dividing Membrane seen - Dichorionic.  Amniotic Fluid  AFI FV:      Polyhydramnios  Largest Pocket(cm)                              9.7 ---------------------------------------------------------------------- Biophysical Evaluation (Fetus A)  Amniotic F.V:   Pocket => 2 cm             F. Tone:        Observed  F. Movement:    Observed                   N.S.T:          Reactive  F. Breathing:   Observed                   Score:          10/10 ---------------------------------------------------------------------- Biometry (Fetus A)  BPD:      76.1  mm     G. Age:  30w 4d          4  %    CI:        78.69   %    70 - 86                                                          FL/HC:      20.2   %    19.1 - 21.3  HC:      271.3  mm     G. Age:  29w 4d        < 1  %    HC/AC:      1.01        0.96 - 1.17  AC:      267.6  mm     G. Age:  30w 6d         11  %    FL/BPD:     72.1   %    71 - 87  FL:       54.9  mm     G. Age:  29w 0d        < 1  %    FL/AC:      20.5   %     20 - 24  Est. FW:    1514  gm      3 lb 5 oz    2.1  %     FW Discordancy        21  % ---------------------------------------------------------------------- OB History  Blood Type:   A+  Gravidity:    7         Term:   2         SAB:   4  Living:       2 ---------------------------------------------------------------------- Gestational Age (Fetus A)  LMP:           32w 3d        Date:  09/06/22                  EDD:   06/13/23  U/S Today:     30w 0d  EDD:   06/30/23  Best:          Armida Sans 3d     Det. By:  LMP  (09/06/22)          EDD:   06/13/23 ---------------------------------------------------------------------- Targeted Anatomy (Fetus A)  Central Nervous System  Calvarium/Cranial V.:  Appears normal         Cereb./Vermis:          Previously seen  Cavum:                 Appears normal         Cisterna Magna:         Previously seen  Lateral Ventricles:    Previously seen        Midline Falx:           Previously seen  Choroid Plexus:        Previously seen  Spine  Cervical:              Previously seen        Sacral:                 Previously seen  Thoracic:              Previously seen        Shape/Curvature:        Previously seen  Lumbar:                Previously seen  Head/Neck  Lips:                  Previously seen        Profile:                Previously seen  Neck:                  Previously seen        Orbits/Eyes:            Previously seen  Nuchal Fold:           Previously seen        Mandible:               Previously seen  Nasal Bone:            Present                Maxilla:                Previously seen  Palate:                Previously seen  Thorax  4 Chamber View:        Previously seen        Interventr. Septum:     Previously seen  Cardiac Rhythm:        Normal                 Cardiac Axis:           Previously een  Cardiac Situs:         Previously seen        Diaphragm:              Appears normal  Rt Outflow Tract:      Previously seen        3  Vessel View:          Previously seen  Lt Outflow Tract:      Previously  seen        3 V Trachea View:       Previously seen  Aortic Arch:           Previously seen        IVC:                    Previously seen  Ductal Arch:           Previously seen        Crossing:               Previously seen  SVC:                   Previously seen  Abdomen  Ventral Wall:          Previously seen        Lt Kidney:              Appears normal  Cord Insertion:        Previously seen        Rt Kidney:              Appears normal  Situs:                 Previously seen        Bladder:                Appears normal  Stomach:               Appears normal  Extremities  Lt Humerus:            Previously seen        Lt Femur:               Previously seen  Rt Humerus:            Previously seen        Rt Femur:               Previously seen  Lt Forearm:            Previously seen        Lt Lower Leg:           Previously seen  Rt Forearm:            Previously seen        Rt Lower Leg:           Previously seen  Lt Hand:               Previously seen        Lt Foot:                Previously seen  Rt Hand:               Previously seen        Rt Foot:                Previously seen  Other  Umbilical Cord:        Previously seen        Genitalia:              Female-nml prev ---------------------------------------------------------------------- Doppler - Fetal Vessels (Fetus A)  Umbilical Artery   S/D     %tile      RI    %tile      PI    %tile     PSV    ADFV  RDFV                                                     (cm/s)   3.01       71    0.67       76       1       71    51.58      No      No ---------------------------------------------------------------------- Fetal Evaluation (Fetus B)  Num Of Fetuses:         2  Fetal Heart Rate(bpm):  138  Cardiac Activity:       Observed  Fetal Lie:              Maternal left side  Placenta:               Posterior  P. Cord Insertion:      Previously seen  Membrane Desc:      Dividing  Membrane seen - Dichorionic.  Amniotic Fluid  AFI FV:      Within normal limits                              Largest Pocket(cm)                              4.32 ---------------------------------------------------------------------- Biophysical Evaluation (Fetus B)  Amniotic F.V:   Pocket => 2 cm             F. Tone:        Observed  F. Movement:    Observed                   N.S.T:          Reactive  F. Breathing:   Observed                   Score:          10/10 ---------------------------------------------------------------------- Biometry (Fetus B)  BPD:      82.8  mm     G. Age:  33w 2d         69  %    CI:        75.84   %    70 - 86                                                          FL/HC:      19.8   %    19.1 - 21.3  HC:      301.4  mm     G. Age:  33w 3d         39  %    HC/AC:      1.07        0.96 - 1.17  AC:      282.9  mm     G. Age:  32w 2d         46  %    FL/BPD:     72.2   %  71 - 87  FL:       59.8  mm     G. Age:  31w 1d         10  %    FL/AC:      21.1   %    20 - 24  Est. FW:    1916  gm      4 lb 4 oz     31  %     FW Discordancy     0 \ 21 % ---------------------------------------------------------------------- Gestational Age (Fetus B)  LMP:           32w 3d        Date:  09/06/22                  EDD:   06/13/23  U/S Today:     32w 4d                                        EDD:   06/12/23  Best:          32w 3d     Det. By:  LMP  (09/06/22)          EDD:   06/13/23 ---------------------------------------------------------------------- Targeted Anatomy (Fetus B)  Central Nervous System  Calvarium/Cranial V.:  Appears normal         Cereb./Vermis:          Previously seen  Cavum:                 Appears normal         Cisterna Magna:         Previously seen  Lateral Ventricles:    Previously seen        Midline Falx:           Previously seen  Choroid Plexus:        Previously seen  Spine  Cervical:              Previously seen        Sacral:                 Previously seen   Thoracic:              Previously seen        Shape/Curvature:        Previously seen  Lumbar:                Previously seen  Head/Neck  Lips:                  Previously seen        Profile:                Previously seen  Neck:                  Previously seen        Orbits/Eyes:            Previously seen  Nuchal Fold:           Previously seen        Mandible:               Previously seen  Nasal Bone:            Previously seen        Maxilla:  Previously seen  Palate:                Previously seen  Thorax  4 Chamber View:        Previously seen        Interventr. Septum:     Previously seen  Cardiac Rhythm:        Normal                 Cardiac Axis:           Previously een  Cardiac Situs:         Previously seen        Diaphragm:              Appears normal  Rt Outflow Tract:      Previously seen        3 Vessel View:          Previously seen  Lt Outflow Tract:      Previously seen        3 V Trachea View:       Previously seen  Aortic Arch:           Previously seen        IVC:                    Previously seen  Ductal Arch:           Previously seen        Crossing:               Previously seen  SVC:                   Previously seen  Abdomen  Ventral Wall:          Previously seen        Lt Kidney:              Appears normal  Cord Insertion:        Previously seen        Rt Kidney:              Appears normal  Situs:                 Previously seen        Bladder:                Appears normal  Stomach:               Appears normal  Extremities  Lt Humerus:            Previously seen        Lt Femur:               Previously seen  Rt Humerus:            Previously seen        Rt Femur:               Previously seen  Lt Forearm:            Previously seen        Lt Lower Leg:           Previously seen  Rt Forearm:            Previously seen        Rt Lower Leg:           Previously seen  Lt Hand:  Previously seen        Lt Foot:                Previously seen  Rt Hand:                Previously seen        Rt Foot:                Previously seen  Other  Umbilical Cord:        Previously seen        Genitalia:              Female-nml prev ---------------------------------------------------------------------- Doppler - Fetal Vessels (Fetus B)  Umbilical Artery   S/D     %tile      RI    %tile      PI    %tile     PSV    ADFV    RDFV                                                     (cm/s)   2.63       49    0.62       56     0.9       52     48.5      No      No ---------------------------------------------------------------------- Comments  This patient was seen due to IUGR of twin A in a dichorionic,  diamniotic twin gestation.  She denies any problems since  her last exam and reports feeling vigorous fetal movements  throughout the day.  Twin A: EFW 3 pounds 5 ounces (2nd percentile for her  gestational age).  Polyhydramnios with a maximal vertical  pocket of 9.7 cm.  Doppler studies of the umbilical arteries  showed continued normal forward flow.  There were no signs  of absent or reversed end-diastolic flow noted.  BPP 10 out of  10 with a reactive NST.  Twin B: EFW 4 pounds 4 ounces (31st percentile for her  gestational age).  Normal amniotic fluid.  Doppler studies of  the umbilical arteries showed continued normal forward flow.  There were no signs of absent or reversed end-diastolic flow  noted.  BPP 10 out of 10 with a reactive NST.  Due to IUGR of twin A, she will return in 1 week for another  BPP/NST and umbilical artery Doppler study.  Due to IUGR of twin A in a dichorionic twin gestation, delivery  is recommended at around 36 weeks. ----------------------------------------------------------------------                   Ma Rings, MD Electronically Signed Final Report   04/21/2023 11:30 am ----------------------------------------------------------------------   [place] MFM OB FOLLOW UP ADDL GEST  Result Date:  04/21/2023 ----------------------------------------------------------------------  OBSTETRICS REPORT                       (Signed Final 04/21/2023 11:30 am) ---------------------------------------------------------------------- Patient Info  ID #:       [id number]                          D.O.B.:  10-12-1990 (32 yrs)  Name:       [name]  Visit Date: 04/21/2023 08:39 am ---------------------------------------------------------------------- Performed By  Attending:        Ma Rings MD         Ref. Address:     [address]                                                             [address]                                                             [place]                                                             [address]  Performed By:     Marcellina Millin       [facility]  Referred By:      [facility] ---------------------------------------------------------------------- Orders  #  Description                           Code        Ordered By  1  [place] MFM OB FOLLOW UP                   76816.01    YU FANG  2  [place] MFM UA CORD DOPPLER                [phone]  YU FANG  3  [place] MFM FETAL BPP                      [id number]     YU FANG     W/NONSTRESS  4  [place] MFM OB FOLLOW UP ADDL              [id number]    YU FANG     GEST  5  [place] MFM UA ADDL GEST                   76820.01    YU FANG  6  [place] MFM FETAL BPP                      16109.6     YU FANG     W/NONSTRESS ADD'L GEST ----------------------------------------------------------------------  #  Order #                     Accession #                Episode #  1  [id number]                   [id number]                 [id number]  2  [id number]                    [id number]                 [id number]  3  [id number]                   [id number]                 [id number]  4  [id number]                   [id number]                 [id number]  5  [id number]                   [id number]                 [id number]  6  [id number]                   [id number]                 [id number] ---------------------------------------------------------------------- Indications  Maternal care for known or suspected poor      O36.5931  fetal growth, third trimester, fetus 1 IUGR  Twin pregnancy, di/di, third trimester         O30.043  Polyhydramnios, third trimester, antepartum    O40.3XX1  condition or complication, fetus 1  Gestational diabetes in pregnancy, diet        O24.410  controlled  Pyelectasis of fetus on prenatal ultrasound    O28.3  (Fetus A- resolved)  Fetal or maternal indication (Anti-Lewis A     O35.8XX0  Antibodies)  [redacted] weeks gestation of pregnancy                Z3A.32  Low risk NIPS (female/female) ---------------------------------------------------------------------- Vital Signs  BP:          122/78 ---------------------------------------------------------------------- Fetal Evaluation (Fetus A)  Num Of Fetuses:         2  Fetal Heart Rate(bpm):  145  Cardiac Activity:       Observed  Fetal Lie:              Maternal right side  Presentation:           Cephalic  Placenta:               Anterior  P. Cord Insertion:      Previously seen  Membrane Desc:      Dividing Membrane seen - Dichorionic.  Amniotic Fluid  AFI FV:      Polyhydramnios                              Largest Pocket(cm)                              9.7 ---------------------------------------------------------------------- Biophysical Evaluation (Fetus A)  Amniotic F.V:   Pocket => 2 cm             F. Tone:        Observed  F. Movement:    Observed                   N.S.T:          Reactive  F. Breathing:   Observed                   Score:          10/10  ---------------------------------------------------------------------- Biometry (Fetus A)  BPD:      76.1  mm     G. Age:  30w 4d          4  %    CI:        78.69   %    70 - 86                                                          FL/HC:      20.2   %    19.1 - 21.3  HC:      271.3  mm     G. Age:  29w 4d        < 1  %    HC/AC:      1.01        0.96 - 1.17  AC:      267.6  mm     G. Age:  30w 6d         11  %    FL/BPD:     72.1   %    71 - 87  FL:       54.9  mm     G. Age:  29w 0d        < 1  %    FL/AC:      20.5   %    20 - 24  Est. FW:    1514  gm      3 lb 5 oz    2.1  %     FW Discordancy        21  % ---------------------------------------------------------------------- OB History  Blood Type:   A+  Gravidity:    7  Term:   2         SAB:   4  Living:       2 ---------------------------------------------------------------------- Gestational Age (Fetus A)  LMP:           32w 3d        Date:  09/06/22                  EDD:   06/13/23  U/S Today:     30w 0d                                        EDD:   06/30/23  Best:          32w 3d     Det. By:  LMP  (09/06/22)          EDD:   06/13/23 ---------------------------------------------------------------------- Targeted Anatomy (Fetus A)  Central Nervous System  Calvarium/Cranial V.:  Appears normal         Cereb./Vermis:          Previously seen  Cavum:                 Appears normal         Cisterna Magna:         Previously seen  Lateral Ventricles:    Previously seen        Midline Falx:           Previously seen  Choroid Plexus:        Previously seen  Spine  Cervical:              Previously seen        Sacral:                 Previously seen  Thoracic:              Previously seen        Shape/Curvature:        Previously seen  Lumbar:                Previously seen  Head/Neck  Lips:                  Previously seen        Profile:                Previously seen  Neck:                  Previously seen        Orbits/Eyes:            Previously seen   Nuchal Fold:           Previously seen        Mandible:               Previously seen  Nasal Bone:            Present                Maxilla:                Previously seen  Palate:                Previously seen  Thorax  4 Chamber View:        Previously seen        Interventr. Septum:  Previously seen  Cardiac Rhythm:        Normal                 Cardiac Axis:           Previously een  Cardiac Situs:         Previously seen        Diaphragm:              Appears normal  Rt Outflow Tract:      Previously seen        3 Vessel View:          Previously seen  Lt Outflow Tract:      Previously seen        3 V Trachea View:       Previously seen  Aortic Arch:           Previously seen        IVC:                    Previously seen  Ductal Arch:           Previously seen        Crossing:               Previously seen  SVC:                   Previously seen  Abdomen  Ventral Wall:          Previously seen        Lt Kidney:              Appears normal  Cord Insertion:        Previously seen        Rt Kidney:              Appears normal  Situs:                 Previously seen        Bladder:                Appears normal  Stomach:               Appears normal  Extremities  Lt Humerus:            Previously seen        Lt Femur:               Previously seen  Rt Humerus:            Previously seen        Rt Femur:               Previously seen  Lt Forearm:            Previously seen        Lt Lower Leg:           Previously seen  Rt Forearm:            Previously seen        Rt Lower Leg:           Previously seen  Lt Hand:               Previously seen        Lt Foot:                Previously seen  Rt Hand:  Previously seen        Rt Foot:                Previously seen  Other  Umbilical Cord:        Previously seen        Genitalia:              Female-nml prev ---------------------------------------------------------------------- Doppler - Fetal Vessels (Fetus A)  Umbilical Artery   S/D     %tile      RI     %tile      PI    %tile     PSV    ADFV    RDFV                                                     (cm/s)   3.01       71    0.67       76       1       71    51.58      No      No ---------------------------------------------------------------------- Fetal Evaluation (Fetus B)  Num Of Fetuses:         2  Fetal Heart Rate(bpm):  138  Cardiac Activity:       Observed  Fetal Lie:              Maternal left side  Placenta:               Posterior  P. Cord Insertion:      Previously seen  Membrane Desc:      Dividing Membrane seen - Dichorionic.  Amniotic Fluid  AFI FV:      Within normal limits                              Largest Pocket(cm)                              4.32 ---------------------------------------------------------------------- Biophysical Evaluation (Fetus B)  Amniotic F.V:   Pocket => 2 cm             F. Tone:        Observed  F. Movement:    Observed                   N.S.T:          Reactive  F. Breathing:   Observed                   Score:          10/10 ---------------------------------------------------------------------- Biometry (Fetus B)  BPD:      82.8  mm     G. Age:  33w 2d         69  %    CI:        75.84   %    70 - 86  FL/HC:      19.8   %    19.1 - 21.3  HC:      301.4  mm     G. Age:  33w 3d         39  %    HC/AC:      1.07        0.96 - 1.17  AC:      282.9  mm     G. Age:  32w 2d         46  %    FL/BPD:     72.2   %    71 - 87  FL:       59.8  mm     G. Age:  31w 1d         10  %    FL/AC:      21.1   %    20 - 24  Est. FW:    1916  gm      4 lb 4 oz     31  %     FW Discordancy     0 \ 21 % ---------------------------------------------------------------------- Gestational Age (Fetus B)  LMP:           32w 3d        Date:  09/06/22                  EDD:   06/13/23  U/S Today:     32w 4d                                        EDD:   06/12/23  Best:          32w 3d     Det. By:  LMP  (09/06/22)          EDD:   06/13/23  ---------------------------------------------------------------------- Targeted Anatomy (Fetus B)  Central Nervous System  Calvarium/Cranial V.:  Appears normal         Cereb./Vermis:          Previously seen  Cavum:                 Appears normal         Cisterna Magna:         Previously seen  Lateral Ventricles:    Previously seen        Midline Falx:           Previously seen  Choroid Plexus:        Previously seen  Spine  Cervical:              Previously seen        Sacral:                 Previously seen  Thoracic:              Previously seen        Shape/Curvature:        Previously seen  Lumbar:                Previously seen  Head/Neck  Lips:                  Previously seen        Profile:  Previously seen  Neck:                  Previously seen        Orbits/Eyes:            Previously seen  Nuchal Fold:           Previously seen        Mandible:               Previously seen  Nasal Bone:            Previously seen        Maxilla:                Previously seen  Palate:                Previously seen  Thorax  4 Chamber View:        Previously seen        Interventr. Septum:     Previously seen  Cardiac Rhythm:        Normal                 Cardiac Axis:           Previously een  Cardiac Situs:         Previously seen        Diaphragm:              Appears normal  Rt Outflow Tract:      Previously seen        3 Vessel View:          Previously seen  Lt Outflow Tract:      Previously seen        3 V Trachea View:       Previously seen  Aortic Arch:           Previously seen        IVC:                    Previously seen  Ductal Arch:           Previously seen        Crossing:               Previously seen  SVC:                   Previously seen  Abdomen  Ventral Wall:          Previously seen        Lt Kidney:              Appears normal  Cord Insertion:        Previously seen        Rt Kidney:              Appears normal  Situs:                 Previously seen        Bladder:                 Appears normal  Stomach:               Appears normal  Extremities  Lt Humerus:            Previously seen        Lt Femur:               Previously seen  Rt Humerus:            Previously seen        Rt Femur:               Previously seen  Lt Forearm:            Previously seen        Lt Lower Leg:           Previously seen  Rt Forearm:            Previously seen        Rt Lower Leg:           Previously seen  Lt Hand:               Previously seen        Lt Foot:                Previously seen  Rt Hand:               Previously seen        Rt Foot:                Previously seen  Other  Umbilical Cord:        Previously seen        Genitalia:              Female-nml prev ---------------------------------------------------------------------- Doppler - Fetal Vessels (Fetus B)  Umbilical Artery   S/D     %tile      RI    %tile      PI    %tile     PSV    ADFV    RDFV                                                     (cm/s)   2.63       49    0.62       56     0.9       52     48.5      No      No ---------------------------------------------------------------------- Comments  This patient was seen due to IUGR of twin A in a dichorionic,  diamniotic twin gestation.  She denies any problems since  her last exam and reports feeling vigorous fetal movements  throughout the day.  Twin A: EFW 3 pounds 5 ounces (2nd percentile for her  gestational age).  Polyhydramnios with a maximal vertical  pocket of 9.7 cm.  Doppler studies of the umbilical arteries  showed continued normal forward flow.  There were no signs  of absent or reversed end-diastolic flow noted.  BPP 10 out of  10 with a reactive NST.  Twin B: EFW 4 pounds 4 ounces (31st percentile for her  gestational age).  Normal amniotic fluid.  Doppler studies of  the umbilical arteries showed continued normal forward flow.  There were no signs of absent or reversed end-diastolic flow  noted.  BPP 10 out of 10 with a reactive NST.  Due to IUGR of twin A, she will return  in 1 week for another  BPP/NST and umbilical artery Doppler study.  Due to IUGR of twin A in a dichorionic twin gestation, delivery  is recommended at around 36 weeks. ----------------------------------------------------------------------  Ma Rings, MD Electronically Signed Final Report   04/21/2023 11:30 am ----------------------------------------------------------------------   [place] MFM UA ADDL GEST  Result Date: 04/21/2023 ----------------------------------------------------------------------  OBSTETRICS REPORT                       (Signed Final 04/21/2023 11:30 am) ---------------------------------------------------------------------- Patient Info  ID #:       [id number]                          D.O.B.:  February 20, 1991 (32 yrs)  Name:       [name]                  Visit Date: 04/21/2023 08:39 am ---------------------------------------------------------------------- Performed By  Attending:        Ma Rings MD         Ref. Address:     [address]                                                             [address]                                                             [place]                                                             [address]  Performed By:     Marcellina Millin       [facility]                                     [facility]  Referred By:      [facility] ---------------------------------------------------------------------- Orders  #  Description                           Code        Ordered By  1  [place] MFM OB FOLLOW UP                   [id number]    YU FANG  2  [place] MFM UA CORD DOPPLER                76820.02    YU FANG  3  [place] MFM FETAL BPP                      76818.5     YU FANG     W/NONSTRESS  4  [place] MFM  OB FOLLOW UP ADDL              [id number]    YU FANG     GEST  5  [place] MFM UA ADDL GEST                   76820.01    YU FANG  6  [place] MFM FETAL BPP                      25956.3     YU FANG     W/NONSTRESS ADD'L GEST ----------------------------------------------------------------------  #  Order #                     Accession #                Episode #  1  [id number]                   [id number]                 [id number]  2  [id number]                   [id number]                 [id number]  3  [id number]                   [id number]                 [id number]  4  [id number]                   [id number]                 [id number]  5  [id number]                   [id number]                 [id number]  6  [id number]                   [id number]                 [id number] ---------------------------------------------------------------------- Indications  Maternal care for known or suspected poor      O36.5931  fetal growth, third trimester, fetus 1 IUGR  Twin pregnancy, di/di, third trimester         O30.043  Polyhydramnios, third trimester, antepartum    O40.3XX1  condition or complication, fetus  1  Gestational diabetes in pregnancy, diet        O24.410  controlled  Pyelectasis of fetus on prenatal ultrasound    O28.3  (Fetus A- resolved)  Fetal or maternal indication (Anti-Lewis A     O35.8XX0  Antibodies)  [redacted] weeks gestation of pregnancy                Z3A.32  Low risk NIPS (female/female) ---------------------------------------------------------------------- Vital Signs  BP:          122/78 ---------------------------------------------------------------------- Fetal Evaluation (Fetus A)  Num Of Fetuses:         2  Fetal Heart Rate(bpm):  145  Cardiac Activity:       Observed  Fetal Lie:              Maternal right side  Presentation:           Cephalic  Placenta:               Anterior  P. Cord Insertion:      Previously seen  Membrane Desc:      Dividing Membrane seen - Dichorionic.  Amniotic Fluid  AFI FV:      Polyhydramnios                               Largest Pocket(cm)                              9.7 ---------------------------------------------------------------------- Biophysical Evaluation (Fetus A)  Amniotic F.V:   Pocket => 2 cm             F. Tone:        Observed  F. Movement:    Observed                   N.S.T:          Reactive  F. Breathing:   Observed                   Score:          10/10 ---------------------------------------------------------------------- Biometry (Fetus A)  BPD:      76.1  mm     G. Age:  30w 4d          4  %    CI:        78.69   %    70 - 86                                                          FL/HC:      20.2   %    19.1 - 21.3  HC:      271.3  mm     G. Age:  29w 4d        < 1  %    HC/AC:      1.01        0.96 - 1.17  AC:      267.6  mm     G. Age:  30w 6d         11  %    FL/BPD:     72.1   %  71 - 87  FL:       54.9  mm     G. Age:  29w 0d        < 1  %    FL/AC:      20.5   %    20 - 24  Est. FW:    1514  gm      3 lb 5 oz    2.1  %     FW Discordancy        21  % ---------------------------------------------------------------------- OB History  Blood Type:   A+  Gravidity:    7         Term:   2         SAB:   4  Living:       2 ---------------------------------------------------------------------- Gestational Age (Fetus A)  LMP:           32w 3d        Date:  09/06/22                  EDD:   06/13/23  U/S Today:     30w 0d                                        EDD:   06/30/23  Best:          32w 3d     Det. By:  LMP  (09/06/22)          EDD:   06/13/23 ---------------------------------------------------------------------- Targeted Anatomy (Fetus A)  Central Nervous System  Calvarium/Cranial V.:  Appears normal         Cereb./Vermis:          Previously seen  Cavum:                 Appears normal         Cisterna Magna:         Previously seen  Lateral Ventricles:    Previously seen        Midline Falx:           Previously seen  Choroid Plexus:        Previously seen  Spine  Cervical:               Previously seen        Sacral:                 Previously seen  Thoracic:              Previously seen        Shape/Curvature:        Previously seen  Lumbar:                Previously seen  Head/Neck  Lips:                  Previously seen        Profile:                Previously seen  Neck:                  Previously seen        Orbits/Eyes:            Previously seen  Nuchal Fold:           Previously seen  Mandible:               Previously seen  Nasal Bone:            Present                Maxilla:                Previously seen  Palate:                Previously seen  Thorax  4 Chamber View:        Previously seen        Interventr. Septum:     Previously seen  Cardiac Rhythm:        Normal                 Cardiac Axis:           Previously een  Cardiac Situs:         Previously seen        Diaphragm:              Appears normal  Rt Outflow Tract:      Previously seen        3 Vessel View:          Previously seen  Lt Outflow Tract:      Previously seen        3 V Trachea View:       Previously seen  Aortic Arch:           Previously seen        IVC:                    Previously seen  Ductal Arch:           Previously seen        Crossing:               Previously seen  SVC:                   Previously seen  Abdomen  Ventral Wall:          Previously seen        Lt Kidney:              Appears normal  Cord Insertion:        Previously seen        Rt Kidney:              Appears normal  Situs:                 Previously seen        Bladder:                Appears normal  Stomach:               Appears normal  Extremities  Lt Humerus:            Previously seen        Lt Femur:               Previously seen  Rt Humerus:            Previously seen        Rt Femur:               Previously seen  Lt Forearm:            Previously seen  Lt Lower Leg:           Previously seen  Rt Forearm:            Previously seen        Rt Lower Leg:           Previously seen  Lt Hand:                Previously seen        Lt Foot:                Previously seen  Rt Hand:               Previously seen        Rt Foot:                Previously seen  Other  Umbilical Cord:        Previously seen        Genitalia:              Female-nml prev ---------------------------------------------------------------------- Doppler - Fetal Vessels (Fetus A)  Umbilical Artery   S/D     %tile      RI    %tile      PI    %tile     PSV    ADFV    RDFV                                                     (cm/s)   3.01       71    0.67       76       1       71    51.58      No      No ---------------------------------------------------------------------- Fetal Evaluation (Fetus B)  Num Of Fetuses:         2  Fetal Heart Rate(bpm):  138  Cardiac Activity:       Observed  Fetal Lie:              Maternal left side  Placenta:               Posterior  P. Cord Insertion:      Previously seen  Membrane Desc:      Dividing Membrane seen - Dichorionic.  Amniotic Fluid  AFI FV:      Within normal limits                              Largest Pocket(cm)                              4.32 ---------------------------------------------------------------------- Biophysical Evaluation (Fetus B)  Amniotic F.V:   Pocket => 2 cm             F. Tone:        Observed  F. Movement:    Observed                   N.S.T:          Reactive  F. Breathing:   Observed  Score:          10/10 ---------------------------------------------------------------------- Biometry (Fetus B)  BPD:      82.8  mm     G. Age:  33w 2d         69  %    CI:        75.84   %    70 - 86                                                          FL/HC:      19.8   %    19.1 - 21.3  HC:      301.4  mm     G. Age:  33w 3d         39  %    HC/AC:      1.07        0.96 - 1.17  AC:      282.9  mm     G. Age:  32w 2d         46  %    FL/BPD:     72.2   %    71 - 87  FL:       59.8  mm     G. Age:  31w 1d         10  %    FL/AC:      21.1   %    20 - 24  Est. FW:    1916  gm       4 lb 4 oz     31  %     FW Discordancy     0 \ 21 % ---------------------------------------------------------------------- Gestational Age (Fetus B)  LMP:           32w 3d        Date:  09/06/22                  EDD:   06/13/23  U/S Today:     32w 4d                                        EDD:   06/12/23  Best:          32w 3d     Det. By:  LMP  (09/06/22)          EDD:   06/13/23 ---------------------------------------------------------------------- Targeted Anatomy (Fetus B)  Central Nervous System  Calvarium/Cranial V.:  Appears normal         Cereb./Vermis:          Previously seen  Cavum:                 Appears normal         Cisterna Magna:         Previously seen  Lateral Ventricles:    Previously seen        Midline Falx:           Previously seen  Choroid Plexus:        Previously seen  Spine  Cervical:              Previously seen  Sacral:                 Previously seen  Thoracic:              Previously seen        Shape/Curvature:        Previously seen  Lumbar:                Previously seen  Head/Neck  Lips:                  Previously seen        Profile:                Previously seen  Neck:                  Previously seen        Orbits/Eyes:            Previously seen  Nuchal Fold:           Previously seen        Mandible:               Previously seen  Nasal Bone:            Previously seen        Maxilla:                Previously seen  Palate:                Previously seen  Thorax  4 Chamber View:        Previously seen        Interventr. Septum:     Previously seen  Cardiac Rhythm:        Normal                 Cardiac Axis:           Previously een  Cardiac Situs:         Previously seen        Diaphragm:              Appears normal  Rt Outflow Tract:      Previously seen        3 Vessel View:          Previously seen  Lt Outflow Tract:      Previously seen        3 V Trachea View:       Previously seen  Aortic Arch:           Previously seen        IVC:                     Previously seen  Ductal Arch:           Previously seen        Crossing:               Previously seen  SVC:                   Previously seen  Abdomen  Ventral Wall:          Previously seen        Lt Kidney:              Appears normal  Cord Insertion:        Previously seen        Rt Kidney:  Appears normal  Situs:                 Previously seen        Bladder:                Appears normal  Stomach:               Appears normal  Extremities  Lt Humerus:            Previously seen        Lt Femur:               Previously seen  Rt Humerus:            Previously seen        Rt Femur:               Previously seen  Lt Forearm:            Previously seen        Lt Lower Leg:           Previously seen  Rt Forearm:            Previously seen        Rt Lower Leg:           Previously seen  Lt Hand:               Previously seen        Lt Foot:                Previously seen  Rt Hand:               Previously seen        Rt Foot:                Previously seen  Other  Umbilical Cord:        Previously seen        Genitalia:              Female-nml prev ---------------------------------------------------------------------- Doppler - Fetal Vessels (Fetus B)  Umbilical Artery   S/D     %tile      RI    %tile      PI    %tile     PSV    ADFV    RDFV                                                     (cm/s)   2.63       49    0.62       56     0.9       52     48.5      No      No ---------------------------------------------------------------------- Comments  This patient was seen due to IUGR of twin A in a dichorionic,  diamniotic twin gestation.  She denies any problems since  her last exam and reports feeling vigorous fetal movements  throughout the day.  Twin A: EFW 3 pounds 5 ounces (2nd percentile for her  gestational age).  Polyhydramnios with a maximal vertical  pocket of 9.7 cm.  Doppler studies of the umbilical arteries  showed continued normal forward flow.  There were no signs  of absent or reversed  end-diastolic flow noted.  BPP 10 out of  10 with a reactive NST.  Twin B: EFW 4 pounds 4 ounces (31st percentile for her  gestational age).  Normal amniotic fluid.  Doppler studies of  the umbilical arteries showed continued normal forward flow.  There were no signs of absent or reversed end-diastolic flow  noted.  BPP 10 out of 10 with a reactive NST.  Due to IUGR of twin A, she will return in 1 week for another  BPP/NST and umbilical artery Doppler study.  Due to IUGR of twin A in a dichorionic twin gestation, delivery  is recommended at around 36 weeks. ----------------------------------------------------------------------                   Ma Rings, MD Electronically Signed Final Report   04/21/2023 11:30 am ----------------------------------------------------------------------   [place] MFM FETAL BPP W/NONSTRESS ADD'L GEST  Result Date: 04/21/2023 ----------------------------------------------------------------------  OBSTETRICS REPORT                       (Signed Final 04/21/2023 11:30 am) ---------------------------------------------------------------------- Patient Info  ID #:       [id number]                          D.O.B.:  19-Jun-1991 (32 yrs)  Name:       [name]                  Visit Date: 04/21/2023 08:39 am ---------------------------------------------------------------------- Performed By  Attending:        Ma Rings MD         Ref. Address:     [address]                                                             [address]                                                             [place]                                                             [address]  Performed By:     Marcellina Millin       [facility]  Referred By:      [facility] ---------------------------------------------------------------------- Orders  #  Description                           Code        Ordered By  1  [place] MFM OB FOLLOW UP                   76816.01    YU FANG  2  [place] MFM UA CORD DOPPLER                76820.02    YU FANG  3  [place] MFM FETAL BPP                      76818.5     YU FANG     W/NONSTRESS  4  [place] MFM OB FOLLOW UP ADDL              78295.62    YU FANG     GEST  5  [place] MFM UA ADDL GEST                   76820.01    YU FANG  6  [place] MFM FETAL BPP                      13086.5     YU FANG     W/NONSTRESS ADD'L GEST ----------------------------------------------------------------------  #  Order #                     Accession #                Episode #  1  [id number]                   [id number]                 [id number]  2  [id number]                   [id number]                 [id number]  3  [id number]                   [id number]                 [id number]  4  [id number]                   [id number]                 [id number]  5  [id number]  [id number]                 [id number]  6  [id number]                   [id number]                 [id number] ---------------------------------------------------------------------- Indications  Maternal care for known or suspected poor      O36.5931  fetal growth, third trimester, fetus 1 IUGR  Twin pregnancy, di/di, third trimester         O30.043  Polyhydramnios, third trimester, antepartum    O40.3XX1  condition or complication, fetus 1  Gestational diabetes in pregnancy, diet        O24.410  controlled  Pyelectasis of fetus on prenatal ultrasound    O28.3  (Fetus A- resolved)  Fetal or maternal indication (Anti-Lewis A     O35.8XX0  Antibodies)  [redacted] weeks gestation of pregnancy                Z3A.32  Low risk NIPS (female/female) ---------------------------------------------------------------------- Vital Signs  BP:          122/78  ---------------------------------------------------------------------- Fetal Evaluation (Fetus A)  Num Of Fetuses:         2  Fetal Heart Rate(bpm):  145  Cardiac Activity:       Observed  Fetal Lie:              Maternal right side  Presentation:           Cephalic  Placenta:               Anterior  P. Cord Insertion:      Previously seen  Membrane Desc:      Dividing Membrane seen - Dichorionic.  Amniotic Fluid  AFI FV:      Polyhydramnios                              Largest Pocket(cm)                              9.7 ---------------------------------------------------------------------- Biophysical Evaluation (Fetus A)  Amniotic F.V:   Pocket => 2 cm             F. Tone:        Observed  F. Movement:    Observed                   N.S.T:          Reactive  F. Breathing:   Observed                   Score:          10/10 ---------------------------------------------------------------------- Biometry (Fetus A)  BPD:      76.1  mm     G. Age:  30w 4d          4  %    CI:        78.69   %    70 - 86  FL/HC:      20.2   %    19.1 - 21.3  HC:      271.3  mm     G. Age:  29w 4d        < 1  %    HC/AC:      1.01        0.96 - 1.17  AC:      267.6  mm     G. Age:  30w 6d         11  %    FL/BPD:     72.1   %    71 - 87  FL:       54.9  mm     G. Age:  29w 0d        < 1  %    FL/AC:      20.5   %    20 - 24  Est. FW:    1514  gm      3 lb 5 oz    2.1  %     FW Discordancy        21  % ---------------------------------------------------------------------- OB History  Blood Type:   A+  Gravidity:    7         Term:   2         SAB:   4  Living:       2 ---------------------------------------------------------------------- Gestational Age (Fetus A)  LMP:           32w 3d        Date:  09/06/22                  EDD:   06/13/23  U/S Today:     30w 0d                                        EDD:   06/30/23  Best:          32w 3d     Det. By:  LMP  (09/06/22)          EDD:    06/13/23 ---------------------------------------------------------------------- Targeted Anatomy (Fetus A)  Central Nervous System  Calvarium/Cranial V.:  Appears normal         Cereb./Vermis:          Previously seen  Cavum:                 Appears normal         Cisterna Magna:         Previously seen  Lateral Ventricles:    Previously seen        Midline Falx:           Previously seen  Choroid Plexus:        Previously seen  Spine  Cervical:              Previously seen        Sacral:                 Previously seen  Thoracic:              Previously seen        Shape/Curvature:        Previously seen  Lumbar:  Previously seen  Head/Neck  Lips:                  Previously seen        Profile:                Previously seen  Neck:                  Previously seen        Orbits/Eyes:            Previously seen  Nuchal Fold:           Previously seen        Mandible:               Previously seen  Nasal Bone:            Present                Maxilla:                Previously seen  Palate:                Previously seen  Thorax  4 Chamber View:        Previously seen        Interventr. Septum:     Previously seen  Cardiac Rhythm:        Normal                 Cardiac Axis:           Previously een  Cardiac Situs:         Previously seen        Diaphragm:              Appears normal  Rt Outflow Tract:      Previously seen        3 Vessel View:          Previously seen  Lt Outflow Tract:      Previously seen        3 V Trachea View:       Previously seen  Aortic Arch:           Previously seen        IVC:                    Previously seen  Ductal Arch:           Previously seen        Crossing:               Previously seen  SVC:                   Previously seen  Abdomen  Ventral Wall:          Previously seen        Lt Kidney:              Appears normal  Cord Insertion:        Previously seen        Rt Kidney:              Appears normal  Situs:                 Previously seen        Bladder:                 Appears normal  Stomach:  Appears normal  Extremities  Lt Humerus:            Previously seen        Lt Femur:               Previously seen  Rt Humerus:            Previously seen        Rt Femur:               Previously seen  Lt Forearm:            Previously seen        Lt Lower Leg:           Previously seen  Rt Forearm:            Previously seen        Rt Lower Leg:           Previously seen  Lt Hand:               Previously seen        Lt Foot:                Previously seen  Rt Hand:               Previously seen        Rt Foot:                Previously seen  Other  Umbilical Cord:        Previously seen        Genitalia:              Female-nml prev ---------------------------------------------------------------------- Doppler - Fetal Vessels (Fetus A)  Umbilical Artery   S/D     %tile      RI    %tile      PI    %tile     PSV    ADFV    RDFV                                                     (cm/s)   3.01       71    0.67       76       1       71    51.58      No      No ---------------------------------------------------------------------- Fetal Evaluation (Fetus B)  Num Of Fetuses:         2  Fetal Heart Rate(bpm):  138  Cardiac Activity:       Observed  Fetal Lie:              Maternal left side  Placenta:               Posterior  P. Cord Insertion:      Previously seen  Membrane Desc:      Dividing Membrane seen - Dichorionic.  Amniotic Fluid  AFI FV:      Within normal limits                              Largest Pocket(cm)  4.32 ---------------------------------------------------------------------- Biophysical Evaluation (Fetus B)  Amniotic F.V:   Pocket => 2 cm             F. Tone:        Observed  F. Movement:    Observed                   N.S.T:          Reactive  F. Breathing:   Observed                   Score:          10/10 ---------------------------------------------------------------------- Biometry (Fetus B)  BPD:      82.8  mm     G.  Age:  33w 2d         69  %    CI:        75.84   %    70 - 86                                                          FL/HC:      19.8   %    19.1 - 21.3  HC:      301.4  mm     G. Age:  33w 3d         39  %    HC/AC:      1.07        0.96 - 1.17  AC:      282.9  mm     G. Age:  32w 2d         46  %    FL/BPD:     72.2   %    71 - 87  FL:       59.8  mm     G. Age:  31w 1d         10  %    FL/AC:      21.1   %    20 - 24  Est. FW:    1916  gm      4 lb 4 oz     31  %     FW Discordancy     0 \ 21 % ---------------------------------------------------------------------- Gestational Age (Fetus B)  LMP:           32w 3d        Date:  09/06/22                  EDD:   06/13/23  U/S Today:     32w 4d                                        EDD:   06/12/23  Best:          32w 3d     Det. By:  LMP  (09/06/22)          EDD:   06/13/23 ---------------------------------------------------------------------- Targeted Anatomy (Fetus B)  Central Nervous System  Calvarium/Cranial V.:  Appears normal         Cereb./Vermis:          Previously seen  Cavum:  Appears normal         Cisterna Magna:         Previously seen  Lateral Ventricles:    Previously seen        Midline Falx:           Previously seen  Choroid Plexus:        Previously seen  Spine  Cervical:              Previously seen        Sacral:                 Previously seen  Thoracic:              Previously seen        Shape/Curvature:        Previously seen  Lumbar:                Previously seen  Head/Neck  Lips:                  Previously seen        Profile:                Previously seen  Neck:                  Previously seen        Orbits/Eyes:            Previously seen  Nuchal Fold:           Previously seen        Mandible:               Previously seen  Nasal Bone:            Previously seen        Maxilla:                Previously seen  Palate:                Previously seen  Thorax  4 Chamber View:        Previously seen        Interventr.  Septum:     Previously seen  Cardiac Rhythm:        Normal                 Cardiac Axis:           Previously een  Cardiac Situs:         Previously seen        Diaphragm:              Appears normal  Rt Outflow Tract:      Previously seen        3 Vessel View:          Previously seen  Lt Outflow Tract:      Previously seen        3 V Trachea View:       Previously seen  Aortic Arch:           Previously seen        IVC:                    Previously seen  Ductal Arch:           Previously seen        Crossing:  Previously seen  SVC:                   Previously seen  Abdomen  Ventral Wall:          Previously seen        Lt Kidney:              Appears normal  Cord Insertion:        Previously seen        Rt Kidney:              Appears normal  Situs:                 Previously seen        Bladder:                Appears normal  Stomach:               Appears normal  Extremities  Lt Humerus:            Previously seen        Lt Femur:               Previously seen  Rt Humerus:            Previously seen        Rt Femur:               Previously seen  Lt Forearm:            Previously seen        Lt Lower Leg:           Previously seen  Rt Forearm:            Previously seen        Rt Lower Leg:           Previously seen  Lt Hand:               Previously seen        Lt Foot:                Previously seen  Rt Hand:               Previously seen        Rt Foot:                Previously seen  Other  Umbilical Cord:        Previously seen        Genitalia:              Female-nml prev ---------------------------------------------------------------------- Doppler - Fetal Vessels (Fetus B)  Umbilical Artery   S/D     %tile      RI    %tile      PI    %tile     PSV    ADFV    RDFV                                                     (cm/s)   2.63       49    0.62       56     0.9       52     48.5      No  No ---------------------------------------------------------------------- Comments  This patient was  seen due to IUGR of twin A in a dichorionic,  diamniotic twin gestation.  She denies any problems since  her last exam and reports feeling vigorous fetal movements  throughout the day.  Twin A: EFW 3 pounds 5 ounces (2nd percentile for her  gestational age).  Polyhydramnios with a maximal vertical  pocket of 9.7 cm.  Doppler studies of the umbilical arteries  showed continued normal forward flow.  There were no signs  of absent or reversed end-diastolic flow noted.  BPP 10 out of  10 with a reactive NST.  Twin B: EFW 4 pounds 4 ounces (31st percentile for her  gestational age).  Normal amniotic fluid.  Doppler studies of  the umbilical arteries showed continued normal forward flow.  There were no signs of absent or reversed end-diastolic flow  noted.  BPP 10 out of 10 with a reactive NST.  Due to IUGR of twin A, she will return in 1 week for another  BPP/NST and umbilical artery Doppler study.  Due to IUGR of twin A in a dichorionic twin gestation, delivery  is recommended at around 36 weeks. ----------------------------------------------------------------------                   Ma Rings, MD Electronically Signed Final Report   04/21/2023 11:30 am ----------------------------------------------------------------------   [place] MFM OB LIMITED  Result Date: 04/15/2023 ----------------------------------------------------------------------  OBSTETRICS REPORT                       (Signed Final 04/15/2023 11:41 am) ---------------------------------------------------------------------- Patient Info  ID #:       [id number]                          D.O.B.:  1991/04/08 (32 yrs)  Name:       [name]                  Visit Date: 04/15/2023 08:26 am ---------------------------------------------------------------------- Performed By  Attending:        Noralee Space MD        Ref. Address:     [address]                                                              [address]                                                             [place]  [address]  Performed By:     Marcellina Millin       [facility]  Referred By:      [facility] ---------------------------------------------------------------------- Orders  #  Description                           Code        Ordered By  1  [place] MFM OB LIMITED                     60454.09    LISA Jean Rosenthal-                                                       Christell Constant  2  [place] MFM UA CORD DOPPLER                76820.02    LISA [name]                                                       MOORE  3  [place] MFM UA ADDL GEST                   81191.47    Antionette Char ----------------------------------------------------------------------  #  Order #                     Accession #                Episode #  1  [id number]                   [id number]  [id number]  2  [id number]                   [id number]                 [id number]  3  [id number]                   [id number]                 [id number] ---------------------------------------------------------------------- Indications  Maternal care for known or suspected poor      O36.5931  fetal growth, third trimester, fetus 1 IUGR  Polyhydramnios, third trimester, antepartum    O40.3XX1  condition or complication, fetus 1  Gestational diabetes in pregnancy, diet        O24.410  controlled  Twin pregnancy, di/di, third trimester         O30.043  Pyelectasis of fetus on prenatal ultrasound    O28.3  (Fetus A- resolved)  Fetal or maternal indication (Anti-Lewis A     O35.8XX0  Antibodies)  [redacted] weeks gestation of pregnancy                 Z3A.31  Low risk NIPS (female/female) ---------------------------------------------------------------------- Vital Signs  BP:          115/79 ---------------------------------------------------------------------- Fetal Evaluation (Fetus A)  Num Of Fetuses:         2  Fetal Heart Rate(bpm):  140  Cardiac Activity:       Observed  Fetal Lie:              Maternal right side  Presentation:           Cephalic  Placenta:               Anterior  P. Cord Insertion:      Previously seen  Membrane Desc:      Dividing Membrane seen - Dichorionic.  Amniotic Fluid  AFI FV:      Polyhydramnios                              Largest Pocket(cm)                              10.78 ---------------------------------------------------------------------- OB History  Blood Type:   A+  Gravidity:    7         Term:   2         SAB:   4  Living:       2 ---------------------------------------------------------------------- Gestational Age (Fetus A)  LMP:           31w 4d        Date:  09/06/22                  EDD:   06/13/23  Best:          31w 4d     Det. By:  LMP  (09/06/22)          EDD:   06/13/23 ---------------------------------------------------------------------- Doppler - Fetal Vessels (Fetus A)  Umbilical Artery   S/D     %tile      RI    %tile      PI    %tile     PSV    ADFV    RDFV                                                     (  cm/s)   2.41       31    0.59       36    0.83       31    52.42      No      No ---------------------------------------------------------------------- Fetal Evaluation (Fetus B)  Num Of Fetuses:         2  Fetal Heart Rate(bpm):  145  Cardiac Activity:       Observed  Fetal Lie:              Maternal left side  Presentation:           Cephalic  Placenta:               Posterior  P. Cord Insertion:      Previously seen  Membrane Desc:      Dividing Membrane seen - Dichorionic.  Amniotic Fluid  AFI FV:      Within normal limits                              Largest Pocket(cm)                               6.24 ---------------------------------------------------------------------- Gestational Age (Fetus B)  LMP:           31w 4d        Date:  09/06/22                  EDD:   06/13/23  Best:          31w 4d     Det. By:  LMP  (09/06/22)          EDD:   06/13/23 ---------------------------------------------------------------------- Doppler - Fetal Vessels (Fetus B)  Umbilical Artery   S/D     %tile      RI    %tile      PI    %tile     PSV    ADFV    RDFV                                                     (cm/s)   2.66       47    0.62       51    0.93       53    49.82      No      No ---------------------------------------------------------------------- Impression  Dichorionic-diamniotic twin pregnancy with selective fetal  growth restriction in twin A.  On ultrasound performed 2  weeks ago, the estimated fetal weight of twin A was at the 1st  percentile.  Patient has gestational diabetes that is well-controlled on diet.  Twin A: Maternal right, cephalic presentation, anterior  placenta.  Polyhydramnios is seen (MVP 11 cm).  Good fetal  activity is present.  Umbilical artery Doppler showed normal  forward diastolic flow.  NST is reactive.  Twin B: Maternal left, cephalic presentation, posterior  placenta.  Amniotic fluid is normal and good fetal activity  seen.  Umbilical artery Doppler showed normal forward  diastolic flow.  NST is reactive. ---------------------------------------------------------------------- Recommendations  - Continue weekly antenatal testing till delivery. ----------------------------------------------------------------------  Noralee Space, MD Electronically Signed Final Report   04/15/2023 11:41 am ----------------------------------------------------------------------   [place] MFM UA CORD DOPPLER  Result Date: 04/15/2023 ----------------------------------------------------------------------  OBSTETRICS REPORT                       (Signed Final 04/15/2023 11:41 am)  ---------------------------------------------------------------------- Patient Info  ID #:       [id number]                          D.O.B.:  Dec 25, 1990 (32 yrs)  Name:       [name]                  Visit Date: 04/15/2023 08:26 am ---------------------------------------------------------------------- Performed By  Attending:        Noralee Space MD        Ref. Address:     [address]                                                             [address]                                                             [place]                                                             [address]  Performed By:     Marcellina Millin       [facility]  Referred By:      [facility] ---------------------------------------------------------------------- Orders  #  Description                           Code        Ordered By  1  [place] MFM OB LIMITED                     [address].60    LISA Jean Rosenthal-                                                       Christell Constant  2  [place] MFM UA CORD DOPPLER                768[address].02    LISA [address]-                                                       MOORE  3  [place] MFM UA ADDL GEST                   [id number]    Antionette Char ----------------------------------------------------------------------  #  Order #                     Accession #                Episode #  1  [id number]                   [id number]                 [id number]  2  [id number]                   [id number]                 [id number]  3  [id number]                   [id number]                 [id number] ----------------------------------------------------------------------  Indications  Maternal care for known or suspected poor      O36.5931  fetal growth, third trimester, fetus 1 IUGR  Polyhydramnios, third trimester, antepartum    O40.3XX1  condition or complication, fetus 1  Gestational diabetes in pregnancy, diet        O24.410  controlled  Twin pregnancy, di/di, third trimester         O30.043  Pyelectasis of fetus on prenatal ultrasound    O28.3  (Fetus A- resolved)  Fetal or maternal indication (Anti-Lewis A     O35.8XX0  Antibodies)  [redacted] weeks gestation of pregnancy                Z3A.31  Low risk NIPS (female/female) ---------------------------------------------------------------------- Vital Signs  BP:          115/79 ---------------------------------------------------------------------- Fetal Evaluation (Fetus A)  Num Of Fetuses:         2  Fetal Heart Rate(bpm):  140  Cardiac Activity:       Observed  Fetal Lie:              Maternal right side  Presentation:           Cephalic  Placenta:               Anterior  P. Cord Insertion:      Previously seen  Membrane Desc:      Dividing Membrane seen - Dichorionic.  Amniotic Fluid  AFI FV:      Polyhydramnios                              Largest Pocket(cm)                              10.78 ---------------------------------------------------------------------- OB History  Blood Type:   A+  Gravidity:    7         Term:   2         SAB:   4  Living:       2 ---------------------------------------------------------------------- Gestational Age (Fetus A)  LMP:           31w 4d        Date:  09/06/22                  EDD:   06/13/23  Best:          31w 4d     Det. By:  LMP  (09/06/22)          EDD:   06/13/23 ---------------------------------------------------------------------- Doppler - Fetal Vessels (Fetus A)  Umbilical Artery   S/D     %tile      RI    %tile      PI    %tile     PSV    ADFV    RDFV                                                     (cm/s)   2.41       31    0.59       36    0.83       31    52.42      No      No  ---------------------------------------------------------------------- Fetal Evaluation (Fetus B)  Num Of Fetuses:         2  Fetal Heart Rate(bpm):  145  Cardiac Activity:       Observed  Fetal Lie:              Maternal left side  Presentation:           Cephalic  Placenta:               Posterior  P. Cord Insertion:      Previously seen  Membrane Desc:      Dividing Membrane seen - Dichorionic.  Amniotic Fluid  AFI FV:  Within normal limits                              Largest Pocket(cm)                              6.24 ---------------------------------------------------------------------- Gestational Age (Fetus B)  LMP:           31w 4d        Date:  09/06/22                  EDD:   06/13/23  Best:          31w 4d     Det. By:  LMP  (09/06/22)          EDD:   06/13/23 ---------------------------------------------------------------------- Doppler - Fetal Vessels (Fetus B)  Umbilical Artery   S/D     %tile      RI    %tile      PI    %tile     PSV    ADFV    RDFV                                                     (cm/s)   2.66       47    0.62       51    0.93       53    49.82      No      No ---------------------------------------------------------------------- Impression  Dichorionic-diamniotic twin pregnancy with selective fetal  growth restriction in twin A.  On ultrasound performed 2  weeks ago, the estimated fetal weight of twin A was at the 1st  percentile.  Patient has gestational diabetes that is well-controlled on diet.  Twin A: Maternal right, cephalic presentation, anterior  placenta.  Polyhydramnios is seen (MVP 11 cm).  Good fetal  activity is present.  Umbilical artery Doppler showed normal  forward diastolic flow.  NST is reactive.  Twin B: Maternal left, cephalic presentation, posterior  placenta.  Amniotic fluid is normal and good fetal activity  seen.  Umbilical artery Doppler showed normal forward  diastolic flow.  NST is reactive.  ---------------------------------------------------------------------- Recommendations  - Continue weekly antenatal testing till delivery. ----------------------------------------------------------------------                  Noralee Space, MD Electronically Signed Final Report   04/15/2023 11:41 am ----------------------------------------------------------------------   [place] MFM UA ADDL GEST  Result Date: 04/15/2023 ----------------------------------------------------------------------  OBSTETRICS REPORT                       (Signed Final 04/15/2023 11:41 am) ---------------------------------------------------------------------- Patient Info  ID #:       [id number]                          D.O.B.:  01/21/1991 (32 yrs)  Name:       [name]                  Visit Date: 04/15/2023 08:26 am ---------------------------------------------------------------------- Performed By  Attending:        Noralee Space MD  Ref. Address:     [address]                                                             [address]                                                             [place]                                                             [address]  Performed By:     Marcellina Millin       [facility]  Referred By:      [facility] ---------------------------------------------------------------------- Orders  #  Description                           Code        Ordered By  1  [place] MFM OB LIMITED                     86578.46    Ilene Qua-  MOORE  2  [place] MFM UA CORD DOPPLER                76820.02    LISA [address]-                                                        MOORE  3  [place] MFM UA ADDL GEST                   09811.91    Antionette Char ----------------------------------------------------------------------  #  Order #                     Accession #                Episode #  1  [id number]                   [id number]                 [id number]  2  [id number]                   [id number]                 [id number]  3  [id number]                   [id number]                 [id number] ---------------------------------------------------------------------- Indications  Maternal care for known or suspected poor      O36.5931  fetal growth, third trimester, fetus 1 IUGR  Polyhydramnios, third trimester, antepartum    O40.3XX1  condition or complication, fetus 1  Gestational diabetes in pregnancy, diet        O24.410  controlled  Twin pregnancy, di/di, third trimester         O30.043  Pyelectasis of fetus on prenatal ultrasound    O28.3  (Fetus A- resolved)  Fetal or maternal indication (Anti-Lewis A     O35.8XX0  Antibodies)  [redacted] weeks gestation of pregnancy                Z3A.31  Low risk NIPS (female/female) ---------------------------------------------------------------------- Vital Signs  BP:          115/79 ---------------------------------------------------------------------- Fetal Evaluation (Fetus A)  Num Of Fetuses:         2  Fetal Heart Rate(bpm):  140  Cardiac Activity:       Observed  Fetal Lie:              Maternal right side  Presentation:           Cephalic  Placenta:               Anterior  P. Cord Insertion:      Previously seen  Membrane Desc:      Dividing Membrane seen - Dichorionic.  Amniotic Fluid  AFI FV:      Polyhydramnios  Largest Pocket(cm)                              10.78 ---------------------------------------------------------------------- OB History  Blood Type:   A+  Gravidity:    7         Term:   2         SAB:   4  Living:       2  ---------------------------------------------------------------------- Gestational Age (Fetus A)  LMP:           31w 4d        Date:  09/06/22                  EDD:   06/13/23  Best:          31w 4d     Det. By:  LMP  (09/06/22)          EDD:   06/13/23 ---------------------------------------------------------------------- Doppler - Fetal Vessels (Fetus A)  Umbilical Artery   S/D     %tile      RI    %tile      PI    %tile     PSV    ADFV    RDFV                                                     (cm/s)   2.41       31    0.59       36    0.83       31    52.42      No      No ---------------------------------------------------------------------- Fetal Evaluation (Fetus B)  Num Of Fetuses:         2  Fetal Heart Rate(bpm):  145  Cardiac Activity:       Observed  Fetal Lie:              Maternal left side  Presentation:           Cephalic  Placenta:               Posterior  P. Cord Insertion:      Previously seen  Membrane Desc:      Dividing Membrane seen - Dichorionic.  Amniotic Fluid  AFI FV:      Within normal limits                              Largest Pocket(cm)                              6.24 ---------------------------------------------------------------------- Gestational Age (Fetus B)  LMP:           31w 4d        Date:  09/06/22                  EDD:   06/13/23  Best:          31w 4d     Det. By:  LMP  (09/06/22)          EDD:   06/13/23 ---------------------------------------------------------------------- Doppler - Fetal Vessels (Fetus B)  Umbilical Artery   S/D     %  tile      RI    %tile      PI    %tile     PSV    ADFV    RDFV                                                     (cm/s)   2.66       47    0.62       51    0.93       53    49.82      No      No ---------------------------------------------------------------------- Impression  Dichorionic-diamniotic twin pregnancy with selective fetal  growth restriction in twin A.  On ultrasound performed 2  weeks ago, the estimated fetal weight of twin  A was at the 1st  percentile.  Patient has gestational diabetes that is well-controlled on diet.  Twin A: Maternal right, cephalic presentation, anterior  placenta.  Polyhydramnios is seen (MVP 11 cm).  Good fetal  activity is present.  Umbilical artery Doppler showed normal  forward diastolic flow.  NST is reactive.  Twin B: Maternal left, cephalic presentation, posterior  placenta.  Amniotic fluid is normal and good fetal activity  seen.  Umbilical artery Doppler showed normal forward  diastolic flow.  NST is reactive. ---------------------------------------------------------------------- Recommendations  - Continue weekly antenatal testing till delivery. ----------------------------------------------------------------------                  Noralee Space, MD Electronically Signed Final Report   04/15/2023 11:41 am ----------------------------------------------------------------------   [place] MFM UA CORD DOPPLER  Result Date: 04/08/2023 ----------------------------------------------------------------------  OBSTETRICS REPORT                       (Signed Final 04/08/2023 11:51 am) ---------------------------------------------------------------------- Patient Info  ID #:       [id number]                          D.O.B.:  [birth year] (32 yrs)  Name:       [name]                  Visit Date: 04/08/2023 08:34 am ---------------------------------------------------------------------- Performed By  Attending:        Ma Rings MD         Ref. Address:     [address]                                                             [phone]  [place]                                                             [address]  Performed By:     Alain Marion     Location[facility]  Referred By:      [facility] ---------------------------------------------------------------------- Orders  #  Description                           Code        Ordered By  1  [place] MFM OB LIMITED                     60454.09    LISA Jean Rosenthal-                                                       Christell Constant  2  [place] MFM UA CORD DOPPLER                76820.02    LISA [address]-                                                       MOORE  3  [place] MFM UA DOPPLER ADDL                81191.47    LISA JACKSON-     GEST RE EVAL                                      MOORE ----------------------------------------------------------------------  #  Order #                     Accession #  Episode #  1  [id number]                   [id number]                 [id number]  2  [id number]                   [id number]                 [id number]  3  [id number]                   [id number]                 [id number] ---------------------------------------------------------------------- Indications  Maternal care for known or suspected poor      O36.5931  fetal growth, third trimester, fetus 1 IUGR  Gestational diabetes in pregnancy, diet        O24.410  controlled  Twin pregnancy, di/di, third trimester         O38.043  Pyelectasis of fetus on prenatal ultrasound    O28.3  (Fetus A- resolved)  Fetal or maternal indication (Anti-Lewis A     O35.8XX0  Antibodies)  Encounter for other antenatal screening        Z36.2  follow-up  Low risk NIPS (female/female)  [redacted] weeks gestation of pregnancy                Z3A.30 ---------------------------------------------------------------------- Fetal Evaluation (Fetus A)  Num Of Fetuses:         2  Fetal Heart Rate(bpm):  148  Cardiac Activity:       Observed  Fetal Lie:              Maternal right side  Presentation:           Cephalic  Placenta:               Anterior  P. Cord Insertion:       Previously seen  Membrane Desc:      Dividing Membrane seen - Dichorionic.  Amniotic Fluid  AFI FV:      Within normal limits                              Largest Pocket(cm)                              5.73 ---------------------------------------------------------------------- OB History  Blood Type:   A+  Gravidity:    7         Term:   2         SAB:   4  Living:       2 ---------------------------------------------------------------------- Gestational Age (Fetus A)  LMP:           30w 4d        Date:  09/06/22                  EDD:   06/13/23  Best:          Georgiann Hahn 4d     Det. By:  LMP  (09/06/22)          EDD:   06/13/23 ---------------------------------------------------------------------- Anatomy (Fetus A)  Stomach:               Appears normal, left   Bladder:  Appears normal                         sided ---------------------------------------------------------------------- Doppler - Fetal Vessels (Fetus A)  Umbilical Artery   S/D     %tile      RI    %tile      PI    %tile     PSV    ADFV    RDFV                                                     (cm/s)   2.79       50    0.64       53    0.96       54    40.06      No      No ---------------------------------------------------------------------- Fetal Evaluation (Fetus B)  Num Of Fetuses:         2  Fetal Heart Rate(bpm):  140  Cardiac Activity:       Observed  Fetal Lie:              Maternal left side  Placenta:               Posterior  P. Cord Insertion:      Previously seen  Membrane Desc:      Dividing Membrane seen - Dichorionic.  Amniotic Fluid  AFI FV:      Within normal limits                              Largest Pocket(cm)                              4.04 ---------------------------------------------------------------------- Gestational Age (Fetus B)  LMP:           30w 4d        Date:  09/06/22                  EDD:   06/13/23  Best:          Georgiann Hahn 4d     Det. By:  LMP  (09/06/22)          EDD:   06/13/23  ---------------------------------------------------------------------- Anatomy (Fetus B)  Stomach:               Appears normal, left   Bladder:                Appears normal                         sided ---------------------------------------------------------------------- Doppler - Fetal Vessels (Fetus B)  Umbilical Artery   S/D     %tile      RI    %tile      PI    %tile     PSV    ADFV    RDFV                                                     (  cm/s)   3.19       72    0.69       77    1.04       70    48.23      No      No ---------------------------------------------------------------------- Comments  This patient was seen due to IUGR of twin A in a dichorionic,  diamniotic twin gestation.  She denies any problems since  her last exam.  She reports feeling fetal movements of both  fetuses throughout the day.  There was normal amniotic fluid noted on today's ultrasound  exam around both twin A and twin B.  Doppler studies of the umbilical arteries performed due to  fetal growth restriction showed a normal S/D ratio for both  twin A and twin B.  There were no signs of absent or reversed  end-diastolic flow noted today in either fetus.  She had a reactive NST for both twin A and twin B today.  A follow-up exam was scheduled in 1 week. ----------------------------------------------------------------------                   Ma Rings, MD Electronically Signed Final Report   04/08/2023 11:51 am ----------------------------------------------------------------------   [place] MFM OB LIMITED  Result Date: 04/08/2023 ----------------------------------------------------------------------  OBSTETRICS REPORT                       (Signed Final 04/08/2023 11:51 am) ---------------------------------------------------------------------- Patient Info  ID #:       [id number]                          D.O.B.:  Jul 23, 1990 (32 yrs)  Name:       [name]                  Visit Date: 04/08/2023 08:34 am  ---------------------------------------------------------------------- Performed By  Attending:        Ma Rings MD         Ref. Address:     [address]                                                             [address]                                                             [place]  [address]  Performed By:     Alain Marion     [facility]  Referred By:      [facility] ---------------------------------------------------------------------- Orders  #  Description                           Code        Ordered By  1  [place] MFM OB LIMITED                     56213.08    LISA Jean Rosenthal-                                                       Christell Constant  2  [place] MFM UA CORD DOPPLER                76820.02    [address]ISA [address]  3  [place] MFM UA DOPPLER ADDL                65784.69    LISA Jean Rosenthal-     GEST RE EVAL                                      MOORE ----------------------------------------------------------------------  #  Order #                     Accession #                Episode #  1  [id number]                   [id number]                 [id number]  2  [id number]  [id number]                 [id number]  3  [id number]                   [id number]                 [id number] ---------------------------------------------------------------------- Indications  Maternal care for known or suspected poor      O36.5931  fetal growth, third trimester, fetus 1 IUGR  Gestational diabetes in pregnancy, diet        O24.410  controlled  Twin pregnancy, di/di, third trimester         O30.043   Pyelectasis of fetus on prenatal ultrasound    O28.3  (Fetus A- resolved)  Fetal or maternal indication (Anti-Lewis A     O35.8XX0  Antibodies)  Encounter for other antenatal screening        Z36.2  follow-up  Low risk NIPS (female/female)  [redacted] weeks gestation of pregnancy                Z3A.30 ---------------------------------------------------------------------- Fetal Evaluation (Fetus A)  Num Of Fetuses:         2  Fetal Heart Rate(bpm):  148  Cardiac Activity:       Observed  Fetal Lie:              Maternal right side  Presentation:           Cephalic  Placenta:               Anterior  P. Cord Insertion:      Previously seen  Membrane Desc:      Dividing Membrane seen - Dichorionic.  Amniotic Fluid  AFI FV:      Within normal limits                              Largest Pocket(cm)                              5.73 ---------------------------------------------------------------------- OB History  Blood Type:   A+  Gravidity:    7         Term:   2         SAB:   4  Living:       2 ---------------------------------------------------------------------- Gestational Age (Fetus A)  LMP:           30w 4d        Date:  09/06/22                  EDD:   06/13/23  Best:          Georgiann Hahn 4d     Det. By:  LMP  (09/06/22)          EDD:   06/13/23 ---------------------------------------------------------------------- Anatomy (Fetus A)  Stomach:               Appears normal, left   Bladder:                Appears normal                         sided ---------------------------------------------------------------------- Doppler - Fetal Vessels (Fetus A)  Umbilical Artery   S/D     %tile      RI    %tile  PI    %tile     PSV    ADFV    RDFV                                                     (cm/s)   2.79       50    0.64       53    0.96       54    40.06      No      No ---------------------------------------------------------------------- Fetal Evaluation (Fetus B)  Num Of Fetuses:         2  Fetal Heart Rate(bpm):  140  Cardiac  Activity:       Observed  Fetal Lie:              Maternal left side  Placenta:               Posterior  P. Cord Insertion:      Previously seen  Membrane Desc:      Dividing Membrane seen - Dichorionic.  Amniotic Fluid  AFI FV:      Within normal limits                              Largest Pocket(cm)                              4.04 ---------------------------------------------------------------------- Gestational Age (Fetus B)  LMP:           30w 4d        Date:  09/06/22                  EDD:   06/13/23  Best:          Georgiann Hahn 4d     Det. By:  LMP  (09/06/22)          EDD:   06/13/23 ---------------------------------------------------------------------- Anatomy (Fetus B)  Stomach:               Appears normal, left   Bladder:                Appears normal                         sided ---------------------------------------------------------------------- Doppler - Fetal Vessels (Fetus B)  Umbilical Artery   S/D     %tile      RI    %tile      PI    %tile     PSV    ADFV    RDFV                                                     (cm/s)   3.19       72    0.69       77    1.04       70    48.23      No      No ---------------------------------------------------------------------- Comments  This patient  was seen due to IUGR of twin A in a dichorionic,  diamniotic twin gestation.  She denies any problems since  her last exam.  She reports feeling fetal movements of both  fetuses throughout the day.  There was normal amniotic fluid noted on today's ultrasound  exam around both twin A and twin B.  Doppler studies of the umbilical arteries performed due to  fetal growth restriction showed a normal S/D ratio for both  twin A and twin B.  There were no signs of absent or reversed  end-diastolic flow noted today in either fetus.  She had a reactive NST for both twin A and twin B today.  A follow-up exam was scheduled in 1 week. ----------------------------------------------------------------------                   Ma Rings, MD Electronically Signed Final Report   04/08/2023 11:51 am ----------------------------------------------------------------------   [place] MFM UA DOPPLER ADDL GEST RE EVAL  Result Date: 04/08/2023 ----------------------------------------------------------------------  OBSTETRICS REPORT                       (Signed Final 04/08/2023 11:51 am) ---------------------------------------------------------------------- Patient Info  ID #:       [id number]                          D.O.B.:  May 14, 1991 (32 yrs)  Name:       [name]                  Visit Date: 04/08/2023 08:34 am ---------------------------------------------------------------------- Performed By  Attending:        Ma Rings MD         Ref. Address:     [address]                                                             [address]                                                             [place]                                                             [address]  Performed By:     Alain Marion     [facility]  Referred By:      [facility] ---------------------------------------------------------------------- Orders  #  Description                           Code        Ordered By  1  [place] MFM OB LIMITED                     44034.74    LISA Jean Rosenthal-                                                       Christell Constant  2  [place] MFM UA CORD DOPPLER                76820.02    LISA [address]                                                       MOORE  3  [place] MFM UA DOPPLER ADDL                [phone]    Ilene Qua-     GEST RE EVAL                                      MOORE ----------------------------------------------------------------------  #   Order #                     Accession #                Episode #  1  [id number]                   [id number]                 [id number]  2  [id number]                   [id number]                 [id number]  3  [id number]  [id number]                 [id number] ---------------------------------------------------------------------- Indications  Maternal care for known or suspected poor      O36.5931  fetal growth, third trimester, fetus 1 IUGR  Gestational diabetes in pregnancy, diet        O24.410  controlled  Twin pregnancy, di/di, third trimester         O30.043  Pyelectasis of fetus on prenatal ultrasound    O28.3  (Fetus A- resolved)  Fetal or maternal indication (Anti-Lewis A     O35.8XX0  Antibodies)  Encounter for other antenatal screening        Z36.2  follow-up  Low risk NIPS (female/female)  [redacted] weeks gestation of pregnancy                Z3A.30 ---------------------------------------------------------------------- Fetal Evaluation (Fetus A)  Num Of Fetuses:         2  Fetal Heart Rate(bpm):  148  Cardiac Activity:       Observed  Fetal Lie:              Maternal right side  Presentation:           Cephalic  Placenta:               Anterior  P. Cord Insertion:      Previously seen  Membrane Desc:      Dividing Membrane seen - Dichorionic.  Amniotic Fluid  AFI FV:      Within normal limits                              Largest Pocket(cm)                              5.73 ---------------------------------------------------------------------- OB History  Blood Type:   A+  Gravidity:    7         Term:   2         SAB:   4  Living:       2 ---------------------------------------------------------------------- Gestational Age (Fetus A)  LMP:           30w 4d        Date:  09/06/22                  EDD:   06/13/23  Best:          Georgiann Hahn 4d     Det. By:  LMP  (09/06/22)          EDD:   06/13/23 ---------------------------------------------------------------------- Anatomy (Fetus A)  Stomach:                Appears normal, left   Bladder:                Appears normal                         sided ---------------------------------------------------------------------- Doppler - Fetal Vessels (Fetus A)  Umbilical Artery   S/D     %tile      RI    %tile      PI    %tile     PSV    ADFV    RDFV                                                     (  cm/s)   2.79       50    0.64       53    0.96       54    40.06      No      No ---------------------------------------------------------------------- Fetal Evaluation (Fetus B)  Num Of Fetuses:         2  Fetal Heart Rate(bpm):  140  Cardiac Activity:       Observed  Fetal Lie:              Maternal left side  Placenta:               Posterior  P. Cord Insertion:      Previously seen  Membrane Desc:      Dividing Membrane seen - Dichorionic.  Amniotic Fluid  AFI FV:      Within normal limits                              Largest Pocket(cm)                              4.04 ---------------------------------------------------------------------- Gestational Age (Fetus B)  LMP:           30w 4d        Date:  09/06/22                  EDD:   06/13/23  Best:          Georgiann Hahn 4d     Det. By:  LMP  (09/06/22)          EDD:   06/13/23 ---------------------------------------------------------------------- Anatomy (Fetus B)  Stomach:               Appears normal, left   Bladder:                Appears normal                         sided ---------------------------------------------------------------------- Doppler - Fetal Vessels (Fetus B)  Umbilical Artery   S/D     %tile      RI    %tile      PI    %tile     PSV    ADFV    RDFV                                                     (cm/s)   3.19       72    0.69       77    1.04       70    48.23      No      No ---------------------------------------------------------------------- Comments  This patient was seen due to IUGR of twin A in a dichorionic,  diamniotic twin gestation.  She denies any problems since  her last exam.  She reports  feeling fetal movements of both  fetuses throughout the day.  There was normal amniotic fluid noted on today's ultrasound  exam around both twin A and twin B.  Doppler studies of the umbilical arteries performed due to  fetal growth  restriction showed a normal S/D ratio for both  twin A and twin B.  There were no signs of absent or reversed  end-diastolic flow noted today in either fetus.  She had a reactive NST for both twin A and twin B today.  A follow-up exam was scheduled in 1 week. ----------------------------------------------------------------------                   Ma Rings, MD Electronically Signed Final Report   04/08/2023 11:51 am ----------------------------------------------------------------------    MAU Management/MDM: Orders Placed This Encounter  Procedures   Wet prep, genital   CBC   Comprehensive metabolic panel   Protein / creatinine ratio, urine   Encourage/Reinforce Importance Po Fluids   Discharge patient    Meds ordered this encounter  Medications   NIFEdipine (PROCARDIA) capsule 10 mg   cyclobenzaprine (FLEXERIL) tablet 10 mg   metroNIDAZOLE (FLAGYL) 500 MG tablet    Sig: Take 1 tablet (500 mg total) by mouth 2 (two) times daily.    Dispense:  14 tablet    Refill:  0   DISCONTD: NIFEdipine (PROCARDIA) 10 MG capsule    Sig: Take 1 capsule (10 mg total) by mouth every 4 (four) hours as needed (contractions).    Dispense:  30 capsule    Refill:  0   NIFEdipine (PROCARDIA-XL/NIFEDICAL-XL) 30 MG 24 hr tablet    Sig: Take 1 tablet (30 mg total) by mouth daily.    Dispense:  90 tablet    Refill:  1     Available prenatal records reviewed.  Patient presents for preterm contractions. Need to rule-out preterm labor. No s/sx of PPROM at this time. Reassuringly no VB, and normal fetal movements from both twins. While in MAU, had borderline elevated pressures upon arrival. Did spike one mild range pressure, so will additionally obtain preE rule-out.  1539: Patient  feels improved upon reassessment after flexeril and procardia x4. No ctx on toco, so do not feel need to recheck cervix at this time. Will additionally treat with flagyl for BV which is likely contributing to cramping/contractions. Ruled-out preterm labor.   However, reviewed Bps from office visit earlier today and while in MAU and preE labs. Rules-in for mild preE at this time with 2 mild range Bps 4 hours apart with P:C 0.5. Will start Procardia XL 30 mg every day as both UI and BP responded well to this. Has BP cuff at home and close follow-up. Reviewed how to take a blood pressure and s/sx of worsening preE. Delivery scheduled already at 36 weeks.  ASSESSMENT 1. Mild pre-eclampsia in third trimester   2. Preterm contractions   3. Bacterial vaginosis   4. Dichorionic diamniotic twin pregnancy in third trimester   5. [redacted] weeks gestation of pregnancy     PLAN Discharge home with strict return precautions. Allergies as of 05/06/2023       Reactions   Latex Rash   Sulfa Antibiotics Hives        Medication List     TAKE these medications    Accu-Chek Guide test strip Generic drug: glucose blood Use as instructed   Accu-Chek Guide w/Device Kit 1 kit by Does not apply route daily.   Accu-Chek Softclix Lancets lancets 100 each by Other route 4 (four) times daily.   aspirin 81 MG chewable tablet Chew 1 tablet (81 mg total) by mouth daily. Start at 15 weeks   metroNIDAZOLE 500 MG tablet Commonly known as: FLAGYL Take 1 tablet (500  mg total) by mouth 2 (two) times daily.   NIFEdipine 30 MG 24 hr tablet Commonly known as: PROCARDIA-XL/NIFEDICAL-XL Take 1 tablet (30 mg total) by mouth daily.   PRENATAL GUMMIES PO Take 2 tablets by mouth daily.         Wylene Simmer, MD OB Fellow 05/06/2023  3:39 PM

## 2023-05-09 ENCOUNTER — Telehealth (HOSPITAL_COMMUNITY): Payer: Self-pay | Admitting: *Deleted

## 2023-05-09 ENCOUNTER — Encounter (HOSPITAL_COMMUNITY): Payer: Self-pay

## 2023-05-09 NOTE — Telephone Encounter (Signed)
Preadmission screen  

## 2023-05-10 ENCOUNTER — Telehealth (HOSPITAL_COMMUNITY): Payer: Self-pay | Admitting: *Deleted

## 2023-05-10 ENCOUNTER — Encounter (HOSPITAL_COMMUNITY): Payer: Self-pay | Admitting: *Deleted

## 2023-05-10 ENCOUNTER — Other Ambulatory Visit: Payer: Self-pay | Admitting: Advanced Practice Midwife

## 2023-05-10 NOTE — Telephone Encounter (Signed)
Preadmission screen  

## 2023-05-11 ENCOUNTER — Ambulatory Visit: Payer: 59 | Admitting: Cardiology

## 2023-05-12 ENCOUNTER — Other Ambulatory Visit: Payer: Self-pay | Admitting: Maternal & Fetal Medicine

## 2023-05-12 ENCOUNTER — Other Ambulatory Visit: Payer: Self-pay

## 2023-05-12 ENCOUNTER — Ambulatory Visit (HOSPITAL_BASED_OUTPATIENT_CLINIC_OR_DEPARTMENT_OTHER): Payer: 59

## 2023-05-12 ENCOUNTER — Inpatient Hospital Stay (HOSPITAL_COMMUNITY)
Admission: AD | Admit: 2023-05-12 | Discharge: 2023-05-12 | Disposition: A | Discharge status: Home / Self Care | Payer: 59 | Source: Home / Self Care | Attending: Obstetrics and Gynecology | Admitting: Obstetrics and Gynecology

## 2023-05-12 ENCOUNTER — Inpatient Hospital Stay (HOSPITAL_COMMUNITY): Payer: 59

## 2023-05-12 ENCOUNTER — Ambulatory Visit (HOSPITAL_BASED_OUTPATIENT_CLINIC_OR_DEPARTMENT_OTHER): Payer: 59 | Admitting: *Deleted

## 2023-05-12 ENCOUNTER — Ambulatory Visit: Payer: 59 | Admitting: *Deleted

## 2023-05-12 ENCOUNTER — Encounter (HOSPITAL_COMMUNITY): Payer: Self-pay | Admitting: Obstetrics and Gynecology

## 2023-05-12 ENCOUNTER — Ambulatory Visit: Payer: 59

## 2023-05-12 ENCOUNTER — Ambulatory Visit (HOSPITAL_BASED_OUTPATIENT_CLINIC_OR_DEPARTMENT_OTHER): Payer: 59 | Admitting: Maternal & Fetal Medicine

## 2023-05-12 VITALS — BP 139/82 | HR 93

## 2023-05-12 DIAGNOSIS — Z3689 Encounter for other specified antenatal screening: Secondary | ICD-10-CM

## 2023-05-12 DIAGNOSIS — O365931 Maternal care for other known or suspected poor fetal growth, third trimester, fetus 1: Secondary | ICD-10-CM | POA: Insufficient documentation

## 2023-05-12 DIAGNOSIS — O403XX1 Polyhydramnios, third trimester, fetus 1: Secondary | ICD-10-CM | POA: Insufficient documentation

## 2023-05-12 DIAGNOSIS — O365990 Maternal care for other known or suspected poor fetal growth, unspecified trimester, not applicable or unspecified: Secondary | ICD-10-CM | POA: Diagnosis not present

## 2023-05-12 DIAGNOSIS — O24419 Gestational diabetes mellitus in pregnancy, unspecified control: Secondary | ICD-10-CM

## 2023-05-12 DIAGNOSIS — O358XX0 Maternal care for other (suspected) fetal abnormality and damage, not applicable or unspecified: Secondary | ICD-10-CM

## 2023-05-12 DIAGNOSIS — O26893 Other specified pregnancy related conditions, third trimester: Secondary | ICD-10-CM | POA: Insufficient documentation

## 2023-05-12 DIAGNOSIS — O30043 Twin pregnancy, dichorionic/diamniotic, third trimester: Secondary | ICD-10-CM

## 2023-05-12 DIAGNOSIS — O1403 Mild to moderate pre-eclampsia, third trimester: Secondary | ICD-10-CM | POA: Diagnosis not present

## 2023-05-12 DIAGNOSIS — O0993 Supervision of high risk pregnancy, unspecified, third trimester: Secondary | ICD-10-CM

## 2023-05-12 DIAGNOSIS — O368330 Maternal care for abnormalities of the fetal heart rate or rhythm, third trimester, not applicable or unspecified: Secondary | ICD-10-CM | POA: Insufficient documentation

## 2023-05-12 DIAGNOSIS — O163 Unspecified maternal hypertension, third trimester: Secondary | ICD-10-CM | POA: Insufficient documentation

## 2023-05-12 DIAGNOSIS — Z3A35 35 weeks gestation of pregnancy: Secondary | ICD-10-CM | POA: Insufficient documentation

## 2023-05-12 DIAGNOSIS — O24410 Gestational diabetes mellitus in pregnancy, diet controlled: Secondary | ICD-10-CM

## 2023-05-12 DIAGNOSIS — O133 Gestational [pregnancy-induced] hypertension without significant proteinuria, third trimester: Secondary | ICD-10-CM

## 2023-05-12 DIAGNOSIS — O35EXX0 Maternal care for other (suspected) fetal abnormality and damage, fetal genitourinary anomalies, not applicable or unspecified: Secondary | ICD-10-CM

## 2023-05-12 DIAGNOSIS — O30003 Twin pregnancy, unspecified number of placenta and unspecified number of amniotic sacs, third trimester: Secondary | ICD-10-CM

## 2023-05-12 LAB — CBC
HCT: 27.9 % — ABNORMAL LOW (ref 36.0–46.0)
Hemoglobin: 8.9 g/dL — ABNORMAL LOW (ref 12.0–15.0)
MCH: 28.8 pg (ref 26.0–34.0)
MCHC: 31.9 g/dL (ref 30.0–36.0)
MCV: 90.3 fL (ref 80.0–100.0)
Platelets: 321 10*3/uL (ref 150–400)
RBC: 3.09 MIL/uL — ABNORMAL LOW (ref 3.87–5.11)
RDW: 14.1 % (ref 11.5–15.5)
WBC: 12.7 10*3/uL — ABNORMAL HIGH (ref 4.0–10.5)
nRBC: 0.4 % — ABNORMAL HIGH (ref 0.0–0.2)

## 2023-05-12 LAB — COMPREHENSIVE METABOLIC PANEL
ALT: 12 U/L (ref 0–44)
AST: 25 U/L (ref 15–41)
Albumin: 2.5 g/dL — ABNORMAL LOW (ref 3.5–5.0)
Alkaline Phosphatase: 150 U/L — ABNORMAL HIGH (ref 38–126)
Anion gap: 9 (ref 5–15)
BUN: 8 mg/dL (ref 6–20)
CO2: 19 mmol/L — ABNORMAL LOW (ref 22–32)
Calcium: 9.1 mg/dL (ref 8.9–10.3)
Chloride: 106 mmol/L (ref 98–111)
Creatinine, Ser: 0.65 mg/dL (ref 0.44–1.00)
GFR, Estimated: >60 mL/min (ref >60)
Glucose, Bld: 105 mg/dL — ABNORMAL HIGH (ref 70–99)
Potassium: 4.1 mmol/L (ref 3.5–5.1)
Sodium: 134 mmol/L — ABNORMAL LOW (ref 135–145)
Total Bilirubin: 1.2 mg/dL — ABNORMAL HIGH (ref <1.2)
Total Protein: 6.2 g/dL — ABNORMAL LOW (ref 6.5–8.1)

## 2023-05-12 LAB — PROTEIN / CREATININE RATIO, URINE
Creatinine, Urine: 111 mg/dL
Protein Creatinine Ratio: 0.71 mg/mg{creat} — ABNORMAL HIGH (ref 0.00–0.15)
Total Protein, Urine: 79 mg/dL

## 2023-05-12 MED ORDER — NIFEDIPINE 10 MG PO CAPS: 10.0000 mg | ORAL_CAPSULE | ORAL | Status: DC | PRN

## 2023-05-12 MED ORDER — NIFEDIPINE 10 MG PO CAPS: 20.0000 mg | ORAL_CAPSULE | ORAL | Status: DC | PRN

## 2023-05-12 MED ORDER — LABETALOL HCL 5 MG/ML IV SOLN: 40.0000 mg | INTRAVENOUS | Status: DC | PRN

## 2023-05-12 MED ORDER — ACETAMINOPHEN 500 MG PO TABS
1000.0000 mg | ORAL_TABLET | Freq: Once | ORAL | Status: AC
  Administered 2023-05-12: 1000 mg via ORAL
  Filled 2023-05-12: qty 2

## 2023-05-12 NOTE — Procedures (Signed)
[name] [birth year] [redacted]w[redacted]d  Fetus A Non-Stress Test Interpretation for 05/12/23   BPP with NST  Indication: IUGR  Fetal Heart Rate A Mode: External Baseline Rate (A): 145 bpm Variability: Minimal, Moderate Accelerations: 15 x 15 Decelerations: None Multiple birth?: Yes  Uterine Activity Mode: Palpation, Toco Contraction Frequency (min): none Resting Tone Palpated: Relaxed  Interpretation (Fetal Testing) Nonstress Test Interpretation: Reactive Overall Impression: Reassuring for gestational age Comments: Dr. Darra Lis reviewed tracing  [name] [birth year] [redacted]w[redacted]d   Fetus B Non-Stress Test Interpretation for 05/12/23  Indication: di di twins  Fetal Heart Rate Fetus B Mode: External Baseline Rate (B): 140 BPM Variability: Moderate Accelerations: 10 x 10 (does not meet criteria for reactive NST) Decelerations: None  Uterine Activity Mode: Palpation, Toco Contraction Frequency (min): none Resting Tone Palpated: Relaxed  Interpretation (Baby B - Fetal Testing) Nonstress Test Interpretation (Baby B): Non-reactive Overall Impression (Baby B): Reassuring for gestational age Comments (Baby B): Dr. Darra Lis reviewed tracing. Scheduled for BPP today.

## 2023-05-12 NOTE — MAU Note (Signed)
.  [name] is a 32 y.o. at [redacted]w[redacted]d here in MAU reporting: was at U/S appointment and was told baby B heart rate was low and one of the twins fluid was more than the other. Reports her b/p was high last week. Was high again today c/o headache today seeing spots  and decreased fetal movement in twin b. Reports lower abd crampin on and off as well.  LMP:  Onset of complaint: this afternoon Pain score: 8 Vitals:   05/12/23 1625  BP: 139/88  Pulse: (!) 103  Resp: 18  Temp: 97.9 F (36.6 C)     FHT:A-139 B147 Lab orders placed from triage:

## 2023-05-12 NOTE — MAU Provider Note (Cosign Needed Addendum)
History  Patient is a 32 yo [phone] with di-di twins at [redacted]w[redacted]d who presents to the MAU after an 8/10 BPP for nonreactive NST for one fetus (fetus B) along with 139/82 BP and headache with vision changes (seeing spots). Fetus A was 10/10. Patient has not taken anything for her headache. She does not have RUQ pain but endorses L sided abdominal pain that she believes to be related to an infection for which she is being treated. Endorses some chest pressure that is worse when laying down. Denies new onset or worsening extremity swelling. Assessment and plan was supervised by Dr Leanora Cover.  Chief Complaint  Patient presents with   fetal heart low   Hypertension   HPI  OB History     Gravida  7   Para  2   Term  2   Preterm  0   AB  4   Living  2      SAB  3   IAB  1   Ectopic  0   Multiple  0   Live Births  2           Past Medical History:  Diagnosis Date   BV (bacterial vaginosis)    Gestational diabetes    Pregnancy induced hypertension    Trichomonas     Past Surgical History:  Procedure Laterality Date   DILATION AND CURETTAGE OF UTERUS      Family History  Problem Relation Age of Onset   Kidney Stones Mother    Healthy Father    Cancer Brother    Diabetes Brother    Hypertension Maternal Grandfather    Heart disease Neg Hx     Social History   Tobacco Use   Smoking status: Former    Current packs/day: 0.00    Types: Cigarettes    Quit date: 04/02/2013    Years since quitting: 10.1   Smokeless tobacco: Never  Vaping Use   Vaping status: Never Used  Substance Use Topics   Alcohol use: Not Currently    Comment: occasionally before pregnancy   Drug use: No    Allergies:  Allergies  Allergen Reactions   Latex Rash   Sulfa Antibiotics Hives    Medications Prior to Admission  Medication Sig Dispense Refill Last Dose   aspirin 81 MG chewable tablet Chew 1 tablet (81 mg total) by mouth daily. Start at 15 weeks 30 tablet 5 05/11/2023    Prenatal MV & Min w/FA-DHA (PRENATAL GUMMIES PO) Take 2 tablets by mouth daily.   05/12/2023   Accu-Chek Softclix Lancets lancets 100 each by Other route 4 (four) times daily. 100 each 12    Blood Glucose Monitoring Suppl (ACCU-CHEK GUIDE) w/Device KIT 1 kit by Does not apply route daily. 1 kit 0    glucose blood (ACCU-CHEK GUIDE) test strip Use as instructed 100 each 12    metroNIDAZOLE (FLAGYL) 500 MG tablet Take 1 tablet (500 mg total) by mouth 2 (two) times daily. 14 tablet 0    NIFEdipine (PROCARDIA-XL/NIFEDICAL-XL) 30 MG 24 hr tablet Take 1 tablet (30 mg total) by mouth daily. 90 tablet 1     Review of Systems  Constitutional:  Negative for chills and fever.  Eyes:  Negative for visual disturbance.  Respiratory:  Negative for chest tightness and shortness of breath.   Gastrointestinal:  Negative for abdominal pain, constipation, diarrhea and nausea.  Genitourinary:  Negative for vaginal bleeding.   Physical Exam Blood pressure (!) 141/75, pulse 95,  temperature 97.9 F (36.6 C), resp. rate 18, height 5\' 2"  (1.575 m), weight 95.3 kg, last menstrual period 09/06/2022, SpO2 99%, unknown if currently breastfeeding. Vitals:   05/12/23 1625 05/12/23 1646 05/12/23 1701 05/12/23 1716  BP: 139/88 (!) 125/90 135/87 125/82   05/12/23 1730 05/12/23 1746 05/12/23 1801 05/12/23 1904  BP: (!) 141/75 128/89 125/89 131/83   05/12/23 1931 05/12/23 2001  BP: 135/85 131/83    Physical Exam Gen: well appearing, in no acute distress Pulm: normal WOB Ext: minimal lower extremity edema  Results for orders placed or performed during the hospital encounter of 05/12/23 (from the past 24 hour(s))  Protein / creatinine ratio, urine     Status: Abnormal   Collection Time: 05/12/23  5:44 PM  Result Value Ref Range   Creatinine, Urine 111 mg/dL   Total Protein, Urine 79 mg/dL   Protein Creatinine Ratio 0.71 (H) 0.00 - 0.15 mg/mg[Cre]  Comprehensive metabolic panel     Status: Abnormal   Collection Time:  05/12/23  5:51 PM  Result Value Ref Range   Sodium 134 (L) 135 - 145 mmol/L   Potassium 4.1 3.5 - 5.1 mmol/L   Chloride 106 98 - 111 mmol/L   CO2 19 (L) 22 - 32 mmol/L   Glucose, Bld 105 (H) 70 - 99 mg/dL   BUN 8 6 - 20 mg/dL   Creatinine, Ser 0.10 0.44 - 1.00 mg/dL   Calcium 9.1 8.9 - 27.2 mg/dL   Total Protein 6.2 (L) 6.5 - 8.1 g/dL   Albumin 2.5 (L) 3.5 - 5.0 g/dL   AST 25 15 - 41 U/L   ALT 12 0 - 44 U/L   Alkaline Phosphatase 150 (H) 38 - 126 U/L   Total Bilirubin 1.2 (H) <1.2 mg/dL   GFR, Estimated >53 >66 mL/min   Anion gap 9 5 - 15  CBC     Status: Abnormal   Collection Time: 05/12/23  5:51 PM  Result Value Ref Range   WBC 12.7 (H) 4.0 - 10.5 K/uL   RBC 3.09 (L) 3.87 - 5.11 MIL/uL   Hemoglobin 8.9 (L) 12.0 - 15.0 g/dL   HCT 44.0 (L) 34.7 - 42.5 %   MCV 90.3 80.0 - 100.0 fL   MCH 28.8 26.0 - 34.0 pg   MCHC 31.9 30.0 - 36.0 g/dL   RDW 95.6 38.7 - 56.4 %   Platelets 321 150 - 400 K/uL   nRBC 0.4 (H) 0.0 - 0.2 %    MAU Course Procedures  MDM #Headache #Elevated BP Liver enzymes on 11/8 were normal with p/c ratio elevated to 0.50. Repeating CMP, P/c ratio, and CBC today to assess for preeclampsia. Giving tylenol for headache and monitoring BP. Give procardia and labetalol if elevated.  #Abnormal BPP Fetal monitoring for next 4 hours. If strip looks good, repeat BPP. If BPP is normal may discharge. If strip is poor or repeat BPP is abnormal, admit induce.  Assumed Care:  Returned from [place] with BPP 8/8 for both twins Amniotic fluid pocket of 5cm seen on Twin A Patient has IOL schedule on 05/16/23 Reviewed signs of worsening preeclampsia to return for  A:  Twin IUP at [redacted]w[redacted]d       Polyhydramnios Twin B       24% discordancy       Reassuring BPP both twins       Mild preeclampsia         Plan:  Discharge home  Monitor for worsening or decreased fetal movement           IOL on 11/18          Encouraged to return if she develops worsening of symptoms,  increase in pain, fever, or other concerning symptoms.   Aviva Signs, CNM

## 2023-05-12 NOTE — Progress Notes (Signed)
Patient information  Patient Name: [name]  Patient MRN:   [id number]  Referring practice: [facility] Referring Provider: [facility]  MFM CONSULT  [name] is a 32 y.o. [id number] at [redacted]w[redacted]d here for ultrasound and consultation.   The patient was seen today for a BPP with NST which was 8 out of 10 for fetus B (-2 for NST) and 10 out of 10 for fetus A.  Fetus a was growth restricted at 2.3% overall with normal umbilical artery Dopplers.  B is normally grown at the 43rd percentile.  There is 24% discordance.  Patient also reports that she is having a headache today.  Her blood pressure was 139/82.  Given the abnormal fetal testing (nonreactive NST) and elevated blood pressure I recommend she go to the hospital for prolonged monitoring and rule out preeclampsia.  The patient expressed understanding and will go promptly to the MAU.  I have notified the MAU providers to expect her arrival.  Sonographic findings Single intrauterine pregnancy. Fetal cardiac activity:  A: Observed, B: Observed and appears normal. Presentation: A: Cephalic, B: Cephalic. Interval fetal anatomy appears normal. Fetal biometry shows the estimated fetal weight at the A: 2.3, B: 43 percentile. 24% discordance.  Amniotic fluid volume: A: Polyhydramnios, B: Low-normal. MVP: A: 10.28, B: 5.5 cm. Placenta: A: Posterior, B: Posterior. Umbilical artery dopplers findings: -S/D:A: 1.87, B: 2.68 which are normal at this gestational age.  -Absent end-diastolic flow: A: No.  -Reversed end-diastolic flow:  A: No. BPP A: 64/33, B: 8/10 (-2 for NST)  Assessment Dichorionic diamniotic twin pregnancy in third trimester  Fetal growth restriction antepartum  Diet controlled gestational diabetes mellitus (GDM) in third trimester  [redacted] weeks gestation of pregnancy  Plan -Sent to MAU for prolonged monitoring due a non-reactive NST at 40 min and lower end of normal fluid on B (BPP technically 8/10).  After 4 hours she can  have a repeat BPP as long as the NST is reactive.  If there are decelerations then delivery should be considered.  If the BPP is a 6 out of 10 or lower delivery should be considered.  If it is a 8 out of 10 or higher without decelerations then she can be discharged and have her delivery on Monday assuming she does not have elevated blood pressure. -Preeclampsia/gestational hypertension workup should be performed with serial blood pressure assessment and laboratory work.  Review of Systems: A review of systems was performed and was negative except per HPI   Vitals and Physical Exam    05/12/2023   12:58 PM 05/06/2023    3:45 PM 05/06/2023    2:30 PM  Vitals with BMI  Height  5\' 2"    Weight  208 lbs 11 oz   BMI  38.16   Systolic 139  120  Diastolic 82  71  Pulse 93  99  Sitting comfortably on the sonogram table Nonlabored breathing Normal rate and rhythm Abdomen is nontender  Past pregnancies OB History  Gravida Para Term Preterm AB Living  7 2 2  0 4 2  SAB IAB Ectopic Multiple Live Births  3 1 0 0 2    # Outcome Date GA Lbr Len/2nd Weight Sex Type Anes PTL Lv  7 Current           6 IAB 2022     TAB     5 Term 09/05/16 [redacted]w[redacted]d 10:07 / 00:24 3.055 kg M Vag-Spont EPI  LIV  4 Term 12/06/13  [redacted]w[redacted]d 10:43 / 00:44 3.181 kg M Vag-Spont Local, EPI  LIV  3 SAB 06/28/09 [redacted]w[redacted]d         2 SAB           1 SAB             I spent 30 minutes reviewing the patients chart, including labs and images as well as counseling the patient about her medical conditions. Greater than 50% of the time was spent in direct face-to-face patient counseling.  Braxton Feathers  MFM,    05/12/2023  4:05 PM

## 2023-05-13 ENCOUNTER — Encounter: Payer: Self-pay | Admitting: Obstetrics and Gynecology

## 2023-05-13 ENCOUNTER — Ambulatory Visit (INDEPENDENT_AMBULATORY_CARE_PROVIDER_SITE_OTHER): Payer: 59 | Admitting: Obstetrics and Gynecology

## 2023-05-13 ENCOUNTER — Other Ambulatory Visit (HOSPITAL_COMMUNITY)
Admission: RE | Admit: 2023-05-13 | Discharge: 2023-05-13 | Disposition: A | Discharge status: Home / Self Care | Payer: 59 | Source: Ambulatory Visit | Attending: Obstetrics and Gynecology | Admitting: Obstetrics and Gynecology

## 2023-05-13 VITALS — BP 124/82 | HR 96 | Wt 211.0 lb

## 2023-05-13 DIAGNOSIS — Z3A35 35 weeks gestation of pregnancy: Secondary | ICD-10-CM

## 2023-05-13 DIAGNOSIS — O30043 Twin pregnancy, dichorionic/diamniotic, third trimester: Secondary | ICD-10-CM

## 2023-05-13 DIAGNOSIS — O0993 Supervision of high risk pregnancy, unspecified, third trimester: Secondary | ICD-10-CM

## 2023-05-13 DIAGNOSIS — O24410 Gestational diabetes mellitus in pregnancy, diet controlled: Secondary | ICD-10-CM

## 2023-05-13 DIAGNOSIS — O1493 Unspecified pre-eclampsia, third trimester: Secondary | ICD-10-CM | POA: Insufficient documentation

## 2023-05-13 DIAGNOSIS — O365990 Maternal care for other known or suspected poor fetal growth, unspecified trimester, not applicable or unspecified: Secondary | ICD-10-CM

## 2023-05-13 NOTE — Progress Notes (Signed)
Pt is not taking Procardia.  Pt was seen at MAU yesterday after u/s. Pt complains of some dizziness.

## 2023-05-13 NOTE — Progress Notes (Addendum)
   PRENATAL VISIT NOTE  Subjective:  [name] is a 32 y.o. [id number] at [redacted]w[redacted]d being seen today for ongoing prenatal care.  She is currently monitored for the following issues for this high-risk pregnancy and has Uterine mass; Dichorionic diamniotic twin pregnancy in third trimester; Supervision of high-risk pregnancy; Anti -Lewis a antibody positive; Fetal growth restriction antepartum; GDM (gestational diabetes mellitus); and Preeclampsia, third trimester on their problem list.  Patient reports no complaints.  Contractions: Irregular. Vag. Bleeding: None.  Movement: Present. Denies leaking of fluid.   The following portions of the patient's history were reviewed and updated as appropriate: allergies, current medications, past family history, past medical history, past social history, past surgical history and problem list.   Objective:   Vitals:   05/13/23 0947  BP: 124/82  Pulse: 96  Weight: 211 lb (95.7 kg)    Fetal Status: Fetal Heart Rate (bpm): 150/145   Movement: Present     General:  Alert, oriented and cooperative. Patient is in no acute distress.  Skin: Skin is warm and dry. No rash noted.   Cardiovascular: Normal heart rate noted  Respiratory: Normal respiratory effort, no problems with respiration noted  Abdomen: Soft, gravid, appropriate for gestational age.  Pain/Pressure: Present     Pelvic: Cervical exam performed in the presence of a chaperone Dilation: 1.5 Effacement (%): Thick Station: Ballotable  Extremities: Normal range of motion.     Mental Status: Normal mood and affect. Normal behavior. Normal judgment and thought content.   Assessment and Plan:  Pregnancy: [id number] at [redacted]w[redacted]d 1. Supervision of high risk pregnancy in third trimester Patient is doing well She reports no change in her symptoms since being discharged from MAU yesterday. She admits that the headache has improved but remains present Cultures today  2. Dichorionic diamniotic twin pregnancy in  third trimester   3. Diet controlled gestational diabetes mellitus (GDM) in third trimester CBG reviewed and remain within range  4. Fetal growth restriction antepartum   5. Preeclampsia, third trimester Scheduled for IOL on 11/18 Stable and asymptomatic  Preterm labor symptoms and general obstetric precautions including but not limited to vaginal bleeding, contractions, leaking of fluid and fetal movement were reviewed in detail with the patient. Please refer to After Visit Summary for other counseling recommendations.   Return in about 6 weeks (around 06/24/2023) for postpartum.  Future Appointments  Date Time Provider Department Center  05/16/2023  6:45 AM [facility] SCHED ROOM [facility] None  07/22/2023  8:00 AM Tobb, Lavona [place], DO [facility] None    Catalina Antigua, MD

## 2023-05-15 ENCOUNTER — Inpatient Hospital Stay (HOSPITAL_COMMUNITY): Payer: 59 | Admitting: Anesthesiology

## 2023-05-15 ENCOUNTER — Encounter (HOSPITAL_COMMUNITY): Payer: Self-pay | Admitting: Obstetrics & Gynecology

## 2023-05-15 ENCOUNTER — Inpatient Hospital Stay (HOSPITAL_COMMUNITY)
Admission: AD | Admit: 2023-05-15 | Discharge: 2023-05-17 | DRG: 768 | Disposition: A | Discharge status: Home / Self Care | Payer: 59 | Attending: Family Medicine | Admitting: Family Medicine

## 2023-05-15 DIAGNOSIS — O24419 Gestational diabetes mellitus in pregnancy, unspecified control: Secondary | ICD-10-CM | POA: Diagnosis present

## 2023-05-15 DIAGNOSIS — Z3490 Encounter for supervision of normal pregnancy, unspecified, unspecified trimester: Secondary | ICD-10-CM

## 2023-05-15 DIAGNOSIS — O9081 Anemia of the puerperium: Secondary | ICD-10-CM | POA: Diagnosis not present

## 2023-05-15 DIAGNOSIS — O24410 Gestational diabetes mellitus in pregnancy, diet controlled: Secondary | ICD-10-CM

## 2023-05-15 DIAGNOSIS — O24420 Gestational diabetes mellitus in childbirth, diet controlled: Secondary | ICD-10-CM | POA: Diagnosis present

## 2023-05-15 DIAGNOSIS — O403XX2 Polyhydramnios, third trimester, fetus 2: Secondary | ICD-10-CM | POA: Diagnosis present

## 2023-05-15 DIAGNOSIS — Z882 Allergy status to sulfonamides status: Secondary | ICD-10-CM | POA: Diagnosis not present

## 2023-05-15 DIAGNOSIS — O30009 Twin pregnancy, unspecified number of placenta and unspecified number of amniotic sacs, unspecified trimester: Principal | ICD-10-CM | POA: Diagnosis present

## 2023-05-15 DIAGNOSIS — Z8249 Family history of ischemic heart disease and other diseases of the circulatory system: Secondary | ICD-10-CM

## 2023-05-15 DIAGNOSIS — O1404 Mild to moderate pre-eclampsia, complicating childbirth: Secondary | ICD-10-CM | POA: Diagnosis present

## 2023-05-15 DIAGNOSIS — Z3A35 35 weeks gestation of pregnancy: Secondary | ICD-10-CM

## 2023-05-15 DIAGNOSIS — Z91040 Latex allergy status: Secondary | ICD-10-CM

## 2023-05-15 DIAGNOSIS — O4202 Full-term premature rupture of membranes, onset of labor within 24 hours of rupture: Secondary | ICD-10-CM | POA: Diagnosis not present

## 2023-05-15 DIAGNOSIS — O365931 Maternal care for other known or suspected poor fetal growth, third trimester, fetus 1: Secondary | ICD-10-CM | POA: Diagnosis present

## 2023-05-15 DIAGNOSIS — Z833 Family history of diabetes mellitus: Secondary | ICD-10-CM

## 2023-05-15 DIAGNOSIS — Z7982 Long term (current) use of aspirin: Secondary | ICD-10-CM | POA: Diagnosis not present

## 2023-05-15 DIAGNOSIS — O26893 Other specified pregnancy related conditions, third trimester: Secondary | ICD-10-CM | POA: Diagnosis present

## 2023-05-15 DIAGNOSIS — Z87891 Personal history of nicotine dependence: Secondary | ICD-10-CM | POA: Diagnosis not present

## 2023-05-15 DIAGNOSIS — O30043 Twin pregnancy, dichorionic/diamniotic, third trimester: Secondary | ICD-10-CM | POA: Diagnosis present

## 2023-05-15 DIAGNOSIS — Z30017 Encounter for initial prescription of implantable subdermal contraceptive: Secondary | ICD-10-CM | POA: Diagnosis not present

## 2023-05-15 DIAGNOSIS — O1493 Unspecified pre-eclampsia, third trimester: Secondary | ICD-10-CM | POA: Diagnosis present

## 2023-05-15 DIAGNOSIS — D62 Acute posthemorrhagic anemia: Secondary | ICD-10-CM | POA: Diagnosis not present

## 2023-05-15 LAB — COMPREHENSIVE METABOLIC PANEL
ALT: 13 U/L (ref 0–44)
AST: 24 U/L (ref 15–41)
Albumin: 2.4 g/dL — ABNORMAL LOW (ref 3.5–5.0)
Alkaline Phosphatase: 161 U/L — ABNORMAL HIGH (ref 38–126)
Anion gap: 9 (ref 5–15)
BUN: 6 mg/dL (ref 6–20)
CO2: 19 mmol/L — ABNORMAL LOW (ref 22–32)
Calcium: 9 mg/dL (ref 8.9–10.3)
Chloride: 106 mmol/L (ref 98–111)
Creatinine, Ser: 0.92 mg/dL (ref 0.44–1.00)
GFR, Estimated: >60 mL/min (ref >60)
Glucose, Bld: 92 mg/dL (ref 70–99)
Potassium: 4.3 mmol/L (ref 3.5–5.1)
Sodium: 134 mmol/L — ABNORMAL LOW (ref 135–145)
Total Bilirubin: 1.4 mg/dL — ABNORMAL HIGH (ref <1.2)
Total Protein: 5.4 g/dL — ABNORMAL LOW (ref 6.5–8.1)

## 2023-05-15 LAB — CBC
HCT: 26.6 % — ABNORMAL LOW (ref 36.0–46.0)
Hemoglobin: 8.7 g/dL — ABNORMAL LOW (ref 12.0–15.0)
MCH: 29.7 pg (ref 26.0–34.0)
MCHC: 32.7 g/dL (ref 30.0–36.0)
MCV: 90.8 fL (ref 80.0–100.0)
Platelets: 306 10*3/uL (ref 150–400)
RBC: 2.93 MIL/uL — ABNORMAL LOW (ref 3.87–5.11)
RDW: 14.3 % (ref 11.5–15.5)
WBC: 12.5 10*3/uL — ABNORMAL HIGH (ref 4.0–10.5)
nRBC: 0.2 % (ref 0.0–0.2)

## 2023-05-15 LAB — GLUCOSE, CAPILLARY
Glucose-Capillary: 81 mg/dL (ref 70–99)
Glucose-Capillary: 74 mg/dL (ref 70–99)
Glucose-Capillary: 68 mg/dL — ABNORMAL LOW (ref 70–99)
Glucose-Capillary: 81 mg/dL (ref 70–99)

## 2023-05-15 LAB — POCT FERN TEST: POCT Fern Test: POSITIVE

## 2023-05-15 MED ORDER — ACETAMINOPHEN 325 MG PO TABS
650.0000 mg | ORAL_TABLET | ORAL | Status: DC | PRN
Start: 2023-05-15 — End: 2023-05-16

## 2023-05-15 MED ORDER — KETOROLAC TROMETHAMINE 30 MG/ML IJ SOLN
30.0000 mg | Freq: Once | INTRAMUSCULAR | Status: AC
  Administered 2023-05-15: 30 mg via INTRAVENOUS
  Filled 2023-05-15: qty 1

## 2023-05-15 MED ORDER — LIDOCAINE HCL (PF) 1 % IJ SOLN
INTRAMUSCULAR | Status: DC | PRN
  Administered 2023-05-15 (×2): 4 mL via EPIDURAL

## 2023-05-15 MED ORDER — DIPHENHYDRAMINE HCL 50 MG/ML IJ SOLN: 12.5000 mg | INTRAMUSCULAR | Status: DC | PRN

## 2023-05-15 MED ORDER — SOD CITRATE-CITRIC ACID 500-334 MG/5ML PO SOLN: 30.0000 mL | ORAL | Status: DC | PRN

## 2023-05-15 MED ORDER — EPHEDRINE 5 MG/ML INJ: 10.0000 mg | INTRAVENOUS | Status: DC | PRN

## 2023-05-15 MED ORDER — METHYLERGONOVINE MALEATE 0.2 MG/ML IJ SOLN: 0.2000 mg | Freq: Once | INTRAMUSCULAR | Status: AC

## 2023-05-15 MED ORDER — TRANEXAMIC ACID-NACL 1000-0.7 MG/100ML-% IV SOLN: 1000.0000 mg | INTRAVENOUS | Status: AC

## 2023-05-15 MED ORDER — OXYCODONE-ACETAMINOPHEN 5-325 MG PO TABS: 1.0000 | ORAL_TABLET | ORAL | Status: DC | PRN

## 2023-05-15 MED ORDER — OXYTOCIN BOLUS FROM INFUSION
333.0000 mL | Freq: Once | INTRAVENOUS | Status: AC
  Administered 2023-05-15: 333 mL via INTRAVENOUS

## 2023-05-15 MED ORDER — LACTATED RINGERS IV SOLN: 500.0000 mL | Freq: Once | INTRAVENOUS | Status: DC

## 2023-05-15 MED ORDER — FENTANYL CITRATE (PF) 100 MCG/2ML IJ SOLN
50.0000 ug | INTRAMUSCULAR | Status: DC | PRN
  Filled 2023-05-15: qty 2

## 2023-05-15 MED ORDER — SODIUM CHLORIDE 0.9 % IV SOLN
1.0000 g | INTRAVENOUS | Status: DC
  Administered 2023-05-15: 1 g via INTRAVENOUS
  Filled 2023-05-15 (×3): qty 1000

## 2023-05-15 MED ORDER — PHENYLEPHRINE 80 MCG/ML (10ML) SYRINGE FOR IV PUSH (FOR BLOOD PRESSURE SUPPORT)
80.0000 ug | PREFILLED_SYRINGE | INTRAVENOUS | Status: DC | PRN
  Administered 2023-05-15: 80 ug via INTRAVENOUS

## 2023-05-15 MED ORDER — OXYTOCIN-SODIUM CHLORIDE 30-0.9 UT/500ML-% IV SOLN
2.5000 [IU]/h | INTRAVENOUS | Status: DC
  Administered 2023-05-15: 2.5 [IU]/h via INTRAVENOUS
  Filled 2023-05-15: qty 500

## 2023-05-15 MED ORDER — LIDOCAINE HCL (PF) 1 % IJ SOLN: 30.0000 mL | INTRAMUSCULAR | Status: DC | PRN

## 2023-05-15 MED ORDER — PHENYLEPHRINE 80 MCG/ML (10ML) SYRINGE FOR IV PUSH (FOR BLOOD PRESSURE SUPPORT)
80.0000 ug | PREFILLED_SYRINGE | INTRAVENOUS | Status: DC | PRN
  Filled 2023-05-15: qty 10

## 2023-05-15 MED ORDER — SODIUM CHLORIDE 0.9 % IV SOLN
2.0000 g | Freq: Once | INTRAVENOUS | Status: AC
  Administered 2023-05-15: 2 g via INTRAVENOUS
  Filled 2023-05-15: qty 2000

## 2023-05-15 MED ORDER — LACTATED RINGERS IV SOLN: 500.0000 mL | INTRAVENOUS | Status: DC | PRN

## 2023-05-15 MED ORDER — ONDANSETRON HCL 4 MG/2ML IJ SOLN
4.0000 mg | Freq: Four times a day (QID) | INTRAMUSCULAR | Status: DC | PRN
Start: 2023-05-15 — End: 2023-05-16
  Administered 2023-05-15 (×2): 4 mg via INTRAVENOUS
  Filled 2023-05-15 (×2): qty 2

## 2023-05-15 MED ORDER — MISOPROSTOL 25 MCG QUARTER TABLET: 25.0000 ug | ORAL_TABLET | ORAL | Status: DC | PRN

## 2023-05-15 MED ORDER — METHYLERGONOVINE MALEATE 0.2 MG/ML IJ SOLN
INTRAMUSCULAR | Status: AC
  Administered 2023-05-15: 0.2 mg via INTRAMUSCULAR
  Filled 2023-05-15: qty 1

## 2023-05-15 MED ORDER — FENTANYL-BUPIVACAINE-NACL 0.5-0.125-0.9 MG/250ML-% EP SOLN
12.0000 mL/h | EPIDURAL | Status: DC | PRN
Start: 2023-05-15 — End: 2023-05-16
  Administered 2023-05-15: 12 mL/h via EPIDURAL
  Filled 2023-05-15: qty 250

## 2023-05-15 MED ORDER — SODIUM CHLORIDE 0.9 % IV SOLN
5.0000 10*6.[IU] | Freq: Once | INTRAVENOUS | Status: DC
  Filled 2023-05-15: qty 5

## 2023-05-15 MED ORDER — OXYTOCIN-SODIUM CHLORIDE 30-0.9 UT/500ML-% IV SOLN: 1.0000 m[IU]/min | INTRAVENOUS | Status: DC

## 2023-05-15 MED ORDER — PENICILLIN G POT IN DEXTROSE 60000 UNIT/ML IV SOLN: 3.0000 10*6.[IU] | INTRAVENOUS | Status: DC

## 2023-05-15 MED ORDER — FUROSEMIDE 20 MG PO TABS
20.0000 mg | ORAL_TABLET | Freq: Every day | ORAL | Status: DC
  Administered 2023-05-16 – 2023-05-17 (×2): 20 mg via ORAL
  Filled 2023-05-15 (×2): qty 1

## 2023-05-15 MED ORDER — TERBUTALINE SULFATE 1 MG/ML IJ SOLN: 0.2500 mg | Freq: Once | INTRAMUSCULAR | Status: DC | PRN

## 2023-05-15 MED ORDER — MISOPROSTOL 200 MCG PO TABS
ORAL_TABLET | ORAL | Status: AC
  Filled 2023-05-15: qty 5

## 2023-05-15 MED ORDER — TRANEXAMIC ACID-NACL 1000-0.7 MG/100ML-% IV SOLN
INTRAVENOUS | Status: AC
  Administered 2023-05-15: 1000 mg via INTRAVENOUS
  Filled 2023-05-15: qty 100

## 2023-05-15 MED ORDER — LACTATED RINGERS IV SOLN: INTRAVENOUS | Status: DC

## 2023-05-15 MED ORDER — NIFEDIPINE ER OSMOTIC RELEASE 30 MG PO TB24: 30.0000 mg | ORAL_TABLET | Freq: Every day | ORAL | Status: DC

## 2023-05-15 MED ORDER — MISOPROSTOL 200 MCG PO TABS
1000.0000 ug | ORAL_TABLET | Freq: Once | ORAL | Status: AC
  Administered 2023-05-15: 1000 ug via RECTAL

## 2023-05-15 NOTE — Progress Notes (Signed)
Labor Progress Note [name] is a 32 y.o. [id number] at [redacted]w[redacted]d presented for SOL with twin gestation. SROM for twin B amniotic sac.   S:  Comfortable now s/p epidural.   O:  BP 115/74   Pulse 79   Temp 98.1 F (36.7 C) (Oral)   Resp 18   LMP 09/06/2022  EFM: TwinA : baseline 140 bpm/ mod variability/ 15x15 accels/ absent decels            TwinB: baseline 140 bpm/ mod variability/ 15x15 accels/ absent decels  Toco/IUPC: 3-5  SVE: Dilation: 9 Effacement (%): 90, 100 Cervical Position: Posterior Station: -3 Presentation: Vertex Exam by:: Marcelle Overlie RN Pitocin: 0 mu/min  A/P: 32 y.o. [id number] [redacted]w[redacted]d  Here for SOL with twin gestation. SROM of twin B amniotic sac. Adequate progress in active phase of labor.   1. Labor: SOL, twin gestation, now in active phase with adequate progress. Consider AROM vs. Expectant once GBS prophylaxis adequate.  2. FWB:  Twin A: Cat 1  Twin B Cat 1  3. Pain: Comfortable with epidural  4. GBS unknown, continue prophylaxis for gestational age  Anticipate NVSB.  Richardson Landry, CNM 7:20 PM

## 2023-05-15 NOTE — MAU Provider Note (Signed)
  S: [name] is a 32 y.o. [phone] at [redacted]w[redacted]d  who presents to MAU today complaining contractions irregular since 0500 with continuous leaking soaking a pad after a pop and gush. She denies vaginal bleeding. She endorses LOF. She reports normal fetal movement.    O: BP 132/87   Pulse 95   Temp 98 F (36.7 C)   Resp 18   LMP 09/06/2022  GENERAL: Well-developed, well-nourished female in no acute distress.  HEAD: Normocephalic, atraumatic.  CHEST: Normal effort of breathing, regular heart rate ABDOMEN: Soft, nontender, gravid  Pelvic exam: normal external genitalia, vulva, vagina, cervix, uterus and adnexa, CERVIX: normal appearing cervix without discharge or lesions, cervical discharge present - copious and milky.   Cervical exam:  Dilation: 5 Effacement (%): 90 Cervical Position: Posterior Station: Ballotable Presentation: Vertex Exam by:: Dr. Leanora Cover *Suspect Twin B SROM, given Twin A ballotable with BBOW  Fetal Monitoring: Twin A Baseline: 155 Variability: moderate Accelerations: present Decelerations: absent  Fetal Monitoring: Twin B Baseline: 140 Variability: moderate Accelerations: present Decelerations: absent Contractions: q3-77min  A: SIUP at [redacted]w[redacted]d  Active labor  P: Admit to L&D Desires epidural GBS unknown; culture collected. Given preterm will treat with PCN Mild preeclampsia Denies HSV A1GDM  Wyn Forster, MD 05/15/2023 2:19 PM

## 2023-05-15 NOTE — Anesthesia Preprocedure Evaluation (Signed)
Anesthesia Evaluation  Patient identified by MRN, date of birth, ID band Patient awake    Reviewed: Allergy & Precautions, NPO status , Patient's Chart, lab work & pertinent test results  History of Anesthesia Complications Negative for: history of anesthetic complications  Airway Mallampati: III  TM Distance: >3 FB Neck ROM: Full    Dental   Pulmonary neg shortness of breath, neg sleep apnea, neg COPD, neg recent URI, former smoker   Pulmonary exam normal breath sounds clear to auscultation       Cardiovascular hypertension (pre-eclampsia), (-) angina  Rhythm:Regular Rate:Normal     Neuro/Psych negative neurological ROS     GI/Hepatic negative GI ROS, Neg liver ROS,,,  Endo/Other  diabetes, Gestational    Renal/GU negative Renal ROS     Musculoskeletal   Abdominal   Peds  Hematology negative hematology ROS (+) Blood dyscrasia, anemia Lab Results      Component                Value               Date                      WBC                      12.5 (H)            05/15/2023                HGB                      8.7 (L)             05/15/2023                HCT                      26.6 (L)            05/15/2023                MCV                      90.8                05/15/2023                PLT                      306                 05/15/2023              Anesthesia Other Findings Di/di twins  Reproductive/Obstetrics (+) Pregnancy                             Anesthesia Physical Anesthesia Plan  ASA: 2  Anesthesia Plan: Epidural   Post-op Pain Management:    Induction:   PONV Risk Score and Plan:   Airway Management Planned: Natural Airway  Additional Equipment:   Intra-op Plan:   Post-operative Plan:   Informed Consent: I have reviewed the patients History and Physical, chart, labs and discussed the procedure including the risks, benefits and alternatives for  the proposed anesthesia with the patient or authorized representative who has indicated his/her understanding and acceptance.  Plan Discussed with: Anesthesiologist  Anesthesia Plan Comments: (I have discussed risks of neuraxial anesthesia including but not limited to infection, bleeding, nerve injury, back pain, headache, seizures, and failure of block. Patient denies bleeding disorders and is not currently anticoagulated. Labs have been reviewed. Risks and benefits discussed. All patient's questions answered.  )       Anesthesia Quick Evaluation

## 2023-05-15 NOTE — H&P (Cosign Needed Addendum)
OBSTETRIC ADMISSION HISTORY AND PHYSICAL  [name] is a 31 y.o. female [phone] with IUP at [redacted]w[redacted]d by LMP presenting for SROM. She reports +FMs, No LOF, no VB, no blurry vision, headaches or peripheral edema, and RUQ pain.  She plans on breast feeding. She request BTL for birth control. She received her prenatal care at [facility]   Dating: By LMP --->  Estimated Date of Delivery: 06/13/23  Sono:    @32  w 2 d, CWD,  Twin A: normal anatomy, cephalic presentation, longitudinal lie, 1514 g, 2.1%ile EFW, anterior placenta Twin B: normal anatomy, cephalic presentation, longitudinal lie, 1916 g, 30%,ile EFW, posterior placenta   Prenatal History/Complications:  - Uterine mass;  - Dichorionic diamniotic twin pregnancy in third trimester;  - Supervision of high-risk pregnancy;  - Anti -Lewis a antibody positive;  - Fetal growth restriction antepartum;  - GDM (gestational diabetes mellitus);  - Preeclampsia, third trimester   Past Medical History: Past Medical History:  Diagnosis Date   BV (bacterial vaginosis)    Gestational diabetes    Pregnancy induced hypertension    Trichomonas     Past Surgical History: Past Surgical History:  Procedure Laterality Date   DILATION AND CURETTAGE OF UTERUS      Obstetrical History: OB History     Gravida  7   Para  2   Term  2   Preterm  0   AB  4   Living  2      SAB  3   IAB  1   Ectopic  0   Multiple  0   Live Births  2           Social History Social History   Socioeconomic History   Marital status: Single    Spouse name: Not on file   Number of children: Not on file   Years of education: Not on file   Highest education level: Not on file  Occupational History   Not on file  Tobacco Use   Smoking status: Former    Current packs/day: 0.00    Types: Cigarettes    Quit date: 04/02/2013    Years since quitting: 10.1   Smokeless tobacco: Never  Vaping Use   Vaping status: Never Used  Substance and Sexual  Activity   Alcohol use: Not Currently    Comment: occasionally before pregnancy   Drug use: No   Sexual activity: Yes    Partners: Male    Birth control/protection: None    Comment: currently pregnant  Other Topics Concern   Not on file  Social History Narrative   Not on file   Social Determinants of Health   Financial Resource Strain: Not on file  Food Insecurity: Not on file  Transportation Needs: Not on file  Physical Activity: Not on file  Stress: Not on file  Social Connections: Not on file    Family History: Family History  Problem Relation Age of Onset   Kidney Stones Mother    Healthy Father    Cancer Brother    Diabetes Brother    Hypertension Maternal Grandfather    Heart disease Neg Hx     Allergies: Allergies  Allergen Reactions   Latex Rash   Sulfa Antibiotics Hives    Medications Prior to Admission  Medication Sig Dispense Refill Last Dose   aspirin 81 MG chewable tablet Chew 1 tablet (81 mg total) by mouth daily. Start at 15 weeks 30 tablet 5 05/14/2023  Prenatal MV & Min w/FA-DHA (PRENATAL GUMMIES PO) Take 2 tablets by mouth daily.   05/15/2023   Accu-Chek Softclix Lancets lancets 100 each by Other route 4 (four) times daily. 100 each 12    Blood Glucose Monitoring Suppl (ACCU-CHEK GUIDE) w/Device KIT 1 kit by Does not apply route daily. 1 kit 0    glucose blood (ACCU-CHEK GUIDE) test strip Use as instructed 100 each 12    NIFEdipine (PROCARDIA-XL/NIFEDICAL-XL) 30 MG 24 hr tablet Take 1 tablet (30 mg total) by mouth daily. (Patient not taking: Reported on 05/13/2023) 90 tablet 1      Review of Systems   All systems reviewed and negative except as stated in HPI  Blood pressure 132/87, pulse 95, temperature 98 F (36.7 C), resp. rate 18, last menstrual period 09/06/2022, unknown if currently breastfeeding. General appearance: alert, cooperative, and appears stated age Lungs: clear to auscultation bilaterally Heart: regular rate and  rhythm Abdomen: soft, non-tender; bowel sounds normal Extremities: no sign of DVT Presentation: cephalic, cephalic  Fetal monitoring Twin A: Baseline: 150 bpm, Variability: Fair (1-6 bpm), Accelerations: Reactive, and Decelerations: Absent Twin B: Baseline  155 bpm, Variability: Fair (1-6 bpm), Accelerations: Reactive, and Decelerations: Absent Uterine activityFrequency: Every 3-5 minutes, Duration: 60-90 seconds, and Intensity: strong Dilation: 5 Effacement (%): 90 Station: Ballotable Exam by:: Dr. Leanora Cover   Prenatal labs: ABO, Rh: A/Positive/-- (05/14 1407) Antibody: Positive, See Final Results (05/14 1407) Rubella: 2.38 (05/14 1407) RPR: Non Reactive (09/24 0813)  HBsAg: Negative (05/14 1407)  HIV: Non Reactive (09/24 0813)  GBS:   Unknown 2 hr Glucola GDM Genetic screening  WNL Anatomy US WNL  Prenatal Transfer Tool  Maternal Diabetes: Yes:  Diabetes Type:  Diet controlled Genetic Screening: Normal Maternal Ultrasounds/Referrals: IUGR and Other: Fetal Ultrasounds or other Referrals:  Referred to Materal Fetal Medicine  Maternal Substance Abuse:  No Significant Maternal Medications:  None Significant Maternal Lab Results:  Other: GBS unknown Number of Prenatal Visits:greater than 3 verified prenatal visits Other Comments:   NA  No results found for this or any previous visit (from the past 24 hour(s)).  Patient Active Problem List   Diagnosis Date Noted   Preeclampsia, third trimester 05/13/2023   GDM (gestational diabetes mellitus) 04/06/2023   Fetal growth restriction antepartum 03/25/2023   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   Supervision of high-risk pregnancy 11/09/2022   Dichorionic diamniotic twin pregnancy in third trimester 10/18/2022   Uterine mass 08/11/2022    Assessment/Plan:  [name] is a 32 y.o. [id number] at [redacted]w[redacted]d here for SROM in latent labor with twin gestation affected by discordant growth, Twin A IUGR 2.1%, Twin B 40%. Gestational  diabetes. Likely progression to active labor imminently.   #Labor: Patient presents with SROM and painfully contracting.  #Pain: Desires epidural.  #FWBA Cat 1  #FWBB   Cat 1  #ID: GBS unknown, prophylaxis in setting of gestational age of 57.6. Treatment with ampicillin due to advanced dilation in multiparous patient out of abundance of caution.  #MOF: Breast #MOC: BTL #Circ:  Yes   Richardson Landry, CNM  05/15/2023, 2:20 PM

## 2023-05-15 NOTE — Progress Notes (Signed)
Labor Progress Note Cielle Pareja Durnil is a 32 y.o. Q0H4742 at [redacted]w[redacted]d presented for SOL/SROM .   S:  Coping well s/p epidural, feeling more pressure.   O:  BP 129/78   Pulse 98   Temp 98.3 F (36.8 C) (Oral)   Resp 18   LMP 09/06/2022   SpO2 100%  EFM:    Twin A: baseline 135 bpm/ mod variability/ 15x15 accels/ variable decels    Twin B: baseline 130 bpm/ mod variability/ 15x15 accels/ variable decels Toco/IUPC: 3-5, moderate SVE: Dilation: 9 Effacement (%): 90, 100 Cervical Position: Posterior Station: -3 Presentation: Vertex Exam by:: Milus Glazier RN Pitocin: 0 mu/min  A/P: 32 y.o. [id number] [redacted]w[redacted]d  SOL with SROM of twin B sac. Progressing well, now in active phase of labor with adequate GBS treatment. RBA of AROM of twin A sac discussed, patient wishes to proceed. Copious amount of clear fluid noted, patient tolerated procedure well.   1. Labor: SOL, progressing well, now in active phase. Suspect rapid progression to complete s/p AROM.  2. FWB: Twin A Cat 2, overall reassuring  Twin B Cat 2, overall reassuring 3. Pain: Comfortable s/p epidural.  4. Continue prophylaxis for unknown GBS status.    Anticipate NVSB.  Richardson Landry, CNM 8:27 PM

## 2023-05-15 NOTE — MAU Note (Signed)
.  [name] is a 32 y.o. at [redacted]w[redacted]d here in MAU reporting: around 5am she felt a pop and leaking fluid since. Reports increased pelvic pain and pressure that comes and goes. Pregnant with twins induction scheduled for tomorrow  LMP:  Onset of complaint: 5am Pain score: 9 Vitals:   05/15/23 1355  BP: 132/87  Pulse: 95  Resp: 18  Temp: 98 F (36.7 C)     FHT:A150 B-145 Lab orders placed from triage:

## 2023-05-15 NOTE — Progress Notes (Signed)
Labor Progress Note [name] is a 32 y.o. [id number] at [redacted]w[redacted]d presented for SOL with twin gestation. SROM for twin B amniotic sac.   S: Pt denies any concerns at this time.   O:  BP 129/78   Pulse 98   Temp 98.3 F (36.8 C) (Oral)   Resp 18   LMP 09/06/2022   SpO2 100%  EFM: TwinA : baseline 145 bpm/ mod variability/ 10x10 accels/ no decels            TwinB: baseline 135 bpm/ mod variability/ 15x15 accels/ no decels   SVE: Dilation: 9 Effacement (%): 90, 100 Cervical Position: Posterior Station: -3 Presentation: Vertex Exam by:: Milus Glazier RN  A/P: 32 y.o. [id number] [redacted]w[redacted]d here in active labor with known di-di vertex-vertex twins.  #Labor: PROM of twin B, now in active labor  making progress  anticipate SVD soon  #FWB:  Twin A: Cat 1 Twin B Cat 1   #Pain: Comfortable with epidural   #GBS unknown on Ampicillin  #A1GDM: CBG monitoring q2h  BG have looked good  needs fasting CBG PP  #Pre-eclampsia: BP overall normotensive  no si/sx pre-e  Procardia 30  Sundra Aland, MD 8:31 PM

## 2023-05-15 NOTE — Discharge Summary (Shared)
Postpartum Discharge Summary  Date of Service updated***     Patient Name: [name] DOB: 08-31-1990 MRN: [id number]  Date of admission: 05/15/2023 Delivery date:   Remonica, Haycox [facility] [[id number]]  05/15/2023    Fortune, [place] [facility] [[id number]]  05/15/2023 Delivering provider:    Balinda Quails Imari [[id number]]  TYSHIKA, GUIDRY, [facility] [[id number]]  Tinnie Gens S Date of discharge: 05/15/2023  Admitting diagnosis: IUGR (intrauterine growth restriction) affecting care of mother, third trimester, fetus 1 [O36.5931] Intrauterine pregnancy: [redacted]w[redacted]d     Secondary diagnosis:  Principal Problem:   Twin pregnancy delivered vaginally Active Problems:   IUGR (intrauterine growth restriction) affecting care of mother, third trimester, fetus 1  Additional problems: ***    Discharge diagnosis: Preterm Pregnancy Delivered, Preeclampsia (mild), and GDM A1                                              Post partum procedures: Placement of JADA , *** Augmentation: AROM Complications: {OB Labor/Delivery Complications:20784}  Hospital course: Onset of Labor With Vaginal Delivery      32 y.o. yo A6T0160 at [redacted]w[redacted]d was admitted for preterm ROM and SOL on 05/15/2023. Labor course was uncomplicated. Postpartum course complicated by increased bleeding despite multiple uterotonics and TXA resulting in placement of JADA.  Membrane Rupture Time/Date:    Nika, Novicki [facility] [[id number]]  5:00 AM    Edel, Degrave [place] [[id number]]  5:00 AM,   Lenita, Niskanen [facility] [[id number]]  05/15/2023    Bradford, Heid [facility] [[id number]]  05/15/2023  Delivery Method:   Balinda Quails Anina [[id number]]      Adesire, Sooter [place] [[id number]]  Vaginal, Spontaneous Operative Delivery:N/A Episiotomy:    Monteen, Melis [facility] [[id number]]  None    Lashunta, Belk [facility] [[id number]]  None Lacerations:     Emmanuelle, Hamann [facility] [[id number]]      Synetta, Gogue [facility] [[id number]]    Patient had a  postpartum course complicated by ***.  She is ambulating, tolerating a regular diet, passing flatus, and urinating well. Patient is discharged home in stable condition on 05/15/23.  Newborn Data: Birth date:   Deborh, Mandel [facility] [[id number]]  05/15/2023    Maeola, Holdridge [facility] [[id number]]  05/15/2023 Birth time:   Cadie, Meiss [facility] [[id number]]  9:23 PM    Maclovia, Lu [facility] [[id number]]  9:58 PM Gender:   Fahmida, Mergel [facility] [[id number]]  Female    Babita, Liebert [facility] [[id number]]  Female Living status:   Taygan, [place] [facility] [[id number]]  Living    Sae, Eutsler [facility] [[id number]]  Living Apgars:   Teagyn, Gierke [facility] [[id number]]  8    Labrina, Keigher [place] [[id number]]  8 ,   Marisella, Saggio [place] [[id number]]  9    Jaclynn, Zelle Abie [[id number]]  9  Weight:   Dylann, Cartright [facility] [[id number]]      Samina, Nevel [facility] [[id number]]     Magnesium Sulfate received: {Mag received:30440022} BMZ received: No Rhophylac:N/A MMR:N/A T-DaP:***ordered Flu: No RSV Vaccine received: No Transfusion:{Transfusion received:30440034}  Immunizations received: Immunization History  Administered Date(s) Administered   Tdap 06/15/2016    Physical exam  Vitals:   05/15/23 2003 05/15/23 2030 05/15/23 2100 05/15/23 2249  BP: 129/78 (!) 132/92 117/81 (!) 144/110  Pulse: 98 (!) 105 (!) 102 (!) 300  Resp:  17    Temp: 98.3 F (36.8  C)     TempSrc: Oral     SpO2:       General: {Exam; general:21111117} Lochia: {Desc; appropriate/inappropriate:30686::"appropriate"} Uterine Fundus: {Desc; firm/soft:30687} Incision: {Exam; incision:21111123} DVT Evaluation: {Exam; QIH:4742595} Labs: Lab Results  Component Value Date   WBC 12.5 (H) 05/15/2023   HGB 8.7 (L) 05/15/2023   HCT 26.6 (L) 05/15/2023   MCV 90.8 05/15/2023   PLT 306 05/15/2023      Latest Ref Rng & Units 05/15/2023    2:34 PM  CMP  Glucose 70 - 99 mg/dL 92   BUN 6 - 20 mg/dL 6   Creatinine 6.38 - 7.56 mg/dL 4.33    Sodium 295 - 188 mmol/L 134   Potassium 3.5 - 5.1 mmol/L 4.3   Chloride 98 - 111 mmol/L 106   CO2 22 - 32 mmol/L 19   Calcium 8.9 - 10.3 mg/dL 9.0   Total Protein 6.5 - 8.1 g/dL 5.4   Total Bilirubin <4.1 mg/dL 1.4   Alkaline Phos 38 - 126 U/L 161   AST 15 - 41 U/L 24   ALT 0 - 44 U/L 13    Edinburgh Score:     No data to display         No data recorded  After visit meds:  Allergies as of 05/15/2023       Reactions   Latex Rash   Sulfa Antibiotics Hives     Med Rec must be completed prior to using this [facility]***        Discharge home in stable condition Infant Feeding: {Baby feeding:23562} Infant Disposition:{CHL IP OB HOME WITH YSAYTK:16010} Discharge instruction: per After Visit Summary and Postpartum booklet. Activity: Advance as tolerated. Pelvic rest for 6 weeks.  Diet: {OB XNAT:55732202} Future Appointments: Future Appointments  Date Time Provider Department Center  07/22/2023  8:00 AM Tobb, Lavona [place], DO [facility] None   Follow up Visit: Message sent to [facility] 11/17  Please schedule this patient for a In person postpartum visit in 4 weeks with the following provider: Any provider. Additional Postpartum F/U:2 hour GTT and BP check 2-3 days  High risk pregnancy complicated by: GDM and pre-eclampsia, twin pregnancy Delivery mode:  SVD Anticipated Birth Control:  Plans Interval BTL   05/15/2023 Sundra Aland, MD

## 2023-05-15 NOTE — Anesthesia Procedure Notes (Signed)
Epidural Patient location during procedure: OB Start time: 05/15/2023 3:18 PM End time: 05/15/2023 3:23 PM  Staffing Anesthesiologist: Linton Rump, MD Performed: anesthesiologist   Preanesthetic Checklist Completed: patient identified, IV checked, site marked, risks and benefits discussed, surgical consent, monitors and equipment checked, pre-op evaluation and timeout performed  Epidural Patient position: sitting Prep: DuraPrep and site prepped and draped Patient monitoring: continuous pulse ox and blood pressure Approach: midline Location: L3-L4 Injection technique: LOR saline  Needle:  Needle type: Tuohy  Needle gauge: 17 G Needle length: 9 cm and 9 Needle insertion depth: 5.5 cm Catheter type: closed end flexible Catheter size: 19 Gauge Catheter at skin depth: 10 cm Test dose: negative  Assessment Events: blood not aspirated, no cerebrospinal fluid, injection not painful, no injection resistance, no paresthesia and negative IV test  Additional Notes The patient has requested an epidural for labor pain management. Risks and benefits including, but not limited to, infection, bleeding, local anesthetic toxicity, headache, hypotension, back pain, block failure, etc. were discussed with the patient. The patient expressed understanding and consented to the procedure. I confirmed that the patient has no bleeding disorders and is not taking blood thinners. I confirmed the patient's last platelet count with the nurse. A time-out was performed immediately prior to the procedure. Please see nursing documentation for vital signs. Sterile technique was used throughout the whole procedure. Once LOR achieved, the epidural catheter threaded easily without resistance. Aspiration of the catheter was negative for blood and CSF. The epidural was dosed slowly and an infusion was started.  1 attempt(s)Reason for block:procedure for pain

## 2023-05-16 ENCOUNTER — Inpatient Hospital Stay (HOSPITAL_COMMUNITY): Admission: RE | Admit: 2023-05-16 | Payer: 59 | Source: Home / Self Care | Admitting: Family Medicine

## 2023-05-16 ENCOUNTER — Encounter (HOSPITAL_COMMUNITY): Payer: Self-pay | Admitting: Obstetrics and Gynecology

## 2023-05-16 ENCOUNTER — Inpatient Hospital Stay (HOSPITAL_COMMUNITY): Payer: 59

## 2023-05-16 LAB — CERVICOVAGINAL ANCILLARY ONLY
Chlamydia: NEGATIVE
Comment: NEGATIVE
Comment: NORMAL
Neisseria Gonorrhea: NEGATIVE

## 2023-05-16 LAB — CBC
HCT: 25.4 % — ABNORMAL LOW (ref 36.0–46.0)
Hemoglobin: 8 g/dL — ABNORMAL LOW (ref 12.0–15.0)
MCH: 27.8 pg (ref 26.0–34.0)
MCHC: 31.5 g/dL (ref 30.0–36.0)
MCV: 88.2 fL (ref 80.0–100.0)
Platelets: 287 10*3/uL (ref 150–400)
RBC: 2.88 MIL/uL — ABNORMAL LOW (ref 3.87–5.11)
RDW: 14.3 % (ref 11.5–15.5)
WBC: 21.4 10*3/uL — ABNORMAL HIGH (ref 4.0–10.5)
nRBC: 0.1 % (ref 0.0–0.2)

## 2023-05-16 LAB — RPR: RPR Ser Ql: NONREACTIVE

## 2023-05-16 LAB — GLUCOSE, CAPILLARY: Glucose-Capillary: 106 mg/dL — ABNORMAL HIGH (ref 70–99)

## 2023-05-16 MED ORDER — COCONUT OIL OIL: 1.0000 | TOPICAL_OIL | Status: DC | PRN

## 2023-05-16 MED ORDER — SIMETHICONE 80 MG PO CHEW: 80.0000 mg | CHEWABLE_TABLET | ORAL | Status: DC | PRN

## 2023-05-16 MED ORDER — SODIUM CHLORIDE 0.9% FLUSH: 3.0000 mL | INTRAVENOUS | Status: DC | PRN

## 2023-05-16 MED ORDER — SENNOSIDES-DOCUSATE SODIUM 8.6-50 MG PO TABS
2.0000 | ORAL_TABLET | ORAL | Status: DC
  Administered 2023-05-16 – 2023-05-17 (×2): 2 via ORAL
  Filled 2023-05-16 (×2): qty 2

## 2023-05-16 MED ORDER — ACETAMINOPHEN 325 MG PO TABS
650.0000 mg | ORAL_TABLET | ORAL | Status: DC | PRN
  Administered 2023-05-16 – 2023-05-17 (×3): 650 mg via ORAL
  Filled 2023-05-16 (×3): qty 2

## 2023-05-16 MED ORDER — FERROUS SULFATE 325 (65 FE) MG PO TABS
325.0000 mg | ORAL_TABLET | ORAL | Status: DC
  Administered 2023-05-16: 325 mg via ORAL
  Filled 2023-05-16: qty 1

## 2023-05-16 MED ORDER — CEFAZOLIN SODIUM-DEXTROSE 2-4 GM/100ML-% IV SOLN
2.0000 g | Freq: Once | INTRAVENOUS | Status: AC
  Administered 2023-05-16: 2 g via INTRAVENOUS
  Filled 2023-05-16: qty 100

## 2023-05-16 MED ORDER — ONDANSETRON HCL 4 MG/2ML IJ SOLN: 4.0000 mg | INTRAMUSCULAR | Status: DC | PRN

## 2023-05-16 MED ORDER — IBUPROFEN 800 MG PO TABS
800.0000 mg | ORAL_TABLET | Freq: Three times a day (TID) | ORAL | Status: DC
  Administered 2023-05-16 – 2023-05-17 (×4): 800 mg via ORAL
  Filled 2023-05-16 (×4): qty 1

## 2023-05-16 MED ORDER — KETOROLAC TROMETHAMINE 30 MG/ML IJ SOLN
30.0000 mg | Freq: Four times a day (QID) | INTRAMUSCULAR | Status: DC
  Administered 2023-05-16: 30 mg via INTRAVENOUS
  Filled 2023-05-16: qty 1

## 2023-05-16 MED ORDER — NIFEDIPINE ER OSMOTIC RELEASE 30 MG PO TB24
30.0000 mg | ORAL_TABLET | Freq: Two times a day (BID) | ORAL | Status: DC
  Administered 2023-05-16 – 2023-05-17 (×3): 30 mg via ORAL
  Filled 2023-05-16 (×3): qty 1

## 2023-05-16 MED ORDER — DIBUCAINE (PERIANAL) 1 % EX OINT: 1.0000 | TOPICAL_OINTMENT | CUTANEOUS | Status: DC | PRN

## 2023-05-16 MED ORDER — DIPHENHYDRAMINE HCL 25 MG PO CAPS: 25.0000 mg | ORAL_CAPSULE | Freq: Four times a day (QID) | ORAL | Status: DC | PRN

## 2023-05-16 MED ORDER — TETANUS-DIPHTH-ACELL PERTUSSIS 5-2.5-18.5 LF-MCG/0.5 IM SUSY: 0.5000 mL | PREFILLED_SYRINGE | Freq: Once | INTRAMUSCULAR | Status: DC

## 2023-05-16 MED ORDER — ONDANSETRON HCL 4 MG PO TABS: 4.0000 mg | ORAL_TABLET | ORAL | Status: DC | PRN

## 2023-05-16 MED ORDER — SODIUM CHLORIDE 0.9% FLUSH
3.0000 mL | Freq: Two times a day (BID) | INTRAVENOUS | Status: DC
  Administered 2023-05-16: 3 mL via INTRAVENOUS

## 2023-05-16 MED ORDER — SODIUM CHLORIDE 0.9% FLUSH: 10.0000 mL | Freq: Two times a day (BID) | INTRAVENOUS | Status: DC

## 2023-05-16 MED ORDER — WITCH HAZEL-GLYCERIN EX PADS: 1.0000 | MEDICATED_PAD | CUTANEOUS | Status: DC | PRN

## 2023-05-16 MED ORDER — BENZOCAINE-MENTHOL 20-0.5 % EX AERO
1.0000 | INHALATION_SPRAY | CUTANEOUS | Status: DC | PRN
  Filled 2023-05-16: qty 56

## 2023-05-16 MED ORDER — IBUPROFEN 600 MG PO TABS: 600.0000 mg | ORAL_TABLET | Freq: Four times a day (QID) | ORAL | Status: DC

## 2023-05-16 MED ORDER — PRENATAL MULTIVITAMIN CH
1.0000 | ORAL_TABLET | Freq: Every day | ORAL | Status: DC
  Administered 2023-05-16 – 2023-05-17 (×2): 1 via ORAL
  Filled 2023-05-16 (×2): qty 1

## 2023-05-16 NOTE — Progress Notes (Signed)
Patient started on Procardia and Lasix this AM at 0928. B/P at 1216 was 135/77 and at 1437 was 137/87. Patient asymptomatic. Notified Dr Earlene Plater. No new orders at this time.

## 2023-05-16 NOTE — Anesthesia Postprocedure Evaluation (Signed)
Anesthesia Post Note  Patient: [name]  Procedure(s) Performed: AN AD HOC LABOR EPIDURAL     Patient location during evaluation: Mother Baby Anesthesia Type: Epidural Level of consciousness: awake and alert Pain management: pain level controlled Vital Signs Assessment: post-procedure vital signs reviewed and stable Respiratory status: spontaneous breathing, nonlabored ventilation and respiratory function stable Cardiovascular status: stable Postop Assessment: no headache, no backache and epidural receding Anesthetic complications: no   No notable events documented.  Last Vitals:  Vitals:   05/16/23 0500 05/16/23 0630  BP: (!) 136/92 135/86  Pulse: 90 91  Resp: 18   Temp: 37.3 C   SpO2:      Last Pain:  Vitals:   05/16/23 0506  TempSrc:   PainSc: 7    Pain Goal:                   Joshia Kitchings

## 2023-05-16 NOTE — Progress Notes (Addendum)
Post Partum Day 1 Subjective: no complaints, up ad lib, voiding, and tolerating PO  Objective: Blood pressure 135/86, pulse 91, temperature 99.2 F (37.3 C), temperature source Oral, resp. rate 18, last menstrual period 09/06/2022, SpO2 100%, unknown if currently breastfeeding. Today's Vitals   05/16/23 0250 05/16/23 0500 05/16/23 0506 05/16/23 0630  BP: 130/82 (!) 136/92  135/86  Pulse: 96 90  91  Resp: 18 18    Temp: 99.1 F (37.3 C) 99.2 F (37.3 C)    TempSrc: Oral Oral    SpO2:      PainSc: 0-No pain  7     There is no height or weight on file to calculate BMI.  Physical Exam:  General: alert, cooperative, and appears stated age 32: appropriate Uterine Fundus: firm DVT Evaluation: No evidence of DVT seen on physical exam.  Recent Labs    05/15/23 1434 05/16/23 0019  HGB 8.7* 8.0*  HCT 26.6* 25.4*    Assessment/Plan: Plan for discharge tomorrow, Breastfeeding, Circumcision prior to discharge, and Contraception interval BTL BP is up-->increase Nifedipine to bid dosing, continue lasix Acute on chronic anemia with postpartum hemorrhage-->begin every other day oral iron   LOS: 1 day   Reva Bores, MD 05/16/2023, 7:51 AM

## 2023-05-16 NOTE — Lactation Note (Signed)
This note was copied from a baby's chart. Lactation Consultation Note  Patient Name: [name] Date: 05/16/2023 Age:34 hours Reason for consult: Initial assessment;Late-preterm 34-36.6wks;Multiple gestation;Infant < 6lbs  P3, Twins 36 weeks PMA.  Twin A Girl "[name]", Twin B Boy "[name]".  Reviewed hand expression, late preterm feeding behavior and volume guidelines.   Twin A "[name]" demonstrated feeding cues.  Opened wide and latched with ease.  Stopped feeding at 10 min.  Assisted with bottle feeding using Nfant white standard nipple.  Baby consumed 9 ml of 22 kcal Neosure using paced feeding. Twin B "[name]" attempted breastfeeding and bottle feeding but did not open to latch.  Baby spitty.  During consult infant had emesis x 3.  SLP called for assistance with bottle feeding.  Reviewed pump use.  21 mm flanges felt comfortable at this time.   Plan: Offer breast when baby cues that he/she is hungry, or awaken baby for feeding at 3 hrs. Offer breast and limit time at breast to 10 min. Ask for help prn.  Limit total feeding to 30 mins so not to overtire baby. If baby does not latch after 5  min of attempt - give supplemental breastmilk/formula using bottle nipple.  Pump both breasts 15-20 minutes on initiation setting, adding breast massage and hand expression to collect as much colostrum as possible to feed baby.  Follow volume guidelines for supplemental  EBM+/formula after breastfeeding per LPTI volume guidelines increasing per day of life and as baby desires.    Maternal Data Has patient been taught Hand Expression?: Yes Does the patient have breastfeeding experience prior to this delivery?: Yes How long did the patient breastfeed?: 2 years with last child  Feeding Mother's Current Feeding Choice: Breast Milk and Formula Lactation Tools Discussed/Used Tools: Pump;Flanges Flange Size: 21 Breast pump type: Double-Electric Breast Pump Pump Education: Setup, frequency,  and cleaning;Milk Storage Reason for Pumping: stimulation and supplemenation Pumping frequency: q 3 hours for 15 min  Interventions Interventions: Education;Breast feeding basics reviewed;Hand express;DEBP;LC Services brochure;Infant Driven Feeding Algorithm education  Discharge Pump: DEBP;Personal (Medela)  Consult Status Consult Status: Follow-up Date: 05/17/23 Follow-up type: In-patient  Dahlia Byes Boschen  RN, IBCLC 05/16/2023, 10:09 AM

## 2023-05-16 NOTE — Anesthesia Postprocedure Evaluation (Signed)
Anesthesia Post Note  Patient: [name]  Procedure(s) Performed: AN AD HOC LABOR EPIDURAL     Patient location during evaluation: Mother Baby Anesthesia Type: Epidural Level of consciousness: awake and alert Pain management: pain level controlled Vital Signs Assessment: post-procedure vital signs reviewed and stable Respiratory status: spontaneous breathing, nonlabored ventilation and respiratory function stable Cardiovascular status: stable Postop Assessment: no headache, no backache and epidural receding Anesthetic complications: no   No notable events documented.  Last Vitals:  Vitals:   05/16/23 0500 05/16/23 0630  BP: (!) 136/92 135/86  Pulse: 90 91  Resp: 18   Temp: 37.3 C   SpO2:      Last Pain:  Vitals:   05/16/23 0506  TempSrc:   PainSc: 7    Pain Goal:                   [name]

## 2023-05-17 ENCOUNTER — Other Ambulatory Visit: Payer: Self-pay | Admitting: *Deleted

## 2023-05-17 ENCOUNTER — Other Ambulatory Visit (HOSPITAL_COMMUNITY): Payer: Self-pay

## 2023-05-17 ENCOUNTER — Encounter: Payer: 59 | Admitting: Advanced Practice Midwife

## 2023-05-17 DIAGNOSIS — Z30017 Encounter for initial prescription of implantable subdermal contraceptive: Secondary | ICD-10-CM | POA: Diagnosis not present

## 2023-05-17 DIAGNOSIS — D62 Acute posthemorrhagic anemia: Secondary | ICD-10-CM | POA: Insufficient documentation

## 2023-05-17 DIAGNOSIS — O0993 Supervision of high risk pregnancy, unspecified, third trimester: Secondary | ICD-10-CM

## 2023-05-17 DIAGNOSIS — O1493 Unspecified pre-eclampsia, third trimester: Secondary | ICD-10-CM

## 2023-05-17 LAB — CULTURE, BETA STREP (GROUP B ONLY): Strep Gp B Culture: NEGATIVE

## 2023-05-17 MED ORDER — BUTALBITAL-APAP-CAFFEINE 50-325-40 MG PO TABS: 2.0000 | ORAL_TABLET | Freq: Four times a day (QID) | ORAL | Status: DC | PRN

## 2023-05-17 MED ORDER — NIFEDIPINE ER 30 MG PO TB24
30.0000 mg | ORAL_TABLET | Freq: Two times a day (BID) | ORAL | 2 refills | Status: AC
  Filled 2023-05-17: qty 60, 30d supply, fill #0

## 2023-05-17 MED ORDER — FUROSEMIDE 20 MG PO TABS
20.0000 mg | ORAL_TABLET | Freq: Every day | ORAL | 0 refills | Status: AC
  Filled 2023-05-17: qty 5, 5d supply, fill #0

## 2023-05-17 MED ORDER — IBUPROFEN 800 MG PO TABS
800.0000 mg | ORAL_TABLET | Freq: Three times a day (TID) | ORAL | 0 refills | Status: AC | PRN
  Filled 2023-05-17: qty 30, 10d supply, fill #0

## 2023-05-17 MED ORDER — BUTALBITAL-APAP-CAFFEINE 50-325-40 MG PO TABS
2.0000 | ORAL_TABLET | Freq: Four times a day (QID) | ORAL | 0 refills | Status: AC | PRN
  Filled 2023-05-17: qty 14, 2d supply, fill #0

## 2023-05-17 MED ORDER — LIDOCAINE HCL 1 % IJ SOLN
0.0000 mL | Freq: Once | INTRAMUSCULAR | Status: AC | PRN
  Administered 2023-05-17: 20 mL via INTRADERMAL
  Filled 2023-05-17: qty 20

## 2023-05-17 MED ORDER — ETONOGESTREL 68 MG ~~LOC~~ IMPL
68.0000 mg | DRUG_IMPLANT | Freq: Once | SUBCUTANEOUS | Status: AC
  Administered 2023-05-17: 68 mg via SUBCUTANEOUS
  Filled 2023-05-17: qty 1

## 2023-05-17 MED ORDER — FERROUS SULFATE 325 (65 FE) MG PO TABS
325.0000 mg | ORAL_TABLET | ORAL | 3 refills | Status: DC
  Filled 2023-05-17: qty 30, 60d supply, fill #0

## 2023-05-17 NOTE — Progress Notes (Signed)
TC to follow up on Babys[employer]s alert for BP 124/93. Of note pt was discharged in good condition following a vaginal delivery of twins just prior to time of the reading. No answer. Left VM and call back number and instructions to repeat BP at rest for 15 minutes and record new reading in Babyscripts.

## 2023-05-17 NOTE — Procedures (Signed)
     GYNECOLOGY OFFICE PROCEDURE NOTE  [name] is a 32 y.o. [phone] here for Nexplanon insertion. This is a postpartum insertion. No other gynecologic concerns.  Nexplanon Insertion Procedure Patient identified, informed consent performed, consent signed.   Patient does understand that irregular bleeding is a very common side effect of this medication. She was advised to have backup contraception for one week after placement. Pregnancy test in clinic today was negative.  Appropriate time out taken.  Patient's right arm was prepped and draped in the usual sterile fashion. The ruler used to measure and mark insertion area.  Patient was prepped with alcohol swab and then injected with 3 ml of 1% lidocaine.  She was prepped with betadine, Nexplanon removed from packaging,  Device confirmed in needle, then inserted full length of needle and withdrawn per handbook instructions. Nexplanon was able to palpated in the patient's arm; patient palpated the insert herself. There was minimal blood loss.  Patient insertion site covered with guaze and a pressure bandage to reduce any bruising.  The patient tolerated the procedure well and was given post procedure instructions.      Mariel Aloe, MD, FACOG Obstetrician & Gynecologist, [facility] for [facility], [facility]

## 2023-05-18 ENCOUNTER — Ambulatory Visit (HOSPITAL_COMMUNITY): Payer: Self-pay

## 2023-05-18 ENCOUNTER — Encounter: Payer: Self-pay | Admitting: Obstetrics and Gynecology

## 2023-05-18 LAB — SURGICAL PATHOLOGY

## 2023-05-18 NOTE — Lactation Note (Signed)
This note was copied from a baby's chart. Lactation Consultation Note  Patient Name: [facility] [place] ZOXWR'U Date: 05/18/2023 Age:34 hours Reason for consult: Follow-up assessment;Infant < 6lbs;Late-preterm 34-36.6wks;Multiple gestation Mom stated BF going well. Mom is BF and formula feeding. Mom hasn't pumped yet. Mom alone w/babies. Encouraged to call for assistance as needed.   Maternal Data    Feeding Mother's Current Feeding Choice: Breast Milk and Formula  LATCH Score                    Lactation Tools Discussed/Used    Interventions    Discharge    Consult Status Consult Status: Follow-up Date: 05/18/23 Follow-up type: In-patient    Charyl Dancer 05/18/2023, 12:36 AM

## 2023-05-18 NOTE — Lactation Note (Signed)
This note was copied from a baby's chart. Lactation Consultation Note  Patient Name: [name] ZOXWR'U Date: 05/18/2023 Age:34 hours Reason for consult: Follow-up assessment;Late-preterm 34-36.6wks;Multiple gestation;Infant < 6lbs Mom awake burping baby. Asked mom if she has been pumping any, mom stated "no not yet". Asked mom if she is putting baby to the breast before giving formula mom stated yes. Going well. Mom has no questions at this time. Mom alone w/babies. Encouraged to call if needs assistance.  Maternal Data    Feeding Mother's Current Feeding Choice: Breast Milk and Formula  LATCH Score                    Lactation Tools Discussed/Used    Interventions    Discharge    Consult Status Consult Status: Follow-up Date: 05/19/23 Follow-up type: In-patient    Charyl Dancer 05/18/2023, 12:34 AM

## 2023-05-18 NOTE — Lactation Note (Addendum)
This note was copied from a baby's chart. Lactation Consultation Note  Patient Name: Kodee Blackwood ZDGLO'V Date: 05/18/2023 Age:34 hours Reason for consult: Follow-up assessment;Late-preterm 34-36.6wks;Infant < 6lbs;Multiple gestation  P3, 35 wks- twins. Dad at bedside with mom, each holding a baby. Per mom, infants improving at breast. Daughter will eat freely at either breast, son has a preference for one breast. Mom shares baby boy starts at bottle, finishes on breast, makes it manageable when they desire to eat at the same time. Encouraged "normal" and to stimulate both breasts evenly for milk production. Mom shares breasts are filing. Mom has questions on hand pump, assembled and demonstrated- great way to start feeding- similar to hand expression. Discussed baby and milk progression, highlighting cluster feeding overnight- mom agreed that did occur last night. Discussed babies feeding 8-12x/ per day, slowly increasing stomach size. Discussed milk coming in, engorgement prevention, treatment of expressing/ pumping off enough milk to soften breast and using ice packs- 10 minutes- if needed for discomfort. Encouraged mom LC O/P available to her after DC.     Maternal Data Mom has breastfeeding experience with first baby- experienced.  Feeding Mother's Current Feeding Choice: Breast Milk and Formula   Lactation Tools Discussed/Used Breast pump type: Double-Electric Breast Pump Pump Education: Milk Storage;Setup, frequency, and cleaning Reason for Pumping: Breast stimulation, twins  Interventions Interventions: Hand express;Pre-pump if needed;Breast compression;DEBP;Hand pump;Education;LC Services brochure;CDC Guidelines for Breast Pump Cleaning  Discharge Discharge Education: Engorgement and breast care Pump: DEBP;Personal  Consult Status Consult Status: Complete Date: 05/18/23    Idamae Lusher 05/18/2023, 1:51 PM

## 2023-05-19 LAB — TYPE AND SCREEN
ABO/RH(D): A POS
Antibody Screen: POSITIVE
Unit division: 0
Unit division: 0

## 2023-05-19 LAB — BPAM RBC
Blood Product Expiration Date: 202412092359
Blood Product Expiration Date: 202412092359
Unit Type and Rh: 6200
Unit Type and Rh: 6200

## 2023-05-23 ENCOUNTER — Ambulatory Visit: Payer: 59

## 2023-05-23 ENCOUNTER — Telehealth: Payer: Self-pay

## 2023-05-23 ENCOUNTER — Other Ambulatory Visit: Payer: Self-pay | Admitting: *Deleted

## 2023-05-23 VITALS — BP 131/85

## 2023-05-23 DIAGNOSIS — O1493 Unspecified pre-eclampsia, third trimester: Secondary | ICD-10-CM

## 2023-05-23 DIAGNOSIS — Z0130 Encounter for examination of blood pressure without abnormal findings: Secondary | ICD-10-CM | POA: Diagnosis not present

## 2023-05-23 NOTE — Progress Notes (Signed)
F/U on Babyscripts notification for BPs 130s/ 80s and "non compliant." Pt was in the office today for PP BP check.

## 2023-05-23 NOTE — Telephone Encounter (Signed)
Pt here today for her PP BP check, Pt stating that she does have headached that come and go, continues to take her BP medication and her BP at home,  however the Ferrous Sulfate she had an allergic reaction to, stating that her tongue and lip were swelling.   Pt is asking what she should do regarding the Iron Medication, also pt asking about follow up for her bp ?   Please advise

## 2023-05-23 NOTE — Progress Notes (Signed)
Subjective:  [name] is a 32 y.o. female here for BP check.   Hypertension ROS: taking medications as instructed, no medication side effects noted, no TIA's, no chest pain on exertion, no dyspnea on exertion, and no swelling of ankles.    Objective:  LMP 09/06/2022   Appearance alert, well appearing, and in no distress. General exam BP noted to be well controlled today in office.    Assessment:   Blood Pressure stable.   Plan:  Current treatment plan is effective, no change in therapy..   Pt stating that she is allergic to the Ferrous sulfate- Pt is asking what to do in the mean time

## 2023-05-24 ENCOUNTER — Encounter: Payer: 59 | Admitting: Advanced Practice Midwife

## 2023-05-24 NOTE — Telephone Encounter (Signed)
Pt advised Via MyChart

## 2023-05-31 ENCOUNTER — Other Ambulatory Visit: Payer: Self-pay | Admitting: *Deleted

## 2023-05-31 DIAGNOSIS — O0993 Supervision of high risk pregnancy, unspecified, third trimester: Secondary | ICD-10-CM

## 2023-05-31 DIAGNOSIS — O1493 Unspecified pre-eclampsia, third trimester: Secondary | ICD-10-CM

## 2023-05-31 NOTE — Progress Notes (Signed)
Chart reviewed to follow up on Babyscripts notifications of non compliance with BP checks.4  BP checks registered 12/1, and 12/2.118-129/75-81. BP is appropriate. No further action needed.

## 2023-07-06 ENCOUNTER — Ambulatory Visit (INDEPENDENT_AMBULATORY_CARE_PROVIDER_SITE_OTHER): Payer: Medicaid Other | Admitting: Licensed Clinical Social Worker

## 2023-07-06 ENCOUNTER — Encounter: Payer: Self-pay | Admitting: Obstetrics and Gynecology

## 2023-07-06 ENCOUNTER — Ambulatory Visit (INDEPENDENT_AMBULATORY_CARE_PROVIDER_SITE_OTHER): Payer: 59 | Admitting: Obstetrics and Gynecology

## 2023-07-06 ENCOUNTER — Other Ambulatory Visit: Payer: 59

## 2023-07-06 DIAGNOSIS — Z8632 Personal history of gestational diabetes: Secondary | ICD-10-CM

## 2023-07-06 DIAGNOSIS — R202 Paresthesia of skin: Secondary | ICD-10-CM | POA: Diagnosis not present

## 2023-07-06 DIAGNOSIS — O0993 Supervision of high risk pregnancy, unspecified, third trimester: Secondary | ICD-10-CM | POA: Diagnosis not present

## 2023-07-06 DIAGNOSIS — F4323 Adjustment disorder with mixed anxiety and depressed mood: Secondary | ICD-10-CM

## 2023-07-06 DIAGNOSIS — F419 Anxiety disorder, unspecified: Secondary | ICD-10-CM

## 2023-07-06 MED ORDER — SERTRALINE HCL 50 MG PO TABS: 50.0000 mg | ORAL_TABLET | Freq: Every day | ORAL | 2 refills | Status: DC

## 2023-07-06 NOTE — BH Specialist Note (Signed)
 Integrated Behavioral Health Initial In-Person Visit  MRN: [id number] Name: [name]  Number of Integrated Behavioral Health Clinician visits: 1- Initial Visit  Session Start time: [phone]    Session End time: 1000  Total time in minutes: 41   Types of Service: Individual psychotherapy  Interpretor:No. Interpretor Name and Language: None   Warm Hand Off Completed.        Subjective: [name] is a 33 y.o. female accompanied by  self Patient was referred by Dr. Zina for anxiety. Patient reports the following symptoms/concerns: increased anxiety related to postpartum, adjusting to the birth of twins and physical health concerns.  Duration of problem: Months; Severity of problem: moderate  Objective: Mood: Depressed and Affect: Tearful Risk of harm to self or others: No plan to harm self or others  Life Context: Family and Social: Patient lives at home with her children.  School/Work: Patient is currently on maternity leave.  Self-Care: Discussed self care options around taking daily breaks and communicating needs with trusted support.  Life Changes: Recent birth of twins 04/2023.   Patient and/or Family's Strengths/Protective Factors: Concrete supports in place (healthy food, safe environments, etc.), Physical Health (exercise, healthy diet, medication compliance, etc.), Caregiver has knowledge of parenting & child development, and Parental Resilience  Goals Addressed: Patient will: Reduce symptoms of: anxiety and depression Increase knowledge and/or ability of: coping skills and healthy habits  Demonstrate ability to: Increase healthy adjustment to current life circumstances  Progress towards Goals: Ongoing  Interventions: Interventions utilized: Mindfulness or Management Consultant, Supportive Counseling, Psychoeducation and/or Health Education, Communication Skills, and Supportive Reflection  Standardized Assessments completed: Not Needed  Patient and/or  Family Response: The patient, who recently gave birth to twins seven weeks ago, is processing the physical and emotional aftermath of her pregnancy and childbirth. She reports ongoing physical health symptoms, including frequent headaches, dizziness, and visual disturbances (seeing black dots). The patient has a history of preeclampsia, elevated blood pressure, and fainting episodes during her pregnancy. She expresses concern that her current blood pressure medications do not seem to be effective. These physical symptoms are contributing to increased anxiety, particularly fears of passing out in front of her children due to her ongoing health issues. The patient was prescribed Zoloft  for depression and anxiety today and is beginning the medication as part of her treatment plan. She is hopeful the medication will help alleviate her anxiety symptoms and support her overall emotional well-being.  Patient Centered Plan: Patient is on the following Treatment Plan(s): Adjustments   Assessment: Patient currently experiencing ongoing physical health concerns, including headaches, dizziness, and visual disturbances, likely related to her history of preeclampsia and elevated blood pressure during pregnancy. These symptoms are contributing to increased anxiety about her health, particularly fears of fainting in front of her children, while she also navigates postpartum depression and anxiety.   Patient may benefit from continued support from integrated behavioral health.  Plan: Follow up with behavioral health clinician on : 07/19/2023 Behavioral recommendations: Begin Zoloft  as prescribed and monitor for any side effects or changes in mood/anxiety levels. Continue to monitor physical symptoms (headaches, dizziness, visual disturbances) and follow up with your medical provider to assess the effectiveness of her blood pressure medication and referral. Try journaling when headaches occur and triggers. Implement the  use of grounding and relaxation techniques to address fears of fainting and physical symptoms. Referral(s): Integrated Behavioral Health Services (In Clinic) From scale of 1-10, how likely are you to follow plan?: Patient agreeable to  above plan.   Deloyce Walthers LITTIE Seats, LCSWA

## 2023-07-06 NOTE — Progress Notes (Signed)
 Post Partum Visit Note  [name] is a 33 y.o. [phone] female who presents for a postpartum visit. She is 6 weeks postpartum following a normal spontaneous twin vaginal delivery.  I have fully reviewed the prenatal and intrapartum course. The delivery was at 35.6 gestational weeks.  Anesthesia: epidural. Postpartum course has been unremarkable. Baby is doing well. Baby is feeding by breast. Bleeding no bleeding. Bowel function is normal. Bladder function is normal. Patient is sexually active. Contraception method is Nexplanon . Postpartum depression screening: negative.   The pregnancy intention screening data noted above was reviewed. Potential methods of contraception were discussed. The patient elected to proceed with nexplanon , but wants BTL.  Pt will consider BTL in the late spring.  She notes pain in her right hip which she associates with possible sciatica.   Edinburgh Postnatal Depression Scale - 07/06/23 0832       Edinburgh Postnatal Depression Scale:  In the Past 7 Days   I have been able to laugh and see the funny side of things. 0    I have looked forward with enjoyment to things. 0    I have blamed myself unnecessarily when things went wrong. 0    I have been anxious or worried for no good reason. 3    I have felt scared or panicky for no good reason. 1    Things have been getting on top of me. 0    I have been so unhappy that I have had difficulty sleeping. 2    I have felt sad or miserable. 0    I have been so unhappy that I have been crying. 0    The thought of harming myself has occurred to me. 0    Edinburgh Postnatal Depression Scale Total 6             Health Maintenance Due  Topic Date Due   INFLUENZA VACCINE  Never done   COVID-19 Vaccine (1 - 2024-25 season) Never done    The following portions of the patient's history were reviewed and updated as appropriate: allergies, current medications, past family history, past medical history, past social  history, past surgical history, and problem list.  Review of Systems Pertinent items are noted in HPI.  Objective:  BP 119/78   Pulse 86   Ht 5' 2 (1.575 m)   Wt 193 lb (87.5 kg)   LMP 05/20/2023 (Exact Date)   Breastfeeding Yes   BMI 35.30 kg/m    General:  alert, cooperative, and no distress   Breasts:  not indicated  Lungs: clear to auscultation bilaterally  Heart:  regular rate and rhythm  Abdomen: soft, non-tender; bowel sounds normal; no masses,  no organomegaly   Wound N/a  GU exam:  normal   No decrease in ROM, normal bilateral strength in lower extremities    Assessment:     postpartum exam.   Plan:   Essential components of care per ACOG recommendations:  1.  Mood and well being: Patient with negative depression screening today. Reviewed local resources for support.  - Patient tobacco use? No.   - hx of drug use? No.    2. Infant care and feeding:  -Patient currently breastmilk feeding? Yes. Reviewed importance of draining breast regularly to support lactation.  -Social determinants of health (SDOH) reviewed in EPIC.  3. Sexuality, contraception and birth spacing - Patient does not want a pregnancy in the next year.  Desired family size is 4 children.  -  Reviewed reproductive life planning. Reviewed contraceptive methods based on pt preferences and effectiveness.  Patient desired Hormonal Implant today, followed by possible BTL - Discussed birth spacing of 18 months  4. Sleep and fatigue -Encouraged family/partner/community support of 4 hrs of uninterrupted sleep to help with mood and fatigue  5. Physical Recovery  - Discussed patients delivery and complications. She describes her labor as good. - Patient had a  vaginal delivery of twins . Patient had no laceration.  Slight postpartum bleeding with use of JADA  and uterotonics .Perineal healing reviewed. Patient expressed understanding - Patient has urinary incontinence? No. - Patient is safe to resume  physical and sexual activity  6.  Health Maintenance - HM due items addressed Yes - Last pap smear  Diagnosis  Date Value Ref Range Status  09/15/2022   Final   - Negative for intraepithelial lesion or malignancy (NILM)   Pap smear not done at today's visit.  -Breast Cancer screening indicated? No.   7. Chronic Disease/Pregnancy Condition follow up: Gestational Diabetes  Pt is concerned regarding increasing anxiety, will prescribe Zoloft  50 mg at this time and schedule integrated behavorial health.  Discussed back pain with patient and radiologist.  Due to new paresthesias will refer to neurology and get lumbar MRI to eval for nerve compression.  - PCP follow up  Jerilynn DELENA Buddle, MD [facility] for [facility], [facility]

## 2023-07-07 ENCOUNTER — Encounter: Payer: Self-pay | Admitting: Obstetrics and Gynecology

## 2023-07-07 LAB — GLUCOSE TOLERANCE, 2 HOURS
Glucose, 2 hour: 82 mg/dL (ref 70–139)
Glucose, GTT - Fasting: 82 mg/dL (ref 70–99)

## 2023-07-19 ENCOUNTER — Ambulatory Visit: Payer: Medicaid Other | Admitting: Licensed Clinical Social Worker

## 2023-07-19 DIAGNOSIS — F4323 Adjustment disorder with mixed anxiety and depressed mood: Secondary | ICD-10-CM

## 2023-07-19 NOTE — BH Specialist Note (Signed)
Integrated Behavioral Health via Telemedicine Visit  07/26/2023 [name] [id number]  Number of Integrated Behavioral Health Clinician visits: 2- Second Visit  Session Start time: 1115   Session End time: 1145  Total time in minutes: 30   Referring Provider: Dr. Donavan Foil Patient/Family location: At [name] [name] Provider location: Remote office All persons participating in visit: Patient and [name] Types of Service: Individual psychotherapy and Video visit  I connected with [name] and/or [name]'s patient via  Telephone or Engineer, civil (consulting)  (Video is Surveyor, mining) and verified that I am speaking with the correct person using two identifiers. Discussed confidentiality: Yes   I discussed the limitations of telemedicine and the availability of in person appointments.  Discussed there is a possibility of technology failure and discussed alternative modes of communication if that failure occurs.  I discussed that engaging in this telemedicine visit, they consent to the provision of behavioral healthcare and the services will be billed under their insurance.  Patient and/or legal guardian expressed understanding and consented to Telemedicine visit: Yes   Presenting Concerns: Patient and/or family reports the following symptoms/concerns: Ongoing headaches, improvements with coping strategies.  Duration of problem: Months; Severity of problem: moderate  Patient and/or Family's Strengths/Protective Factors: Concrete supports in place (healthy food, safe environments, etc.), Physical Health (exercise, healthy diet, medication compliance, etc.), and Caregiver has knowledge of parenting & child development  Goals Addressed: Patient will:  Reduce symptoms of: anxiety and depression   Increase knowledge and/or ability of: coping skills and healthy habits   Demonstrate ability to: Increase healthy adjustment to current life circumstances  Progress  towards Goals: Ongoing  Interventions: Interventions utilized:  Mindfulness or Management consultant, Supportive Counseling, Psychoeducation and/or Health Education, and Supportive Reflection Standardized Assessments completed: Not Needed  Patient and/or Family Response: The patient reports ongoing headaches and mentions that she was recently referred to a neurologist. She received a missed call to schedule an appointment and is currently waiting for a callback. The patient was encouraged to return the missed call. She notes being more intentional about sleeping when her twins are sleeping and has increased her use of coping skills, particularly journaling and meditating. The patient acknowledges challenges with finding time for breaks and self-care, as her trusted supports have been ill with colds and a virus this week, leaving her to care for the children alone at this time.  Assessment: Patient currently experiencing ongoing headaches and stress due to caregiving responsibilities, as her trusted supports are currently ill, leaving her as the sole caretaker for her twins. She is also facing challenges with self-care and taking breaks, but is making efforts to improve her well-being through increased use of journaling, meditating, and intentional sleep.   Patient may benefit from continued support from integrated behavioral health.  Plan: Follow up with behavioral health clinician on : 08/02/23 at 1:00p Behavioral recommendations: [name] is encouraged to follow up with the neurologist to address her ongoing headaches and continue using coping strategies like journaling and meditation to manage stress. It is also recommended that she prioritize small moments of self-care and rest when possible, even if it's brief, to support her well-being.  Referral(s): Integrated [employer] (In Clinic)  I discussed the assessment and treatment plan with the patient and/or parent/guardian. They were  provided an opportunity to ask questions and all were answered. They agreed with the plan and demonstrated an understanding of the instructions.   They were advised to call back or  seek an in-person evaluation if the symptoms worsen or if the condition fails to improve as anticipated.  [name], LCSWA

## 2023-07-22 ENCOUNTER — Ambulatory Visit: Payer: Medicaid Other | Attending: Cardiology | Admitting: Cardiology

## 2023-08-02 ENCOUNTER — Ambulatory Visit (INDEPENDENT_AMBULATORY_CARE_PROVIDER_SITE_OTHER): Payer: Medicaid Other | Admitting: Licensed Clinical Social Worker

## 2023-08-02 DIAGNOSIS — F4323 Adjustment disorder with mixed anxiety and depressed mood: Secondary | ICD-10-CM

## 2023-08-02 NOTE — BH Specialist Note (Signed)
 Integrated Behavioral Health via Telemedicine Visit  08/05/2023 [name] [id number]  Number of Integrated Behavioral Health Clinician visits: 3- Third Visit  Session Start time: 1300   Session End time: 1336  Total time in minutes: 36   Referring Provider: Dr. Zina Patient/Family location: At home [name] Provider location: Remote Office All persons participating in visit: Patient and [name] Types of Service: Individual psychotherapy and Video visit  I connected with [name] [name] and/or [name]'s mother via  Telephone or [name], [name] (consulting)  (Video is Surveyor, mining) and verified that I am speaking with the correct person using two identifiers. Discussed confidentiality: Yes   I discussed the limitations of telemedicine and the availability of in person appointments.  Discussed there is a possibility of technology failure and discussed alternative modes of communication if that failure occurs.  I discussed that engaging in this telemedicine visit, they consent to the provision of behavioral healthcare and the services will be billed under their insurance.  Patient and/or legal guardian expressed understanding and consented to Telemedicine visit: Yes   Presenting Concerns: Patient and/or family reports the following symptoms/concerns: Leg pains-improvements with coping strategies.  Duration of problem: Months; Severity of problem: moderate  Patient and/or Family's Strengths/Protective Factors: Social connections, Concrete supports in place (healthy food, safe environments, etc.), Physical Health (exercise, healthy diet, medication compliance, etc.), and Caregiver has knowledge of parenting & child development  Goals Addressed: Patient will:  Reduce symptoms of: anxiety and depression   Increase knowledge and/or ability of: coping skills and healthy habits   Demonstrate ability to: Increase healthy adjustment to current life  circumstances  Progress towards Goals: Ongoing  Interventions: Interventions utilized:  Mindfulness or Management Consultant, Supportive Counseling, Psychoeducation and/or Health Education, and Supportive Reflection Standardized Assessments completed: Not Needed  Patient and/or Family Response: Patient reports feeling more in control of her mental health, continuing with self-care strategies such as taking her medications, using deep breathing techniques, reading, listening to [name], and taking breaks throughout the day. During the session, patient expressed some anxiety related to returning to work. She processed her return-to-work date, which is 08/08/23. Patient works night shifts and is currently adjusting to leaving her [name] with her caregiver during nighttime hours. She requested resources for nanny services, as she is unable to find daycare availability for her night shift hours. Resources for nanny services were provided to the patient. Additionally, patient reported experiencing some leg pain, which she has discussed with her healthcare provider. A referral has been made for this concern.  Assessment: Patient currently experiencing anxiety related to her return to work and adjusting to leaving her [name] with her caregiver during night shifts. She is also managing some leg pain, which she has addressed with her healthcare provider. Despite these challenges, she reports feeling more in control of her mental health through self-care strategies.  Patient may benefit from continued support from integrated behavioral health.   Plan: Follow up with behavioral health clinician on : 08/16/2023 Behavioral recommendations: [name] to continue self-care strategies, such as taking medications, practicing deep breathing, and taking breaks throughout the day. It is recommended that she explore the nanny services provided to help with childcare during her night shifts and follow up with her healthcare  provider regarding her leg pain referral. Referral(s): Integrated [employer] (In Clinic)  I discussed the assessment and treatment plan with the patient and/or parent/guardian. They were provided an opportunity to ask questions and all were answered. They agreed with  the plan and demonstrated an understanding of the instructions.   They were advised to call back or seek an in-person evaluation if the symptoms worsen or if the condition fails to improve as anticipated.  [name], LCSWA

## 2023-08-15 ENCOUNTER — Other Ambulatory Visit (HOSPITAL_COMMUNITY): Payer: Medicaid Other

## 2023-08-16 ENCOUNTER — Encounter: Payer: Medicaid Other | Admitting: Licensed Clinical Social Worker

## 2023-08-29 NOTE — BH Specialist Note (Unsigned)
 Integrated Behavioral Health via Telemedicine Visit  08/29/2023 [name] [id number]  Number of Integrated Behavioral Health Clinician visits: 3- Third Visit  Session Start time: 1300   Session End time: 1336  Total time in minutes: 36   Referring Provider: Mariel Aloe, MD Patient/Family location: Home*** [facility] Provider location: Center for [facility] at [facility]  All persons participating in visit: [name] and [facility] [name] ***  Types of Service: {CHL AMB TYPE OF SERVICE:[phone]}  I connected with [name] and/or [name]'s {family members:20773} via  Telephone or [employer]  (Video is Surveyor, mining) and verified that I am speaking with the correct person using two identifiers. Discussed confidentiality: Yes   I discussed the limitations of telemedicine and the availability of in person appointments.  Discussed there is a possibility of technology failure and discussed alternative modes of communication if that failure occurs.  I discussed that engaging in this telemedicine visit, they consent to the provision of behavioral healthcare and the services will be billed under their insurance.  Patient and/or legal guardian expressed understanding and consented to Telemedicine visit: Yes   Presenting Concerns: Patient and/or family reports the following symptoms/concerns: *** Duration of problem: ***; Severity of problem: {Mild/Moderate/Severe:20260}  Patient and/or Family's Strengths/Protective Factors: {CHL AMB BH PROTECTIVE [phone]}  Goals Addressed: Patient will:  Reduce symptoms of: {IBH Symptoms:21014056}   Increase knowledge and/or ability of: {IBH Patient Tools:21014057}   Demonstrate ability to: {IBH Goals:21014053}  Progress towards Goals: {CHL AMB BH PROGRESS TOWARDS GOALS:[phone]}  Interventions: Interventions utilized:  {IBH  Interventions:21014054} Standardized Assessments completed: {IBH Screening Tools:21014051}  Patient and/or Family Response: Patient agrees with treatment plan. ***  Assessment: Patient currently experiencing ***.   Patient may benefit from psychoeducation and brief therapeutic interventions regarding coping with symptoms of *** .  Plan: Follow up with behavioral health clinician on : *** Behavioral recommendations:  -*** -*** Referral(s): {IBH Referrals:21014055}  I discussed the assessment and treatment plan with the patient and/or parent/guardian. They were provided an opportunity to ask questions and all were answered. They agreed with the plan and demonstrated an understanding of the instructions.   They were advised to call back or seek an in-person evaluation if the symptoms worsen or if the condition fails to improve as anticipated.  [name] Close Hannahgrace Lalli, LCSW     03/22/2023    8:29 AM 11/30/2022    3:15 PM 11/09/2022    1:36 PM  Depression screen PHQ 2/9  Decreased Interest 0 0 0  Down, Depressed, Hopeless 0 0 0  PHQ - 2 Score 0 0 0  Altered sleeping 0 0 1  Tired, decreased energy 1 0 0  Change in appetite 0 0 0  Feeling bad or failure about yourself  0 0 0  Trouble concentrating 0 0 0  Moving slowly or fidgety/restless 0 0 0  Suicidal thoughts 0 0 0  PHQ-9 Score 1 0 1  Difficult doing work/chores Not difficult at all Not difficult at all       03/22/2023    8:30 AM 11/30/2022    3:15 PM 11/09/2022    1:37 PM  GAD 7 : Generalized Anxiety Score  Nervous, Anxious, on Edge 0 1 0  Control/stop worrying 0 0 0  Worry too much - different things 0 0 0  Trouble relaxing 0 1 1  Restless 0 0 1  Easily annoyed or irritable 0 1 0  Afraid -  awful might happen 0 0 0  Total GAD 7 Score 0 3 2  Anxiety Difficulty Not difficult at all Not difficult at all

## 2023-09-08 ENCOUNTER — Ambulatory Visit: Payer: Medicaid Other | Admitting: Clinical

## 2023-09-08 ENCOUNTER — Encounter: Payer: Self-pay | Admitting: Family Medicine

## 2023-09-08 DIAGNOSIS — F4323 Adjustment disorder with mixed anxiety and depressed mood: Secondary | ICD-10-CM

## 2023-09-08 NOTE — Patient Instructions (Signed)
 Center for [facility] Healthcare at [facility] Women [address] [place], [place] [address] [phone] (main office) [phone] (Pama Roskos's office)  [facility] [email or web address]   New Parent Support Groups [email or web address]  [place] [employer]  (Childcare options, Early childcare development, etc.) [email or web address]  [employer] Child Care Facility Search Engine  [email or web address]

## 2023-09-09 NOTE — BH Specialist Note (Unsigned)
 Pt did not arrive to video visit and did not answer the phone; Left HIPPA-compliant message to call back Asher Muir from [employer] for [employer] at [facility] for Women at  [phone] [facility] office).  ; left MyChart message for patient.

## 2023-09-21 ENCOUNTER — Other Ambulatory Visit: Payer: Self-pay | Admitting: Obstetrics and Gynecology

## 2023-09-21 DIAGNOSIS — F419 Anxiety disorder, unspecified: Secondary | ICD-10-CM

## 2023-09-23 ENCOUNTER — Ambulatory Visit: Admitting: Clinical

## 2023-09-23 DIAGNOSIS — Z91199 Patient's noncompliance with other medical treatment and regimen due to unspecified reason: Secondary | ICD-10-CM

## 2023-10-05 ENCOUNTER — Ambulatory Visit (INDEPENDENT_AMBULATORY_CARE_PROVIDER_SITE_OTHER): Payer: Medicaid Other | Admitting: Neurology

## 2023-10-05 ENCOUNTER — Encounter: Payer: Self-pay | Admitting: Neurology

## 2023-10-05 VITALS — BP 134/82 | HR 90 | Ht 62.0 in | Wt 199.2 lb

## 2023-10-05 DIAGNOSIS — R29898 Other symptoms and signs involving the musculoskeletal system: Secondary | ICD-10-CM | POA: Diagnosis not present

## 2023-10-05 DIAGNOSIS — O2690 Pregnancy related conditions, unspecified, unspecified trimester: Secondary | ICD-10-CM

## 2023-10-05 DIAGNOSIS — M5441 Lumbago with sciatica, right side: Secondary | ICD-10-CM | POA: Diagnosis not present

## 2023-10-05 DIAGNOSIS — R200 Anesthesia of skin: Secondary | ICD-10-CM | POA: Diagnosis not present

## 2023-10-05 DIAGNOSIS — G8929 Other chronic pain: Secondary | ICD-10-CM | POA: Diagnosis not present

## 2023-10-05 DIAGNOSIS — M5442 Lumbago with sciatica, left side: Secondary | ICD-10-CM

## 2023-10-05 DIAGNOSIS — M5416 Radiculopathy, lumbar region: Secondary | ICD-10-CM

## 2023-10-05 MED ORDER — GABAPENTIN 300 MG PO CAPS: 300.0000 mg | ORAL_CAPSULE | Freq: Three times a day (TID) | ORAL | 11 refills | Status: AC

## 2023-10-05 NOTE — Patient Instructions (Addendum)
 MRI lumbar spine After MRI lumbar spine can consider physical therapy or further testing such as EMG/NCS or if we see something on the mri we can address Gabapentin as needed  Electromyoneurogram Electromyoneurogram is a test to check how well your muscles and nerves are working. This procedure includes the combined use of electromyogram (EMG) and nerve conduction study (NCS). EMG is used to evaluate muscles and the nerves that control those muscles. NCS, which is also called electroneurogram, measures how well your nerves conduct electricity. The procedures should be done together to check if your muscles and nerves are healthy. If the results of the tests are abnormal, this may indicate disease or injury, such as a neuromuscular disease or peripheral nerve damage. Tell a health care provider about: Any allergies you have. All medicines you are taking, including vitamins, herbs, eye drops, creams, and over-the-counter medicines. Any bleeding problems you have. Any surgeries you have had. Any medical conditions you have. What are the risks? Generally, this is a safe procedure. However, problems may occur, including: Bleeding or bruising. Infection where the electrodes were inserted. What happens before the test? Medicines Take all of your usually prescribed medications before this testing is performed. Do not stop your blood thinners unless advised by your prescribing physician. General instructions Your health care provider may ask you to warm the limb that will be checked with warm water, hot pack, or wrapping the limb in a blanket. Do not use lotions or creams on the same day that you will be having the procedure. What happens during the test? For EMG  Your health care provider will ask you to stay in a position so that the muscle being studied can be accessed. You will be sitting or lying down. You may be given a medicine to numb the area (local anesthetic) and the skin will be  disinfected. A very thin needle that has an electrode will be inserted into your muscle, one muscle at a time. Typically, multiple muscles are evaluated during a single study. Another small electrode will be placed on your skin near the muscle. Your health care provider will ask you to continue to remain still. The electrodes will record the electrical activity of your muscles. You may see this on a monitor or hear it in the room. After your muscles have been studied at rest, your health care provider will ask you to contract or flex your muscles. The electrodes will record the electrical activity of your muscles. Your health care provider will remove the electrodes and the electrode needle when the procedure is finished. The procedure may vary among health care providers and hospitals. For NCS  An electrode that records your nerve activity (recording electrode) will be placed on your skin by the muscle that is being studied. An electrode that is used as a reference (reference electrode) will be placed near the recording electrode. A paste or gel will be applied to your skin between the recording electrode and the reference electrode. Your nerve will be stimulated with a mild shock. The speed of the nerves and strength of response is recorded by the electrodes. Your health care provider will remove the electrodes and the gel when the procedure is finished. The procedure may vary among health care providers and hospitals. What can I expect after the test? It is up to you to get your test results. Ask your health care provider, or the department that is doing the test, when your results will be ready. Your health care  provider may: Give you medicines for any pain. Monitor the insertion sites to make sure that bleeding stops. You should be able to drive yourself to and from the test. Discomfort can persist for a few hours after the test, but should be better the next day. Contact a health care  provider if: You have swelling, redness, or drainage at any of the insertion sites. Summary Electromyoneurogram is a test to check how well your muscles and nerves are working. If the results of the tests are abnormal, this may indicate disease or injury. This is a safe procedure. However, problems may occur, such as bleeding and infection. Your health care provider will do two tests to complete this procedure. One checks your muscles (EMG) and another checks your nerves (NCS). It is up to you to get your test results. Ask your health care provider, or the department that is doing the test, when your results will be ready. This information is not intended to replace advice given to you by your health care provider. Make sure you discuss any questions you have with your health care provider. Document Revised: 02/25/2021 Document Reviewed: 01/25/2021 Elsevier Patient Education  2024 Elsevier Inc.  Gabapentin Capsules or Tablets What is this medication? GABAPENTIN (GA ba pen tin) treats nerve pain. It may also be used to prevent and control seizures in people with epilepsy. It works by calming overactive nerves in your body. This medicine may be used for other purposes; ask your health care provider or pharmacist if you have questions. COMMON BRAND NAME(S): Active-PAC with Gabapentin, Ascencion Dike, Gralise, Neurontin What should I tell my care team before I take this medication? They need to know if you have any of these conditions: Kidney disease Lung or breathing disease Substance use disorder Suicidal thoughts, plans, or attempt by you or a family member An unusual or allergic reaction to gabapentin, other medications, foods, dyes, or preservatives Pregnant or trying to get pregnant Breastfeeding How should I use this medication? Take this medication by mouth with a glass of water. Follow the directions on the prescription label. You can take it with or without food. If it upsets your  stomach, take it with food. Take your medication at regular intervals. Do not take it more often than directed. Do not stop taking except on your care team's advice. If you are directed to break the 600 or 800 mg tablets in half as part of your dose, the extra half tablet should be used for the next dose. If you have not used the extra half tablet within 28 days, it should be thrown away. A special MedGuide will be given to you by the pharmacist with each prescription and refill. Be sure to read this information carefully each time. Talk to your care team about the use of this medication in children. While this medication may be prescribed for children as young as 3 years for selected conditions, precautions do apply. Overdosage: If you think you have taken too much of this medicine contact a poison control center or emergency room at once. NOTE: This medicine is only for you. Do not share this medicine with others. What if I miss a dose? If you miss a dose, take it as soon as you can. If it is almost time for your next dose, take only that dose. Do not take double or extra doses. What may interact with this medication? Alcohol Antihistamines for allergy, cough, and cold Certain medications for anxiety or sleep Certain medications for depression  like amitriptyline, fluoxetine, sertraline Certain medications for seizures like phenobarbital, primidone Certain medications for stomach problems General anesthetics like halothane, isoflurane, methoxyflurane, propofol Local anesthetics like lidocaine, pramoxine, tetracaine Medications that relax muscles for surgery Opioid medications for pain Phenothiazines like chlorpromazine, mesoridazine, prochlorperazine, thioridazine This list may not describe all possible interactions. Give your health care provider a list of all the medicines, herbs, non-prescription drugs, or dietary supplements you use. Also tell them if you smoke, drink alcohol, or use illegal  drugs. Some items may interact with your medicine. What should I watch for while using this medication? Visit your care team for regular checks on your progress. You may want to keep a record at home of how you feel your condition is responding to treatment. You may want to share this information with your care team at each visit. You should contact your care team if your seizures get worse or if you have any new types of seizures. Do not stop taking this medication or any of your seizure medications unless instructed by your care team. Stopping your medication suddenly can increase your seizures or their severity. This medication may cause serious skin reactions. They can happen weeks to months after starting the medication. Contact your care team right away if you notice fevers or flu-like symptoms with a rash. The rash may be red or purple and then turn into blisters or peeling of the skin. Or, you might notice a red rash with swelling of the face, lips or lymph nodes in your neck or under your arms. Wear a medical identification bracelet or chain if you are taking this medication for seizures. Carry a card that lists all your medications. This medication may affect your coordination, reaction time, or judgment. Do not drive or operate machinery until you know how this medication affects you. Sit up or stand slowly to reduce the risk of dizzy or fainting spells. Drinking alcohol with this medication can increase the risk of these side effects. Your mouth may get dry. Chewing sugarless gum or sucking hard candy, and drinking plenty of water may help. Watch for new or worsening thoughts of suicide or depression. This includes sudden changes in mood, behaviors, or thoughts. These changes can happen at any time but are more common in the beginning of treatment or after a change in dose. Call your care team right away if you experience these thoughts or worsening depression. If you become pregnant while using  this medication, you may enroll in the [place] American Antiepileptic Drug Pregnancy Registry by calling [phone]. This registry collects information about the safety of antiepileptic medication use during pregnancy. What side effects may I notice from receiving this medication? Side effects that you should report to your care team as soon as possible: Allergic reactions or angioedema--skin rash, itching, hives, swelling of the face, eyes, lips, tongue, arms, or legs, trouble swallowing or breathing Rash, fever, and swollen lymph nodes Thoughts of suicide or self harm, worsening mood, feelings of depression Trouble breathing Unusual changes in mood or behavior in children after use such as difficulty concentrating, hostility, or restlessness Side effects that usually do not require medical attention (report to your care team if they continue or are bothersome): Dizziness Drowsiness Nausea Swelling of ankles, feet, or hands Vomiting This list may not describe all possible side effects. Call your doctor for medical advice about side effects. You may report side effects to FDA at 1-800-FDA-1088. Where should I keep my medication? Keep out of reach of children  and pets. Store at room temperature between 15 and 30 degrees C (59 and 86 degrees F). Get rid of any unused medication after the expiration date. This medication may cause accidental overdose and death if taken by other adults, children, or pets. To get rid of medications that are no longer needed or have expired: Take the medication to a medication take-back program. Check with your pharmacy or law enforcement to find a location. If you cannot return the medication, check the label or package insert to see if the medication should be thrown out in the garbage or flushed down the toilet. If you are not sure, ask your care team. If it is safe to put it in the trash, empty the medication out of the container. Mix the medication with cat  litter, dirt, coffee grounds, or other unwanted substance. Seal the mixture in a bag or container. Put it in the trash. NOTE: This sheet is a summary. It may not cover all possible information. If you have questions about this medicine, talk to your doctor, pharmacist, or health care provider.  2024 Elsevier/Gold Standard (2022-03-30 00:00:00)

## 2023-10-05 NOTE — Progress Notes (Signed)
 [facility]    Provider:  Dr Tresia Fruit Requesting Provider: Abigail Abler, MD Primary Care Provider:  Patient, No Pcp Per  CC:  leg weakness after delivering twins  HPI:  [facility] Hights Nicklin is a 33 y.o. female here as requested by Abigail Abler, MD for paresthesias. During her pregnancy her legs would go numb, cramping, she has 76 month old tins, started in the upper thighs and spread to the feet, started in one leg and worsened as the pregnancy progressed. She saw a pelvic floor specialist. Worse especially near the end of the pregnancy. No pattern, all over the legs. Continued after the pregnancy. Mri lumbar spine ordered and she has new insurance. She is slowly progressively improving. More her right leg now, her right leg will go numb and the feet will cramp up and lock. Cramping anterior lower leg  and numbness tingling same spot and she has back pain and shooting pain to the back of the leg worse when walking. She has weakness going up the stairs. Worse when laying down. She works 10-630am  Reviewed notes, labs and imaging from outside physicians, which showed:  03/07/2023: CLINICAL DATA:  Syncope   EXAM: CT HEAD WITHOUT CONTRAST   TECHNIQUE: Contiguous axial images were obtained from the base of the skull through the vertex without intravenous contrast.   RADIATION DOSE REDUCTION: This exam was performed according to the departmental dose-optimization program which includes automated exposure control, adjustment of the mA and/or kV according to patient size and/or use of iterative reconstruction technique.   COMPARISON:  None Available.   FINDINGS: Brain: There is no mass, hemorrhage or extra-axial collection. The size and configuration of the ventricles and extra-axial CSF spaces are normal. The brain parenchyma is normal, without acute or chronic infarction.   Vascular: No abnormal hyperdensity of the major intracranial arteries or dural venous sinuses.  No intracranial atherosclerosis.   Skull: The visualized skull base, calvarium and extracranial soft tissues are normal.   Sinuses/Orbits: No fluid levels or advanced mucosal thickening of the visualized paranasal sinuses. No mastoid or middle ear effusion. The orbits are normal.   IMPRESSION: Normal head CT.     Latest Ref Rng & Units 05/16/2023   12:19 AM 05/15/2023    2:34 PM 05/12/2023    5:51 PM  CBC  WBC 4.0 - 10.5 K/uL 21.4  12.5  12.7   Hemoglobin 12.0 - 15.0 g/dL 8.0  8.7  8.9   Hematocrit 36.0 - 46.0 % 25.4  26.6  27.9   Platelets 150 - 400 K/uL 287  306  321       Latest Ref Rng & Units 05/15/2023    2:34 PM 05/12/2023    5:51 PM 05/06/2023    2:22 PM  CMP  Glucose 70 - 99 mg/dL 92  213  80   BUN 6 - 20 mg/dL 6  8  <5   Creatinine 0.86 - 1.00 mg/dL 5.78  4.69  6.29   Sodium 135 - 145 mmol/L 134  134  134   Potassium 3.5 - 5.1 mmol/L 4.3  4.1  3.9   Chloride 98 - 111 mmol/L 106  106  107   CO2 22 - 32 mmol/L 19  19  20    Calcium 8.9 - 10.3 mg/dL 9.0  9.1  9.1   Total Protein 6.5 - 8.1 g/dL 5.4  6.2  6.1   Total Bilirubin <1.2 mg/dL 1.4  1.2  1.3   Alkaline Phos  38 - 126 U/L 161  150  144   AST 15 - 41 U/L 24  25  25    ALT 0 - 44 U/L 13  12  12       Review of Systems: Patient complains of symptoms per HPI as well as the following symptoms none. Pertinent negatives and positives per HPI. All others negative.   Social History   Socioeconomic History   Marital status: Single    Spouse name: Not on file   Number of children: Not on file   Years of education: Not on file   Highest education level: Not on file  Occupational History   Not on file  Tobacco Use   Smoking status: Former    Current packs/day: 0.00    Types: Cigarettes    Quit date: 04/02/2013    Years since quitting: 10.5   Smokeless tobacco: Never  Vaping Use   Vaping status: Never Used  Substance and Sexual Activity   Alcohol use: Not Currently    Comment: occasionally before  pregnancy   Drug use: No   Sexual activity: Yes    Partners: Male    Birth control/protection: Implant    Comment: currently pregnant  Other Topics Concern   Not on file  Social History Narrative   Caffiene 2 redbull per night   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)   Lives with mom, 2 twins, (62 yo and 52 yo.)      Social Drivers of Corporate investment banker Strain: Not on file  Food Insecurity: No Food Insecurity (05/15/2023)   Hunger Vital Sign    Worried About Running Out of Food in the Last Year: Never true    Ran Out of Food in the Last Year: Never true  Transportation Needs: No Transportation Needs (05/15/2023)   PRAPARE - Administrator, Civil Service (Medical): No    Lack of Transportation (Non-Medical): No  Physical Activity: Not on file  Stress: Not on file  Social Connections: Not on file  Intimate Partner Violence: Not At Risk (05/15/2023)   Humiliation, Afraid, Rape, and Kick questionnaire    Fear of Current or Ex-Partner: No    Emotionally Abused: No    Physically Abused: No    Sexually Abused: No    Family History  Problem Relation Age of Onset   Kidney Stones Mother    Healthy Father    Cancer Brother    Diabetes Brother    Hypertension Maternal Grandfather    Heart disease Neg Hx     Past Medical History:  Diagnosis Date   BV (bacterial vaginosis)    Gestational diabetes    Pregnancy induced hypertension    Trichomonas     Patient Active Problem List   Diagnosis Date Noted   Acute on chronic blood loss anemia 05/17/2023   IUGR (intrauterine growth restriction) affecting care of mother, third trimester, fetus 1 05/15/2023   Preeclampsia, third trimester 05/13/2023   GDM (gestational diabetes mellitus) 04/06/2023   Fetal growth restriction antepartum 03/25/2023   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 14 tablet 0   furosemide (LASIX) 20 MG tablet Take 1 tablet (20 mg total) by mouth daily. 5 tablet 0   gabapentin (NEURONTIN) 300 MG capsule Take 1 capsule (300 mg total) by mouth 3 (three) times daily. 90 capsule 11   ibuprofen (ADVIL) 800 MG tablet Take 1 tablet (800 mg total) by mouth every 8 (eight) hours as needed. 30 tablet 0   NIFEdipine (ADALAT CC) 30 MG 24 hr tablet Take 1 tablet (30 mg total) by mouth 2 (two) times daily. 60 tablet 2   Prenatal MV & Min w/FA-DHA (PRENATAL GUMMIES PO) Take 2 tablets by mouth daily.     sertraline (ZOLOFT) 50 MG tablet TAKE 1 TABLET(50 MG) BY MOUTH DAILY 30 tablet 2   No current facility-administered medications for this visit.    Allergies as of 10/05/2023 - Review Complete 10/05/2023  Allergen Reaction Noted   Ferrous sulfate Swelling 05/23/2023   Latex Rash 09/05/2012   Sulfa antibiotics Hives 06/29/2013    Vitals: BP 134/82 (Cuff Size: Normal)   Pulse 90   Ht 5\' 2"  (1.575 m)   Wt 199 lb 3.2 oz (90.4 kg)   BMI 36.43 kg/m  Last Weight:  Wt Readings from Last 1 Encounters:  10/05/23 199 lb 3.2 oz (90.4 kg)   Last Height:   Ht Readings from Last 1 Encounters:  10/05/23 5\' 2"  (1.575 m)     Physical exam: Exam: Gen: NAD, conversant, well nourised, obese, well groomed                     CV: RRR, no MRG. No Carotid Bruits. No peripheral edema, warm, nontender Eyes: Conjunctivae clear without exudates or hemorrhage  Neuro: Detailed Neurologic Exam  Speech:    Speech is normal; fluent and spontaneous with normal comprehension.  Cognition:    The patient is oriented to person, place, and time;     recent and remote memory  intact;     language fluent;     normal attention, concentration,     fund of knowledge Cranial Nerves:    The pupils are equal, round, and reactive to light. The fundi are normal and spontaneous venous pulsations are present. Visual fields are full to finger confrontation. Extraocular movements are intact. Trigeminal sensation is intact and the muscles of mastication are normal. The face is symmetric. The palate elevates in the midline. Hearing intact. Voice is normal. Shoulder shrug is normal. The tongue has normal motion without fasciculations.   Coordination: nml  Gait: nml  Motor Observation:    No asymmetry, no atrophy, and no involuntary movements noted. Tone:    Normal muscle tone.    Posture:    Posture is normal. normal erect    Strength: left leg prox weakness may be due to pain, right leg hip flexion 4/5, leg flexion 4+/5, leg extension 4+/5.     Strength is V/V in the upper and lower limbs.      Sensation: intact to LT     Reflex Exam:  DTR's:    Deep tendon reflexes in the upper and lower extremities are normal bilaterally.   Toes:    The toes are downgoing bilaterally.   Clonus:    Clonus is absent.    Assessment/Plan:  Patient with low back pain, radiculopathy, weakness in LE  since delivery likely compressive in etiology she delivered twins. Been under the care of her OB,failed consrvative therapies, will order MRi of the lumbar  spine for evaluation for stenosis and surgical or other inytervention   MRI lumbar spine After MRI lumbar spine can consider physical therapy or further testing such as EMG/NCS or if we see something on the mri we can address Gabapentin as needed  Orders Placed This Encounter  Procedures   MR LUMBAR SPINE WO CONTRAST   Meds ordered this encounter  Medications   gabapentin (NEURONTIN) 300 MG capsule    Sig: Take 1 capsule (300 mg total) by mouth 3 (three) times daily.    Dispense:  90 capsule    Refill:  11    Cc: Abigail Abler, MD,  Patient, No Pcp Per  Aldona Amel, MD  [facility] [address] [place], [place] [address]  Phone [phone] Fax [phone]

## 2023-10-13 ENCOUNTER — Telehealth: Payer: Self-pay | Admitting: Neurology

## 2023-10-13 NOTE — Telephone Encounter (Signed)
 sent to [facility], [facility] [place]: [id number] exp. 10/13/23-12/11/23  [phone]

## 2023-10-24 ENCOUNTER — Telehealth: Payer: Self-pay | Admitting: Neurology

## 2023-10-24 NOTE — Telephone Encounter (Signed)
 LVM and sent mychart msg informing pt of need to reschedule 01/12/24 appt - MD out  If patient calls back to reschedule you can offer an appointment at the end of August with Dr. Tresia Fruit

## 2023-10-30 ENCOUNTER — Ambulatory Visit
Admission: RE | Admit: 2023-10-30 | Discharge: 2023-10-30 | Disposition: A | Discharge status: Home / Self Care | Source: Ambulatory Visit | Attending: Neurology | Admitting: Neurology

## 2023-10-30 DIAGNOSIS — O2690 Pregnancy related conditions, unspecified, unspecified trimester: Secondary | ICD-10-CM

## 2023-10-30 DIAGNOSIS — M5416 Radiculopathy, lumbar region: Secondary | ICD-10-CM | POA: Diagnosis not present

## 2023-10-30 DIAGNOSIS — M5441 Lumbago with sciatica, right side: Secondary | ICD-10-CM | POA: Diagnosis not present

## 2023-10-30 DIAGNOSIS — G8929 Other chronic pain: Secondary | ICD-10-CM | POA: Diagnosis not present

## 2023-10-30 DIAGNOSIS — R29898 Other symptoms and signs involving the musculoskeletal system: Secondary | ICD-10-CM

## 2023-10-30 DIAGNOSIS — R200 Anesthesia of skin: Secondary | ICD-10-CM

## 2023-10-30 DIAGNOSIS — M5442 Lumbago with sciatica, left side: Secondary | ICD-10-CM | POA: Diagnosis not present

## 2023-11-02 ENCOUNTER — Encounter: Payer: Self-pay | Admitting: Neurology

## 2024-01-12 ENCOUNTER — Ambulatory Visit: Admitting: Neurology

## 2024-02-23 DIAGNOSIS — F909 Attention-deficit hyperactivity disorder, unspecified type: Secondary | ICD-10-CM | POA: Diagnosis not present

## 2024-02-23 DIAGNOSIS — F419 Anxiety disorder, unspecified: Secondary | ICD-10-CM | POA: Diagnosis not present

## 2024-04-20 DIAGNOSIS — F419 Anxiety disorder, unspecified: Secondary | ICD-10-CM | POA: Diagnosis not present
